# Patient Record
Sex: Male | Born: 1945 | Race: White | Hispanic: No | Marital: Married | Smoking: Former smoker
Health system: Southern US, Community
[De-identification: ages and names within clinical notes are randomized; demographics above are authoritative.]

## PROBLEM LIST (undated history)

## (undated) DIAGNOSIS — K625 Hemorrhage of anus and rectum: Secondary | ICD-10-CM

## (undated) DIAGNOSIS — R112 Nausea with vomiting, unspecified: Secondary | ICD-10-CM

## (undated) DIAGNOSIS — M1990 Unspecified osteoarthritis, unspecified site: Secondary | ICD-10-CM

## (undated) DIAGNOSIS — Z87442 Personal history of urinary calculi: Secondary | ICD-10-CM

## (undated) DIAGNOSIS — K219 Gastro-esophageal reflux disease without esophagitis: Secondary | ICD-10-CM

## (undated) DIAGNOSIS — I82409 Acute embolism and thrombosis of unspecified deep veins of unspecified lower extremity: Secondary | ICD-10-CM

## (undated) DIAGNOSIS — C4491 Basal cell carcinoma of skin, unspecified: Secondary | ICD-10-CM

## (undated) DIAGNOSIS — E785 Hyperlipidemia, unspecified: Secondary | ICD-10-CM

## (undated) DIAGNOSIS — Z9889 Other specified postprocedural states: Secondary | ICD-10-CM

## (undated) DIAGNOSIS — I10 Essential (primary) hypertension: Secondary | ICD-10-CM

## (undated) DIAGNOSIS — C61 Malignant neoplasm of prostate: Secondary | ICD-10-CM

## (undated) DIAGNOSIS — M87059 Idiopathic aseptic necrosis of unspecified femur: Secondary | ICD-10-CM

## (undated) DIAGNOSIS — D649 Anemia, unspecified: Secondary | ICD-10-CM

## (undated) HISTORY — PX: HERNIA REPAIR: SHX51

## (undated) HISTORY — PX: TONSILLECTOMY: SUR1361

## (undated) HISTORY — PX: COLONOSCOPY: SHX174

## (undated) NOTE — *Deleted (*Deleted)
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 Clinical nursing staff has updated patient's PMH/PSHx, current medication list, and drug allergies/intolerances to ensure comprehensive history available to assist in medical decision making as it pertains to the aforementioned surgical procedure and anticipated anesthetic course.   Clinical Discussion:  [name] is a 2 y.o. male who is submitted for pre-surgical anesthesia review and clearance prior to him undergoing the above procedure. Patient has never been a smoker. Pertinent PMH includes: HTN, HLD, DVT (on warfarin), anemia, GERD, prostate cancer (s/p XRT in 2018), OA, AVN (right hip).  Patient scheduled to undergo total hip arthroplasty on 09/20/2020 for treatment of AVN of the RIGHT femur and primary osteoarthritis.  Patient presented to PAT clinic on 09/20/2020 for preadmission/anesthesia testing.  During the course of his testing, patient had a twelve-lead ECG performed that showed a nonspecific IVCD with associated anterolateral ST and T wave abnormalities.  ECG was compared to last tracing on file from 10/13/2012, and these changes are new.  Patient advising that he has no previous cardiac history, and therefore has never seen a cardiologist; pertinent medical history as above.  Given the acute changes on his ECG, patient referral sent to [facility] cardiology for further evaluation and preoperative cardiac clearance. Additionally, patient makes mention of the fact that he is undergoing IVC filter placement on 09/23/2020 with Dr. Festus Barren (vascular surgery).  Communicated new ECG findings with vascular surgeon who advised that despite ECG changes, patient would be able to proceed a planned IVC filter placement without significant risk of complications; see previous notes.  Communication also sent to orthopedic surgeon Ernest Pine, MD) to make him aware of findings on ECG and subsequent referral to cardiology.   Patient underwent IVC filter placement on 09/23/2020.  In review of the procedural notes, device was placed with no complications and patient was discharged home in stable condition. Mr. Kotarski was also seen and initial consult by cardiology Darrold Junker, MD) on 09/23/2020; notes reviewed.  Patient denied angina or anginal equivalent symptoms.  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  Patient chronically anticoagulated with warfarin, and again, now has an IVC filter in place.  Patient is active, however does not exercise regularly.  Patient denies cardiac history.  Hypertension adequately  controlled on diuretic therapy (triamterene-HCTZ).  Both HTN and HLD are controlled with lifestyle/nutritional modifications.  Copy of ECG from PAT showing a nonspecific IVCD with associated ST-T wave changes reviewed by cardiologist; agreed with PAT interpretation.  Given the seemingly acute changes noted on the tracing, patient was scheduled for myocardial perfusion imaging to further assess.  Patient will follow up with cardiology following the scan to review results and discuss any potential interventions that may be needed.  Cardiac clearance for planned orthopedic surgery is pending results of the cardiovascular testing at this time.  Myocardial perfusion imaging study  has been scheduled for 09/26/2020. Communication was sent to surgeon's  APP Katrinka Blazing, PA-C) to update on patient status/condition, as APP will be seeing patient and clinic for preoperative evaluation on 09/24/2020.   Patient is followed by cardiology (***, MD). He was last seen in the cardiology clinic on ***; notes reviewed.  This patient is on daily anticoagulation therapy. He has been instructed on recommendations for holding his warfardin for 5 days prior to his procedure. The patient has been instructed that his last dose of his anticoagulant will be on 09/24/2020.  He denies previous perioperative complications with anesthesia. He underwent a general anesthetic course here (ASA II) in 06/2019 with no documented complications.   Vitals with BMI 09/23/2020 09/23/2020 09/23/2020  Height - - -  Weight - - -  BMI - - -  Systolic 139 138 191  Diastolic 66 72 62  Pulse 61 61 61    Providers/Specialists:   NOTE: Primary physician provider listed below. Patient may have been seen by APP or partner within same practice.   PROVIDER ROLE LAST OV  Hooten, Illene Labrador, MD Orthopedic (Surgeon) ***  Lynnea Ferrier, MD Primary Care Provider 04/04/2020  Marcina Millard, MD Cardiology ***   Allergies:  Codeine and Sulfa antibiotics  Current Home Medications:   No current facility-administered medications for this encounter.   . Cyanocobalamin 2500 MCG SUBL  . Glucosamine-Chondroitin (OSTEO BI-FLEX REGULAR STRENGTH PO)  . tamsulosin (FLOMAX) 0.4 MG CAPS capsule  . triamterene-hydrochlorothiazide (MAXZIDE-25) 37.5-25 MG tablet  . warfarin (COUMADIN) 4 MG tablet  . Multiple Vitamin (MULTIVITAMIN PO)   History:   Past Medical History:  Diagnosis Date  . Arthritis   . AVN of femur (HCC)    RIGHT  . DVT (deep venous thrombosis) (HCC)   . GERD (gastroesophageal reflux disease)   . Hyperlipemia   . Hypertension   . 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 Prostate cancer (HCC) 2018   Treated with radiation  . Rectal bleeding    2013, 2020   Past Surgical  History:  Procedure Laterality Date  . COLONOSCOPY N/A 07/02/2019   Procedure: COLONOSCOPY;  Surgeon: Toney Reil, MD;  Location: [facility] ENDOSCOPY;  Service: Gastroenterology;  Laterality: N/A;  . ESOPHAGOGASTRODUODENOSCOPY N/A 07/02/2019   Procedure: ESOPHAGOGASTRODUODENOSCOPY (EGD);  Surgeon: Toney Reil, MD;  Location: [facility] ENDOSCOPY;  Service: Gastroenterology;  Laterality: N/A;  . FOOT FRACTURE SURGERY Right 2012   Heel fracture repair/ has metal, fell off ladder  . HERNIA REPAIR  5/52005  . IVC FILTER INSERTION N/A 09/23/2020   Procedure: IVC FILTER INSERTION;  Surgeon: Annice Needy, MD;  Location: [facility] INVASIVE CV LAB;  Service: Cardiovascular;  Laterality: N/A;   Family History  Problem Relation Age of Onset  . Heart attack Father    Social History   Tobacco Use  . Smoking status: Never Smoker  . Smokeless tobacco: Never Used  Vaping Use  . Vaping Use: Never used  Substance Use Topics  . Alcohol use: Not Currently  . Drug use: Not Currently    Pertinent Clinical Results:  LABS: {CHL AN LABS REVIEWED:112001::"Labs reviewed: Acceptable for surgery."}  No visits with results within 3 Day(s) from this visit.  Latest known visit with results is:  Hospital Outpatient Visit on 09/20/2020  Component Date Value Ref Range Status  . CRP 09/20/2020 1.0* <1.0 mg/dL Final   Performed at [facility], [address] [place], [place] [address]  . Sed Rate 09/20/2020 5  0 - 20 mm/hr Final   Performed at [facility], [address] [place] [place], [place] [address]  . MRSA, PCR 09/20/2020 NEGATIVE  NEGATIVE Final  . Staphylococcus aureus 09/20/2020 NEGATIVE  NEGATIVE Final   Comment: (NOTE) The Xpert SA Assay (FDA approved for NASAL specimens in patients 91 years of age and older), is one component of a comprehensive surveillance program. It is not intended to diagnose infection nor to guide or monitor treatment. Performed at [facility], [address], [place], [place] [address]   . aPTT 09/20/2020 46* 24 - 36 seconds Final   Comment:        IF BASELINE aPTT IS ELEVATED, SUGGEST PATIENT RISK ASSESSMENT BE USED TO DETERMINE APPROPRIATE ANTICOAGULANT THERAPY. Performed at [facility], [address], [place], [place] [address]   . WBC 09/20/2020 5.0  4.0 - 10.5 K/uL Final  . RBC 09/20/2020 4.62  4.22 - 5.81 MIL/uL Final  . Hemoglobin 09/20/2020 14.2  13.0 - 17.0 g/dL Final  . HCT 24/40/1027 42.1  39 - 52 % Final  . MCV 09/20/2020 91.1  80.0 - 100.0 fL Final  . MCH 09/20/2020 30.7  26.0 - 34.0 pg Final  . MCHC 09/20/2020 33.7  30.0 - 36.0 g/dL Final  . RDW 25/36/6440 14.0  11.5 - 15.5 % Final  . Platelets 09/20/2020 228  150 - 400 K/uL Final  . nRBC 09/20/2020 0.0  0.0 - 0.2 % Final   Performed at [facility], [address] [place], [place] [address]  . Sodium 09/20/2020 140  135 - 145 mmol/L Final  . Potassium 09/20/2020 3.7  3.5 - 5.1 mmol/L Final  . Chloride 09/20/2020 106  98 - 111 mmol/L Final  . CO2 09/20/2020 27  22 - 32 mmol/L Final  . Glucose, Bld 09/20/2020 100* 70 - 99 mg/dL Final   Glucose reference range applies only to samples taken after fasting for at least 8 hours.  . BUN 09/20/2020 17  8 - 23 mg/dL Final  . Creatinine, Ser 09/20/2020 1.04  0.61 - 1.24 mg/dL Final  . Calcium 59/56/3875 9.2  8.9 - 10.3 mg/dL Final  . Total Protein 09/20/2020 6.9  6.5 - 8.1 g/dL Final  . Albumin 64/33/2951 4.2  3.5 - 5.0 g/dL Final  . AST 88/41/6606 19  15 - 41 U/L Final  . ALT 09/20/2020 15  0 - 44 U/L Final  . Alkaline Phosphatase 09/20/2020 58  38 - 126 U/L Final  . Total Bilirubin 09/20/2020 0.8  0.3 - 1.2 mg/dL Final  . GFR, Estimated 09/20/2020 >60  >60 mL/min Final   Comment: (NOTE) Calculated using the CKD-EPI Creatinine Equation (2021)   . Anion gap 09/20/2020 7  5 - 15 Final   Performed at [facility], [address] [place] [place], [place] [address]  . Prothrombin Time  09/20/2020 27.2* 11.4 - 15.2 seconds Final  . INR 09/20/2020 2.6* 0.8 - 1.2 Final   Comment: (NOTE) INR goal varies based on device and disease states. Performed at [facility], [address], [place], [place] [address]   . ABO/RH(D) 09/20/2020 O POS   Final  . Antibody Screen 09/20/2020 NEG   Final  . Sample Expiration 09/20/2020 10/04/2020,2359   Final  . Extend sample reason 09/20/2020    Final                   Value:NO TRANSFUSIONS OR PREGNANCY IN THE PAST 3 MONTHS Performed  at [facility], [address] [place], [place] [address]   . Color, Urine 09/20/2020 YELLOW* YELLOW Final  . APPearance 09/20/2020 CLEAR* CLEAR Final  . Specific Gravity, Urine 09/20/2020 1.018  1.005 - 1.030 Final  . pH 09/20/2020 7.0  5.0 - 8.0 Final  . Glucose, UA 09/20/2020 NEGATIVE  NEGATIVE mg/dL Final  . Hgb urine dipstick 09/20/2020 NEGATIVE  NEGATIVE Final  . Bilirubin Urine 09/20/2020 NEGATIVE  NEGATIVE Final  . Ketones, ur 09/20/2020 NEGATIVE  NEGATIVE mg/dL Final  . Protein, ur 08/65/7846 NEGATIVE  NEGATIVE mg/dL Final  . Nitrite 96/29/5284 NEGATIVE  NEGATIVE Final  . Glori Luis 09/20/2020 NEGATIVE  NEGATIVE Final   Performed at [facility], [address] [place], [place] [address]  . Specimen Description 09/20/2020    Final                   Value:URINE, RANDOM Performed at [facility], [address] [place] [place], [place] [address]   . Special Requests 09/20/2020    Final                   Value:Normal Performed at [facility], [address] [place] [place], [place] [address]   . Culture 09/20/2020    Final                   Value:NO GROWTH Performed at [facility], [address], [place], [place] [address]   . Report Status 09/20/2020 09/22/2020 FINAL   Final    ECG: Date: 09/20/2020 Time ECG obtained: 1445 PM Rate: 65 bpm Rhythm: NSR; nonspecific IVCD Intervals: PR 176 ms. QRS 136 ms. QTc 443 ms. ST segment and T wave  changes: Inferolateral ST-T wave abnormalities Comparison: Acute changes noted from last tracing performed on 10/13/2012   IMAGING / PROCEDURES: ECHOCARDIOGRAM done on *** LEXISCAN done on *** CT *** done on ***  Impression and Plan:  Asani A Rogalski has been referred for pre-anesthesia review and clearance prior to him undergoing the planned anesthetic and procedural courses. Available labs, pertinent testing, and imaging results were personally reviewed by me. This patient has been appropriately cleared by ***.   Based on clinical review performed today (09/24/20), barring any significant acute changes in the patient's overall condition, it is anticipated that he will be able to proceed with the planned surgical intervention. Any acute changes in clinical condition may necessitate his procedure being postponed and/or cancelled. Pre-surgical instructions were reviewed with the patient during his PAT appointment and questions were fielded by PAT clinical staff.  Quentin Mulling, MSN, APRN, FNP-C, CEN [facility]  Peri-operative Services Nurse Practitioner Phone: [phone] 09/24/20 9:00 AM  NOTE: This note has been prepared using Dragon dictation software. Despite my best ability to proofread, there is always the potential that unintentional transcriptional errors may still occur from this process.

---
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
## 2003-11-17 DIAGNOSIS — I82409 Acute embolism and thrombosis of unspecified deep veins of unspecified lower extremity: Secondary | ICD-10-CM

## 2003-11-17 HISTORY — PX: HERNIA REPAIR: SHX51

## 2003-11-17 HISTORY — DX: Acute embolism and thrombosis of unspecified deep veins of unspecified lower extremity: I82.409

## 2004-06-04 ENCOUNTER — Other Ambulatory Visit: Payer: Self-pay

## 2005-05-29 ENCOUNTER — Inpatient Hospital Stay: Payer: Self-pay | Admitting: Internal Medicine

## 2005-06-22 ENCOUNTER — Ambulatory Visit: Payer: Self-pay | Admitting: Urology

## 2006-01-14 IMAGING — US US EXTREM LOW VENOUS*L*
1 series · 18 of 23 positions shown · non-contrast
Comparison: none

REASON FOR EXAM: LL extremity pain. History of DVT, possible recurrence
COMMENTS:

PROCEDURE:     US  - US DOPPLER LOW EXTR LEFT  - June 22, 2005  [DATE]
RESULT:     There is noted flow throughout the deep venous system.  The
veins are compressible and respond to appropriate augmentation.  No clot is
identified.

[Series 1: us extrem low venous*left* · 18 of 23 slices shown]
[im 1/23]
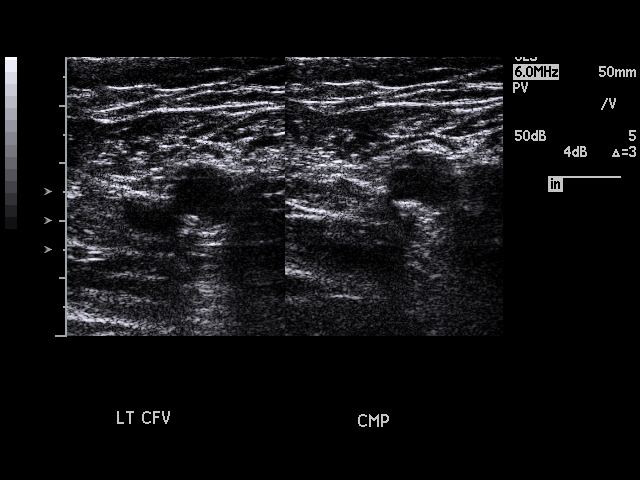
[im 2/23]
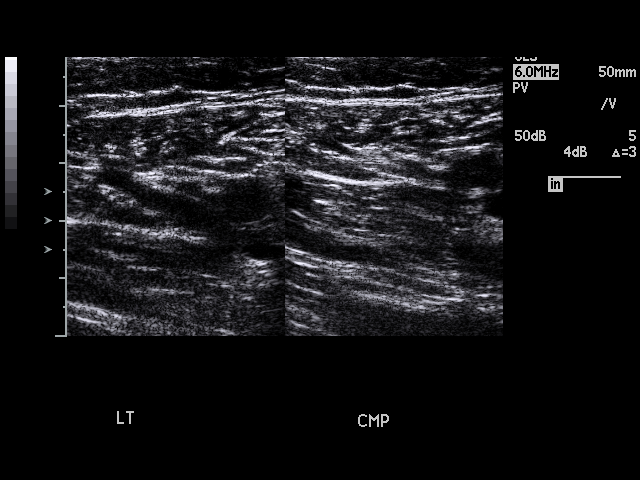
[im 4/23]
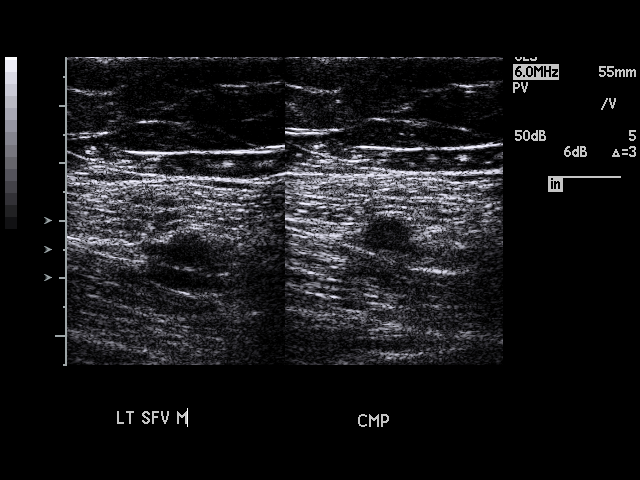
[im 5/23]
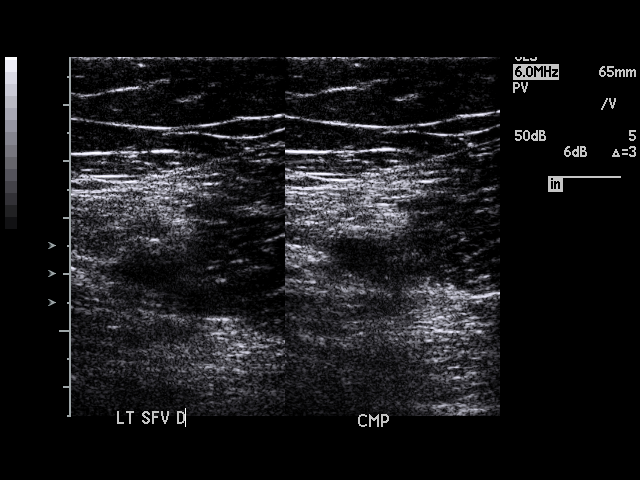
[im 6/23]
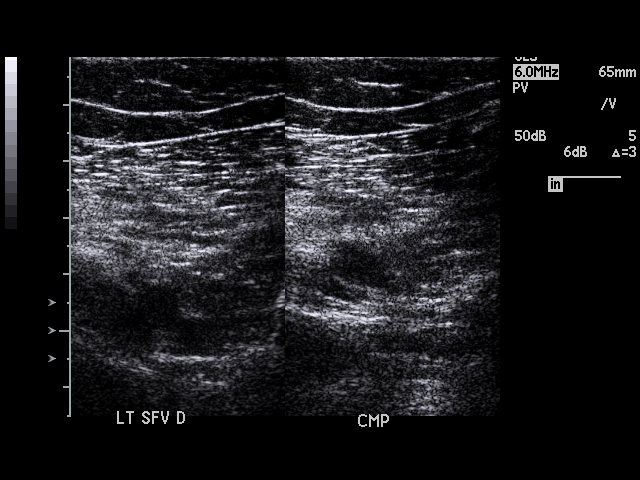
[im 8/23]
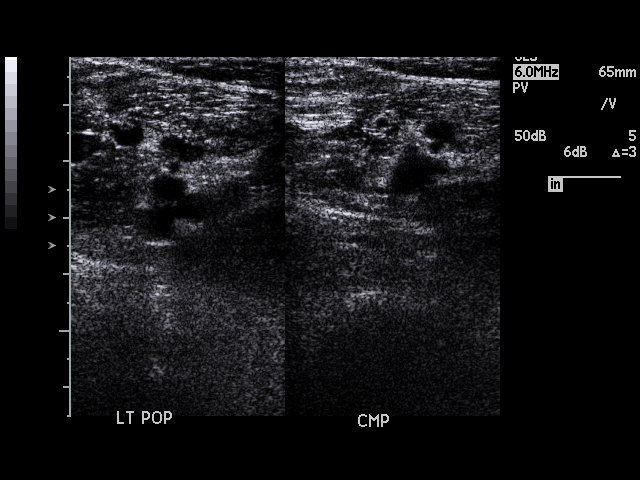
[im 9/23]
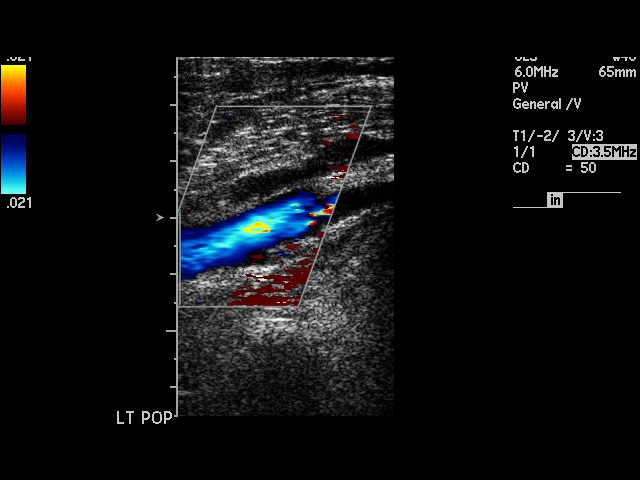
[im 10/23]
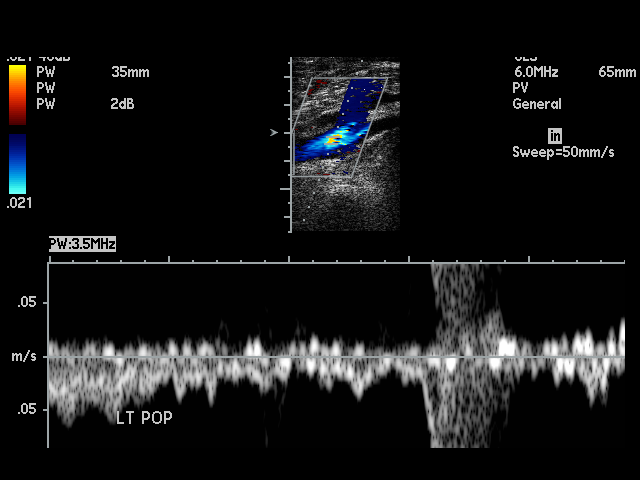
[im 11/23]
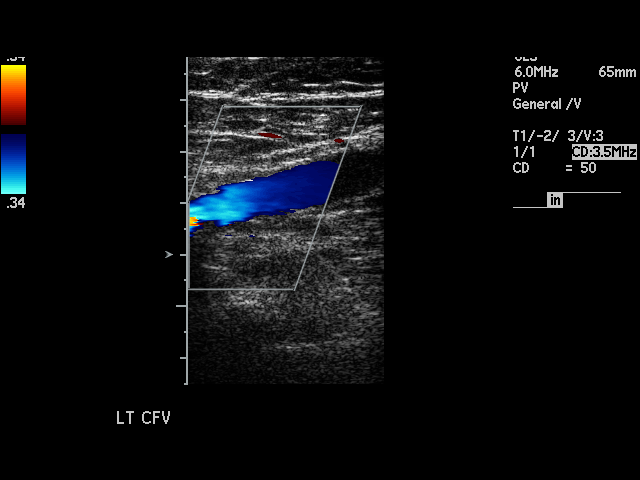
[im 13/23]
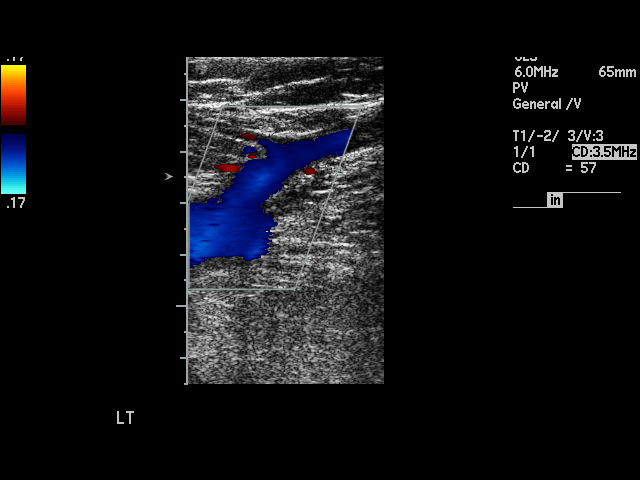
[im 14/23]
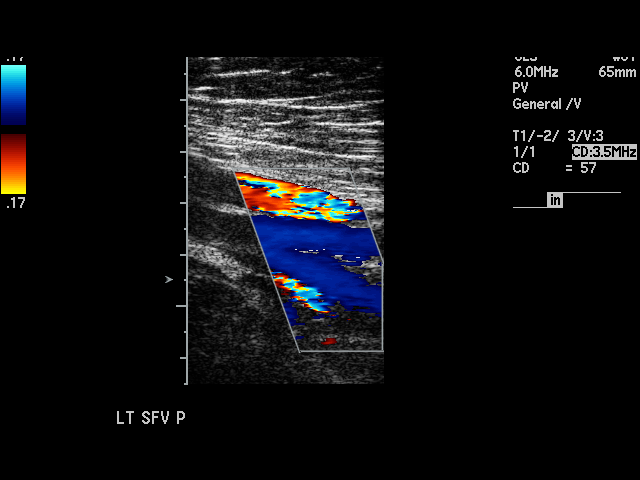
[im 15/23]
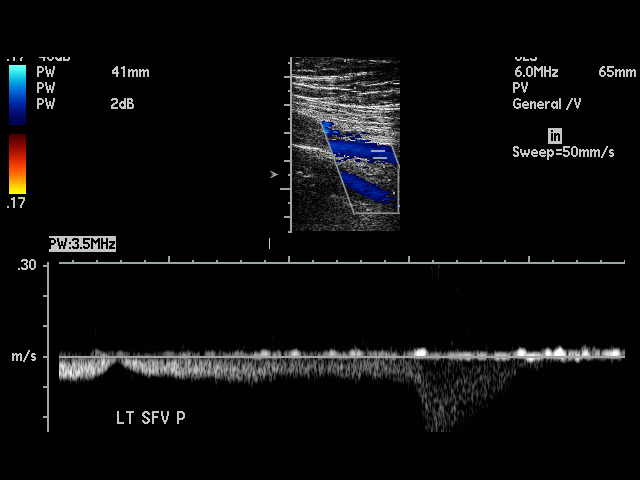
[im 16/23]
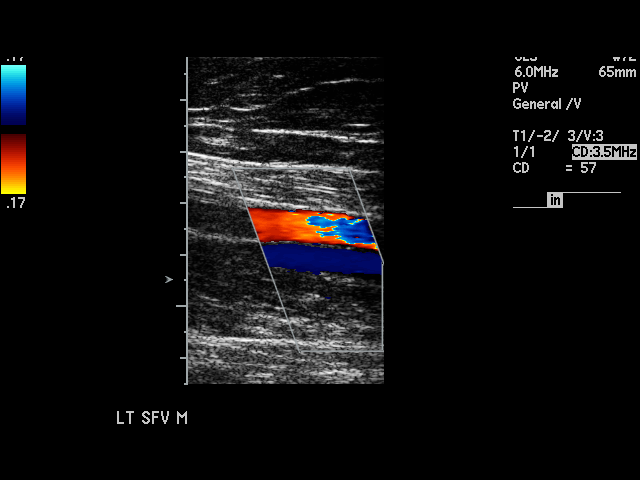
[im 18/23]
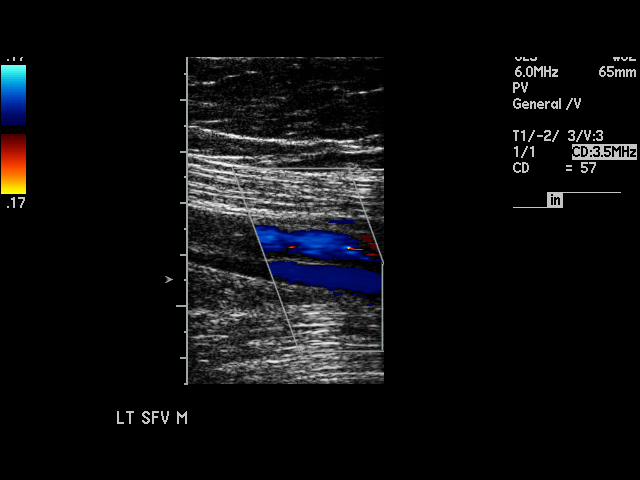
[im 19/23]
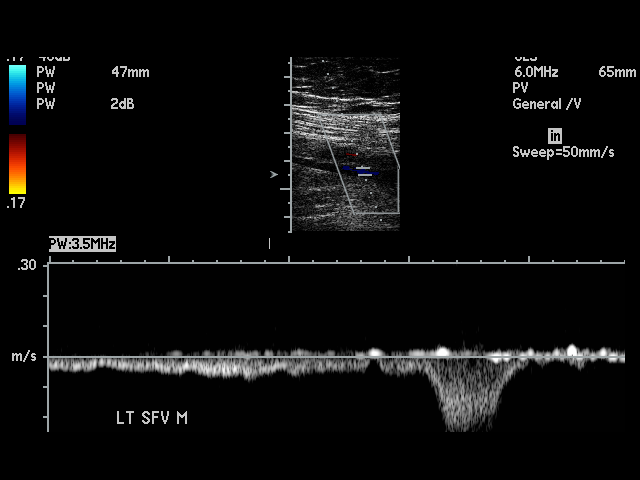
[im 20/23]
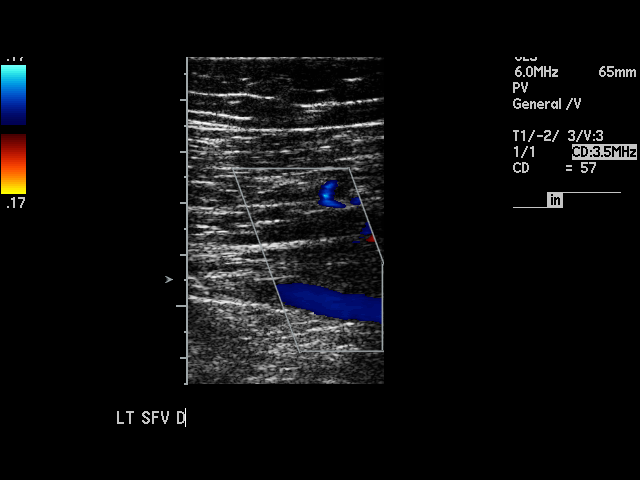
[im 22/23]
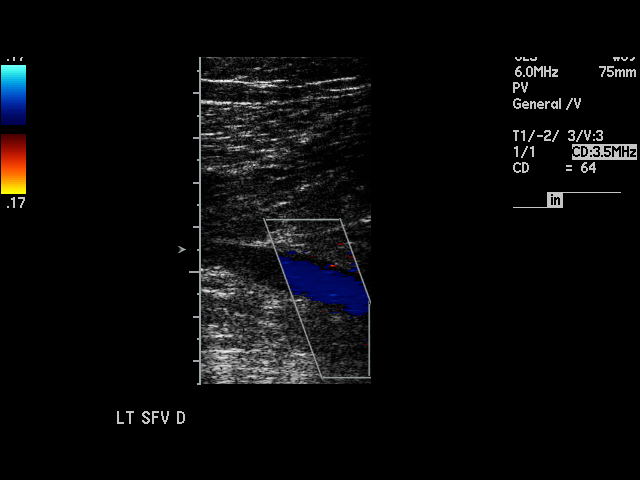
[im 23/23]
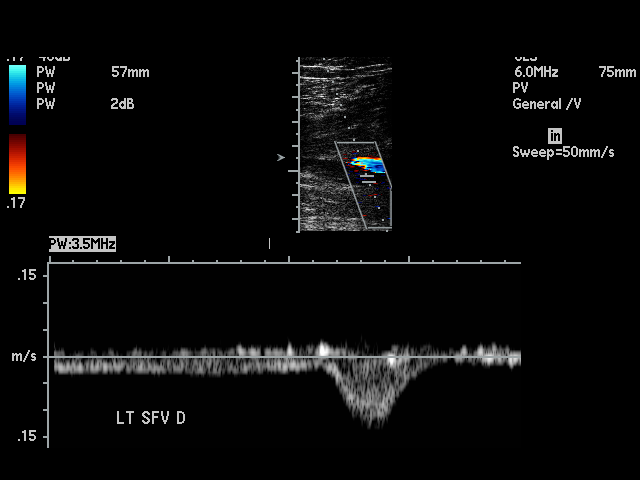

[18 of 23 positions shown; findings below may reference images not displayed]

IMPRESSION: No evidence of deep venous thrombosis noted in the LEFT lower extremity.

## 2008-05-07 ENCOUNTER — Ambulatory Visit: Payer: Self-pay | Admitting: Physician Assistant

## 2008-09-12 ENCOUNTER — Ambulatory Visit: Payer: Self-pay

## 2008-11-05 ENCOUNTER — Ambulatory Visit: Payer: Self-pay | Admitting: Family Medicine

## 2008-11-07 ENCOUNTER — Ambulatory Visit: Payer: Self-pay | Admitting: Family Medicine

## 2009-01-24 ENCOUNTER — Ambulatory Visit: Payer: Self-pay | Admitting: Internal Medicine

## 2010-06-11 ENCOUNTER — Ambulatory Visit: Payer: Self-pay | Admitting: Gastroenterology

## 2010-11-14 ENCOUNTER — Emergency Department: Payer: Self-pay | Admitting: Emergency Medicine

## 2010-11-16 HISTORY — PX: FOOT FRACTURE SURGERY: SHX645

## 2010-11-28 ENCOUNTER — Ambulatory Visit: Payer: Self-pay | Admitting: Podiatry

## 2011-11-17 HISTORY — PX: ESOPHAGOGASTRODUODENOSCOPY: SHX1529

## 2012-10-13 ENCOUNTER — Inpatient Hospital Stay: Payer: Self-pay

## 2012-10-13 LAB — COMPREHENSIVE METABOLIC PANEL
BUN: 28 mg/dL — ABNORMAL HIGH (ref 7–18)
Calcium, Total: 8.2 mg/dL — ABNORMAL LOW (ref 8.5–10.1)
Chloride: 110 mmol/L — ABNORMAL HIGH (ref 98–107)
Co2: 26 mmol/L (ref 21–32)
EGFR (African American): >60
Glucose: 102 mg/dL — ABNORMAL HIGH (ref 65–99)
Osmolality: 291 (ref 275–301)
SGOT(AST): 20 U/L (ref 15–37)
SGPT (ALT): 19 U/L (ref 12–78)
Sodium: 143 mmol/L (ref 136–145)
Total Protein: 6 g/dL — ABNORMAL LOW (ref 6.4–8.2)

## 2012-10-13 LAB — CBC
HCT: 35.1 % — ABNORMAL LOW (ref 40.0–52.0)
HGB: 11.5 g/dL — ABNORMAL LOW (ref 13.0–18.0)
MCH: 29.4 pg (ref 26.0–34.0)
MCV: 90 fL (ref 80–100)
RBC: 3.91 10*6/uL — ABNORMAL LOW (ref 4.40–5.90)
WBC: 7.1 10*3/uL (ref 3.8–10.6)

## 2012-10-13 LAB — URINALYSIS, COMPLETE
Bilirubin,UR: NEGATIVE
Ketone: NEGATIVE
Leukocyte Esterase: NEGATIVE
Nitrite: NEGATIVE
Ph: 6 (ref 4.5–8.0)
RBC,UR: 1 /HPF (ref 0–5)

## 2012-10-13 LAB — HEMOGLOBIN
HGB: 10.1 g/dL — ABNORMAL LOW (ref 13.0–18.0)
HGB: 10.1 g/dL — ABNORMAL LOW (ref 13.0–18.0)

## 2012-10-13 LAB — PROTIME-INR
INR: 2.1
Prothrombin Time: 24 secs — ABNORMAL HIGH (ref 11.5–14.7)

## 2012-10-13 LAB — APTT: Activated PTT: 41.6 secs — ABNORMAL HIGH (ref 23.6–35.9)

## 2012-10-14 LAB — CBC WITH DIFFERENTIAL/PLATELET
Basophil %: 1.1 %
Eosinophil #: 0.1 10*3/uL (ref 0.0–0.7)
Eosinophil %: 1.1 %
HGB: 9.6 g/dL — ABNORMAL LOW (ref 13.0–18.0)
Lymphocyte #: 2.7 10*3/uL (ref 1.0–3.6)
MCH: 31.4 pg (ref 26.0–34.0)
MCHC: 35.2 g/dL (ref 32.0–36.0)
MCV: 89 fL (ref 80–100)
Monocyte #: 0.5 x10 3/mm (ref 0.2–1.0)
Neutrophil #: 3.6 10*3/uL (ref 1.4–6.5)
Neutrophil %: 51.6 %
Platelet: 221 10*3/uL (ref 150–440)
RBC: 3.04 10*6/uL — ABNORMAL LOW (ref 4.40–5.90)
WBC: 6.9 10*3/uL (ref 3.8–10.6)

## 2012-10-14 LAB — BASIC METABOLIC PANEL
Anion Gap: 5 — ABNORMAL LOW (ref 7–16)
Calcium, Total: 8 mg/dL — ABNORMAL LOW (ref 8.5–10.1)
Creatinine: 0.91 mg/dL (ref 0.60–1.30)
EGFR (African American): >60
EGFR (Non-African Amer.): >60
Glucose: 93 mg/dL (ref 65–99)
Osmolality: 286 (ref 275–301)
Sodium: 143 mmol/L (ref 136–145)

## 2012-10-14 LAB — PROTIME-INR: INR: 1.2

## 2012-10-14 LAB — HEMOGLOBIN: HGB: 9.9 g/dL — ABNORMAL LOW (ref 13.0–18.0)

## 2012-10-15 LAB — CBC WITH DIFFERENTIAL/PLATELET
Basophil #: 0.1 10*3/uL (ref 0.0–0.1)
Basophil %: 1.3 %
Eosinophil %: 1.5 %
HCT: 27.3 % — ABNORMAL LOW (ref 40.0–52.0)
HGB: 9.1 g/dL — ABNORMAL LOW (ref 13.0–18.0)
Lymphocyte #: 3 10*3/uL (ref 1.0–3.6)
MCH: 29.8 pg (ref 26.0–34.0)
MCHC: 33.4 g/dL (ref 32.0–36.0)
MCV: 89 fL (ref 80–100)
Monocyte #: 0.5 x10 3/mm (ref 0.2–1.0)
Monocyte %: 6.6 %
Neutrophil #: 3.3 10*3/uL (ref 1.4–6.5)
Platelet: 224 10*3/uL (ref 150–440)
RBC: 3.06 10*6/uL — ABNORMAL LOW (ref 4.40–5.90)

## 2012-10-15 LAB — BASIC METABOLIC PANEL
Anion Gap: 7 (ref 7–16)
BUN: 13 mg/dL (ref 7–18)
Calcium, Total: 8.5 mg/dL (ref 8.5–10.1)
Chloride: 112 mmol/L — ABNORMAL HIGH (ref 98–107)
Creatinine: 0.97 mg/dL (ref 0.60–1.30)
EGFR (Non-African Amer.): >60
Osmolality: 287 (ref 275–301)
Potassium: 4.3 mmol/L (ref 3.5–5.1)
Sodium: 144 mmol/L (ref 136–145)

## 2012-10-28 ENCOUNTER — Ambulatory Visit: Payer: Self-pay | Admitting: Unknown Physician Specialty

## 2014-03-30 DIAGNOSIS — I80209 Phlebitis and thrombophlebitis of unspecified deep vessels of unspecified lower extremity: Secondary | ICD-10-CM | POA: Insufficient documentation

## 2015-03-05 NOTE — Discharge Summary (Signed)
PATIENT NAME:  [name] [name] MR#:  [id number] DATE OF BIRTH:  1946-10-05  DATE OF ADMISSION:  10/13/2012 DATE OF DISCHARGE:  10/15/2012   PRIMARY CARE PHYSICIAN: Ramonita Lab, MD   CONSULTING PHYSICIAN: Dr. Dionne Milo in GI   DISCHARGE DIAGNOSES:  1. Lower GI bleed, likely diverticular.  2. 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  3. Hypertension.  4. Benign prostatic hypertrophy.   HISTORY OF PRESENT ILLNESS: Please see admission history and physical from 10/13/2012. Basically he developed lower GI bleed with bright red blood per rectum. It was relatively painless and followed an episode of constipation. He was admitted. His INR was up at 2.1 due to the Coumadin he takes chronically. This was reversed with Vitamin K and he was admitted for further evaluation.  HOSPITAL COURSE BY ISSUES:  1. Lower GI bleed. He has a history of diverticulosis on prior colonoscopy in 2011. He was seen by Dr. Dionne Milo who recommended a bleeding scan. Bleeding scan was done on November 29th but it was negative as the bleeding had ceased. He has had no further bleeding since admission and his hemoglobin has stabilized. He is tolerating a full liquid diet at this point and is requesting discharge home.  2. History of DVT. His last DVT was five years ago. He has been on chronic anticoagulation since then. We discussed the risks and benefits and we will hold his Coumadin at this point given the risk of the GI bleed versus the risk of recurrent DVT. His discharge INR was 1.2. He will be mobilized in order to prevent recurrent DVTs. He knows to return if he has recurrent leg pain or swelling or if he has any further bright red blood per rectum.   PROCEDURES: Only procedure done was the bleeding scan which was negative for active bleeding.   LABS: Discharge white count 7.0, hemoglobin 9.1 which is down from 11.5, platelets 224. Discharge INR 1.2.   DISCHARGE INSTRUCTIONS: The patient is to call the hospital  on call line or go to the ED if he has recurrent bleeding, abdominal pain, dizziness, blood clot, swelling in the legs, chest pain, or other concerning symptoms.   DISCHARGE FOLLOW-UP: The patient will follow-up with Dr. Caryl Comes in 1 to 2 weeks to have his hemoglobin checked as well as to have discussion about whether to restart the Coumadin.   DISCHARGE MEDICATIONS: The patient will resume his outpatient medications including:   1. Flomax 0.4 mg once a day.  2. Avodart 0.5 mg once a day.  3. Imipramine 25 mg once a day.  4. Multivitamin once a day.  5. Osteo Bi-Flex 2 times a day.  6. He was also instructed to get over-the-counter iron tablets and start those. He is to make sure his stool stay soft and can use a stool softener as needed.   DO NOT TAKE: He will stop the warfarin until follow-up.   DISCHARGE DIET: Regular.   DISCHARGE ACTIVITY: As tolerated.   DISCHARGE TIME: This discharge took 35 minutes.   ____________________________ Cheral Marker. Ola Spurr, MD dpf:drc D: 10/15/2012 11:14:32 ET T: 10/15/2012 11:32:59 ET JOB#: [id number]  cc: Cheral Marker. Ola Spurr, MD, <Dictator> Tama High III, MD [facility] MD ELECTRONICALLY SIGNED 10/18/2012 21:44

## 2015-03-05 NOTE — H&P (Signed)
PATIENT NAME:  [name] [name], [name] [name] MR#:  [id number] DATE OF BIRTH:  Mar 18, 1946  DATE OF ADMISSION:  10/13/2012  REFERRING PHYSICIAN: Pollie Friar, MD   PRIMARY CARE PHYSICIAN: Ramonita Lab, MD    CHIEF COMPLAINT: Lower GI bleed.   HISTORY OF PRESENT ILLNESS: The patient is a pleasant 69 year old Caucasian male with history of BPH, history of DVT x2. Initial DVT was three days postop after a hernia surgery around 2005. The second DVT was in the same the lower extremity around 2007. Since then he has been on chronic Coumadin therapy. The patient presented with acute lower GI bleed starting about 5 a.m. this morning. The patient has some chronic constipation. Of note, the patient has had a colonoscopy per him in 2011. There is a history of diverticulosis. The patient woke up early this morning, had to go use the bathroom and was straining. He had two bowel movements but there was no light and he did not see any bleed. Afterwards the patient had three additional bowel movements all bloody. It is unclear if he had bloody bowel movement with the first two episodes. He presented to the hospital where he had another episode of bloody bowel movement here. Currently he is not tachycardic. There is no acute bleed now. His INR is therapeutic at 2.1. PT and PTT is elevated as well. He has no abdominal pain. No fevers. He has no upper GI bleed. Hospitalist services were contacted for further evaluation and management. Dr. Dionne Milo from GI has also been consulted and he is aware of the patient. A Nuclear Medicine study has been ordered but it is questionable if we have adequate contrast here in-house today.   PAST MEDICAL HISTORY:  1. Gastroesophageal reflux disease.   2. Degenerative disk disease. 3. Skin cancer.  4. History of DVT as above.  5. BPH. 6. Right heel surgery.   OUTPATIENT MEDICATIONS:  1. Avodart 0.5 mg daily.  2. Flomax 0.4 mg daily.   3. Imipramine 25 mg daily.  4. Multivitamin 1 tab daily.  5. Osteo  Bi-Flex 2 times a day.  6. Warfarin 6 mg once a day.   FAMILY HISTORY: Mom died of old age, had some abdominal surgeries, died at the age of 16. Dad with heart attack.   ALLERGIES: Codeine causes nausea.   SOCIAL HISTORY: No tobacco, alcohol, or drug use.  REVIEW OF SYSTEMS: CONSTITUTIONAL: No fever. Positive for fatigue while he was having a bowel movement, resolved now. No weight changes. EYES: No blurry vision or double vision. ENT: No tinnitus or hearing loss. Had some sinus drainage recently. RESPIRATORY: No cough, wheezing, shortness of breath, or painful respiration. CARDIOVASCULAR: No chest pain, orthopnea, arrhythmia, palpitations, or syncope. GI: No nausea, vomiting, or diarrhea. Positive for abdominal pain earlier in the morning on the left side, currently resolved. Positive for GI bleed as above. No history of prior GI bleeds. GU: No dysuria or hematuria. Positive for BPH. ENDOCRINE: No polyuria, nocturia, or thyroid problems. HEME/LYMPH: No anemia history or easy bruising. SKIN: No new rashes. Chronic venous changes and darkened skin on the left leg. MUSCULOSKELETAL: Denies arthritis currently. Has history of degenerative disk disease. NEUROLOGIC: No numbness or weakness focally. PSYCHIATRIC: No anxiety or insomnia.   PHYSICAL EXAMINATION:   VITAL SIGNS: Temperature on arrival 97.8, pulse rate 81, respiratory rate 20, blood pressure on arrival 132/67, last one 141/75, oxygen sat 99% on room air.   GENERAL: The patient is a well developed Caucasian male laying in bed in  no obvious distress talking in full sentences.   HEENT: Normocephalic, atraumatic. Pupils are equal and reactive. Anicteric sclerae. Moist mucous membranes.   NECK: Supple. No thyroid tenderness. No cervical lymphadenopathy.   CARDIOVASCULAR: S1, S2 regular rate and rhythm. No murmurs, rubs, or gallops.   LUNGS: Clear to auscultation. No wheezing or rhonchi. Good effort.   ABDOMEN: Soft, nontender. No organomegaly  appreciated. Positive bowel sounds in all quadrants. No significant tenderness on palpation.   EXTREMITIES: No significant lower extremity edema.   SKIN: Some chronic venostasis changes on the left lower extremity.   NEUROLOGICAL: Cranial nerves II through XII grossly intact. Strength 5/5 in all extremities. Sensation intact to light touch.   PSYCH: Awake, alert, oriented x3. Pleasant, cooperative, conversant.   LABORATORY, DIAGNOSTIC, AND RADIOLOGICAL DATA: BUN 28, creatinine 1.08, sodium 143, potassium 4.3, chloride 110, glucose 102. LFTs total protein is 6, otherwise within normal limits. WBC 7, hemoglobin 11.5, hematocrit 35.1, platelets 251. INR 2.1. PT 24. APTT 41.6. Urinalysis not suggestive of infection.   EKG normal sinus rhythm, rate is 80, nonspecific T wave changes.   ASSESSMENT AND PLAN: We have a pleasant 69 year old male with history of DVT x2, the first provoked in the setting of surgery, second one unprovoked, last DVT was in 2007, presents with acute lower GI bleed and short episode of abdominal pain, now resolved. The patient has had multiple episodes of bleeding, the last one of which happened in the ER and was documented. The patient also has pictures of his bloody bowel movement. At this point he is hemodynamically stable, not tachycardic. He is not actively bleeding. Would admit the patient to the hospital. We would hold the Coumadin and give a dose of Vitamin K 2.5 mg p.o. as last DVT was over five years ago. Would also consider FFP. The cause of this likely is diverticular bleed. He has had a colonoscopy in 2011 which showed diverticulosis in the sigmoid colon. GI is on board. Would see if the Nuclear Medicine bleeding scan could be done. Would consult Vascular Surgery if the bleeding is recurrent as well. Would start patient on a clear diet and check hemoglobin every six hours at this point. I have already obtained consent and explained the risks and benefits of blood  transfusion and he is aware and he has signed the consent form. Would continue his BPH medications. Start him on SCDs and TEDs for DVT prophylaxis.     CODE STATUS: The patient is FULL CODE.   ____________________________ Vivien Presto, MD sa:drc D: 10/13/2012 11:03:35 ET T: 10/13/2012 11:29:23 ET JOB#: [id number]  cc: Vivien Presto, MD, <Dictator> Adin Hector, MD Vivien Presto MD ELECTRONICALLY SIGNED 10/27/2012 11:12

## 2015-03-05 NOTE — Consult Note (Signed)
Chief Complaint:   Subjective/Chief Complaint No bleeding or BM today. H and H stable after initial drop. Bleeding scan is negative.   VITAL SIGNS/ANCILLARY NOTES: **Vital Signs.:   29-Nov-13 13:49   Vital Signs Type Routine   Temperature Temperature (F) 97.8   Celsius 36.5   Temperature Source Oral   Pulse Pulse 85   Respirations Respirations 18   Systolic BP Systolic BP 314   Diastolic BP (mmHg) Diastolic BP (mmHg) 75   Mean BP 93   Pulse Ox % Pulse Ox % 99   Pulse Ox Activity Level  At rest   Oxygen Delivery Room Air/ 21 %   Lab Results: Routine Chem:  29-Nov-13 04:07    Glucose, Serum 93   BUN 16   Creatinine (comp) 0.91   Sodium, Serum 143   Potassium, Serum 4.1   Chloride, Serum  112   CO2, Serum 26   Calcium (Total), Serum  8.0   Anion Gap  5   Osmolality (calc) 286   eGFR (African American) >60   eGFR (Non-African American) >60 (eGFR values <44m/min/1.73 m2 may be an indication of chronic kidney disease (CKD). Calculated eGFR is useful in patients with stable renal function. The eGFR calculation will not be reliable in acutely ill patients when serum creatinine is changing rapidly. It is not useful in  patients on dialysis. The eGFR calculation may not be applicable to patients at the low and high extremes of body sizes, pregnant women, and vegetarians.)  Routine Coag:  29-Nov-13 04:07    Prothrombin  15.9   INR 1.2 (INR reference interval applies to patients on anticoagulant therapy. A single INR therapeutic range for coumarins is not optimal for all indications; however, the suggested range for most indications is 2.0 - 3.0. Exceptions to the INR Reference Range may include: Prosthetic heart valves, acute myocardial infarction, prevention of myocardial infarction, and combinations of aspirin and anticoagulant. The need for a higher or lower target INR must be assessed individually. Reference: The Pharmacology and Management of the Vitamin K   antagonists: the seventh ACCP Conference on Antithrombotic and Thrombolytic Therapy. CHFWYO.3785Sept:126 (3suppl): [id number] A HCT value >55% may artifactually increase the PT.  In one study,  the increase was an average of 25%. Reference:  "Effect on Routine and Special Coagulation Testing Values of Citrate Anticoagulant Adjustment in Patients with High HCT Values." American Journal of Clinical Pathology 2006;126:400-405.)  Routine Hem:  29-Nov-13 04:07    Hemoglobin (CBC)  9.6   WBC (CBC) 6.9   RBC (CBC)  3.04   Hematocrit (CBC)  27.2   Platelet Count (CBC) 221   MCV 89   MCH 31.4   MCHC 35.2   RDW  14.8   Neutrophil % 51.6   Lymphocyte % 39.6   Monocyte % 6.6   Eosinophil % 1.1   Basophil % 1.1   Neutrophil # 3.6   Lymphocyte # 2.7   Monocyte # 0.5   Eosinophil # 0.1   Basophil # 0.1 (Result(s) reported on 14 Oct 2012 at 06:04AM.)    15:42    Hemoglobin (CBC)  9.9 (Result(s) reported on 14 Oct 2012 at 04:19PM.)   Assessment/Plan:  Assessment/Plan:   Assessment Diverticular bleed, resolved. Mild blood loss anemia. High INR is now normal.    Plan Continue to hold Coumadin for 10-14 days, longer if clinically acceptable. Full liquid diet.  If no further bleeding may advance to soft diet and DC home probably tomorrow. If remains  in the hospital, Dr. Vira Agar will follow over the weekend.   Electronic Signatures: Jill Side (MD)  (Signed [phone] 17:20)  Authored: Chief Complaint, VITAL SIGNS/ANCILLARY NOTES, Lab Results, Assessment/Plan   Last Updated: 29-Nov-13 17:20 by Jill Side (MD)

## 2015-03-05 NOTE — Consult Note (Signed)
CC: LGI bleeding.  No further bleeding or bowel movement.  I advised pt to stay on special diet of "toothless diet" to lessen chance of stool knocking off clot from whatever diverticulum that bled.  Explained to him he may see dark stools for a few days even with a little deep marroon colon.  Avoid all NSAID. Follow up as needed, had colonoscopy 2 years ago.  Electronic Signatures: Manya Silvas (MD)  (Signed on 30-Nov-13 11:41)  Authored  Last Updated: 09-KHV-74 11:41 by Manya Silvas (MD)

## 2015-03-05 NOTE — Consult Note (Signed)
Brief Consult Note: Diagnosis: Lower GI bleed most likely diverticular.   Patient was seen by consultant.   Comments: Probably diverticular bleed. Clinically stable and no signs of active bleeding at this time. Mild coagulopathy.  Recommendations: Clear liquid diet. Follow H and H If signs of significant active bleeding consider FFP. Will follow.  Electronic Signatures: Jill Side (MD)  (Signed [phone] 12:49)  Authored: Brief Consult Note   Last Updated: 28-Nov-13 12:49 by Jill Side (MD)

## 2015-03-05 NOTE — Consult Note (Signed)
PATIENT NAME:  [name] [name], [name] [name] MR#:  [id number] DATE OF BIRTH:  May 24, 1946  DATE OF CONSULTATION:  10/14/2012  CONSULTING PHYSICIAN:  Jill Side, MD PRIMARY CARE PHYSICIAN: Ramonita Lab, III, MD  REASON FOR CONSULTATION: Lower gastrointestinal bleed.   HISTORY OF PRESENT ILLNESS: A 69 year old male with history of diverticulosis diagnosed on a colonoscopy in 2011. The patient was apparently diagnosed with deep vein thrombosis in 2005 and was treated with Coumadin. He had a relapse of deep vein thrombosis in 2007, and at that time he was started on Coumadin again and was told to take the Coumadin for the rest of his life. The patient was admitted yesterday after he went to the bathroom early morning and had 4 or 5 loose bowel movements which were bloody. Came to the emergency room. He was not tachycardic or hypertensive. INR was 2.1, and hemoglobin was somewhat low at 11.5. The patient was evaluated yesterday as well as today. He has had no further bleeding since admission. His hemoglobin dropped down from 11.5 to 9.6 and is now 9.9. Denies abdominal pain, nausea, vomiting, hematemesis, or any other significant symptoms.   PAST MEDICAL HISTORY:  1. GERD. 2. Degenerative disk disease. 3. Skin cancer. 4. History of deep vein thrombosis. 5. Benign prostatic hypertrophy. 6. Right heel surgery.  OUTPATIENT MEDICATIONS:   1. Avodart. 2. Flomax. 3. Imipramine.  4. Multivitamin.  5. Coumadin 6 mg a day.   FAMILY HISTORY: Unremarkable.   ALLERGIES: Codeine.   SOCIAL HISTORY: Does not smoke or drink.   REVIEW OF SYSTEMS: Unremarkable except for what is mentioned in the History of Present Illness.   PHYSICAL EXAMINATION:  GENERAL: Well built male, does not appear to be in any acute distress. He has been hemodynamically stable since admission without tachycardia or hypertension.   VITAL SIGNS: Temperature 97.8.   NECK: Neck veins flat.   LUNGS: Clear to auscultation bilaterally with fair  air entry.   CARDIOVASCULAR: Regular rate and rhythm.   ABDOMEN: Quite soft and benign, nontender, nondistended. No rebound or guarding was noted.   NEUROLOGIC: Examination appears to be unremarkable.   LABORATORIES: INR was 2.1 on admission, is 1.2 today. Hemoglobin was 11.5 on admission, dropped down to 9.6 and is 9.9 today. The rest of the CBC is unremarkable. Electrolytes are fine. BUN is slightly elevated at 28, creatinine is normal. Nuclear medicine bleeding scan was done and was unremarkable as well.   ASSESSMENT AND PLAN: Patient with probable diverticular bleed on Coumadin. He has no further signs of gastrointestinal bleed since admission and his hemoglobin and hematocrit remain stable. His INR is now down to 1.2 after withholding his Coumadin. I would recommend continuing to hold the Coumadin for at least 10 to 14 days, longer if clinically acceptable. We will advance from clear liquid to full liquid diet. If no signs of bleeding in the next 20 to 24 hours and hemoglobin and hematocrit remain stable, then patient can probably be discharged home. If the patient remains in the hospital, he will be seen by Dr. Vira Agar over the weekend.  ____________________________ Jill Side, MD si:vtd D: 10/14/2012 17:26:06 ET T: 10/15/2012 09:09:53 ET  JOB#: [id number] cc: Jill Side, MD, <Dictator> Jill Side MD ELECTRONICALLY SIGNED 10/19/2012 11:25

## 2016-01-08 ENCOUNTER — Other Ambulatory Visit: Payer: Self-pay | Admitting: Internal Medicine

## 2016-01-08 DIAGNOSIS — I824Y2 Acute embolism and thrombosis of unspecified deep veins of left proximal lower extremity: Secondary | ICD-10-CM

## 2016-01-09 ENCOUNTER — Ambulatory Visit
Admission: RE | Admit: 2016-01-09 | Discharge: 2016-01-09 | Disposition: A | Discharge status: Home / Self Care | Payer: PPO | Source: Ambulatory Visit | Attending: Internal Medicine | Admitting: Internal Medicine

## 2016-01-09 DIAGNOSIS — M7989 Other specified soft tissue disorders: Secondary | ICD-10-CM | POA: Diagnosis present

## 2016-01-09 DIAGNOSIS — I824Y2 Acute embolism and thrombosis of unspecified deep veins of left proximal lower extremity: Secondary | ICD-10-CM

## 2016-11-16 DIAGNOSIS — C61 Malignant neoplasm of prostate: Secondary | ICD-10-CM

## 2016-11-16 HISTORY — DX: Malignant neoplasm of prostate: C61

## 2016-11-24 DIAGNOSIS — C61 Malignant neoplasm of prostate: Secondary | ICD-10-CM | POA: Insufficient documentation

## 2016-11-26 DIAGNOSIS — Z8546 Personal history of malignant neoplasm of prostate: Secondary | ICD-10-CM | POA: Insufficient documentation

## 2017-09-30 DIAGNOSIS — M87051 Idiopathic aseptic necrosis of right femur: Secondary | ICD-10-CM | POA: Insufficient documentation

## 2019-06-30 ENCOUNTER — Observation Stay: Payer: Medicare HMO

## 2019-06-30 ENCOUNTER — Encounter: Payer: Self-pay | Admitting: Emergency Medicine

## 2019-06-30 ENCOUNTER — Other Ambulatory Visit: Payer: Self-pay

## 2019-06-30 ENCOUNTER — Observation Stay
Admission: EM | Admit: 2019-06-30 | Discharge: 2019-07-02 | Disposition: A | Discharge status: Home / Self Care | Payer: Medicare HMO | Attending: Internal Medicine | Admitting: Internal Medicine

## 2019-06-30 DIAGNOSIS — D120 Benign neoplasm of cecum: Secondary | ICD-10-CM | POA: Insufficient documentation

## 2019-06-30 DIAGNOSIS — I10 Essential (primary) hypertension: Secondary | ICD-10-CM | POA: Diagnosis not present

## 2019-06-30 DIAGNOSIS — K5730 Diverticulosis of large intestine without perforation or abscess without bleeding: Secondary | ICD-10-CM | POA: Insufficient documentation

## 2019-06-30 DIAGNOSIS — K627 Radiation proctitis: Secondary | ICD-10-CM | POA: Insufficient documentation

## 2019-06-30 DIAGNOSIS — Z20828 Contact with and (suspected) exposure to other viral communicable diseases: Secondary | ICD-10-CM | POA: Insufficient documentation

## 2019-06-30 DIAGNOSIS — K2950 Unspecified chronic gastritis without bleeding: Secondary | ICD-10-CM | POA: Insufficient documentation

## 2019-06-30 DIAGNOSIS — R140 Abdominal distension (gaseous): Secondary | ICD-10-CM | POA: Insufficient documentation

## 2019-06-30 DIAGNOSIS — R1013 Epigastric pain: Secondary | ICD-10-CM | POA: Diagnosis not present

## 2019-06-30 DIAGNOSIS — Z8546 Personal history of malignant neoplasm of prostate: Secondary | ICD-10-CM | POA: Insufficient documentation

## 2019-06-30 DIAGNOSIS — Z86718 Personal history of other venous thrombosis and embolism: Secondary | ICD-10-CM | POA: Insufficient documentation

## 2019-06-30 DIAGNOSIS — Z923 Personal history of irradiation: Secondary | ICD-10-CM | POA: Insufficient documentation

## 2019-06-30 DIAGNOSIS — K625 Hemorrhage of anus and rectum: Secondary | ICD-10-CM | POA: Diagnosis present

## 2019-06-30 DIAGNOSIS — Z7901 Long term (current) use of anticoagulants: Secondary | ICD-10-CM | POA: Diagnosis not present

## 2019-06-30 HISTORY — DX: Essential (primary) hypertension: I10

## 2019-06-30 HISTORY — DX: Acute embolism and thrombosis of unspecified deep veins of unspecified lower extremity: I82.409

## 2019-06-30 LAB — SARS CORONAVIRUS 2 BY RT PCR (HOSPITAL ORDER, PERFORMED IN ~~LOC~~ HOSPITAL LAB): SARS Coronavirus 2: NEGATIVE

## 2019-06-30 LAB — COMPREHENSIVE METABOLIC PANEL
ALT: 14 U/L (ref 0–44)
AST: 20 U/L (ref 15–41)
Albumin: 4.6 g/dL (ref 3.5–5.0)
Alkaline Phosphatase: 53 U/L (ref 38–126)
Anion gap: 9 (ref 5–15)
BUN: 19 mg/dL (ref 8–23)
CO2: 27 mmol/L (ref 22–32)
Calcium: 9.2 mg/dL (ref 8.9–10.3)
Chloride: 102 mmol/L (ref 98–111)
Creatinine, Ser: 1.05 mg/dL (ref 0.61–1.24)
GFR calc Af Amer: >60 mL/min (ref >60)
GFR calc non Af Amer: >60 mL/min (ref >60)
Glucose, Bld: 103 mg/dL — ABNORMAL HIGH (ref 70–99)
Potassium: 4.1 mmol/L (ref 3.5–5.1)
Sodium: 138 mmol/L (ref 135–145)
Total Bilirubin: 1 mg/dL (ref 0.3–1.2)
Total Protein: 7 g/dL (ref 6.5–8.1)

## 2019-06-30 LAB — TYPE AND SCREEN
ABO/RH(D): O POS
Antibody Screen: NEGATIVE

## 2019-06-30 LAB — CBC
HCT: 37.8 % — ABNORMAL LOW (ref 39.0–52.0)
Hemoglobin: 12.2 g/dL — ABNORMAL LOW (ref 13.0–17.0)
MCH: 29.3 pg (ref 26.0–34.0)
MCHC: 32.3 g/dL (ref 30.0–36.0)
MCV: 90.6 fL (ref 80.0–100.0)
Platelets: 241 10*3/uL (ref 150–400)
RBC: 4.17 MIL/uL — ABNORMAL LOW (ref 4.22–5.81)
RDW: 13.9 % (ref 11.5–15.5)
WBC: 6.1 10*3/uL (ref 4.0–10.5)
nRBC: 0 % (ref 0.0–0.2)

## 2019-06-30 LAB — IRON AND TIBC
Iron: 81 ug/dL (ref 45–182)
Saturation Ratios: 23 % (ref 17.9–39.5)
TIBC: 356 ug/dL (ref 250–450)
UIBC: 275 ug/dL

## 2019-06-30 LAB — PROTIME-INR
INR: 1.8 — ABNORMAL HIGH (ref 0.8–1.2)
Prothrombin Time: 20.2 seconds — ABNORMAL HIGH (ref 11.4–15.2)

## 2019-06-30 LAB — FERRITIN: Ferritin: 30 ng/mL (ref 24–336)

## 2019-06-30 LAB — FOLATE: Folate: 15.5 ng/mL (ref >5.9)

## 2019-06-30 MED ORDER — PANTOPRAZOLE SODIUM 40 MG IV SOLR
40.0000 mg | Freq: Once | INTRAVENOUS | Status: AC
  Administered 2019-06-30: 40 mg via INTRAVENOUS
  Filled 2019-06-30: qty 40

## 2019-06-30 MED ORDER — TAMSULOSIN HCL 0.4 MG PO CAPS
0.4000 mg | ORAL_CAPSULE | Freq: Every day | ORAL | Status: DC
  Administered 2019-07-01: 0.4 mg via ORAL
  Filled 2019-06-30: qty 1

## 2019-06-30 MED ORDER — TRIAMTERENE-HCTZ 37.5-25 MG PO TABS
1.0000 | ORAL_TABLET | Freq: Every day | ORAL | Status: DC
  Administered 2019-07-01: 1 via ORAL
  Filled 2019-06-30 (×2): qty 1

## 2019-06-30 MED ORDER — ACETAMINOPHEN 650 MG RE SUPP: 650.0000 mg | Freq: Four times a day (QID) | RECTAL | Status: DC | PRN

## 2019-06-30 MED ORDER — POLYETHYLENE GLYCOL 3350 17 G PO PACK: 17.0000 g | PACK | Freq: Every day | ORAL | Status: DC | PRN

## 2019-06-30 MED ORDER — ONDANSETRON HCL 4 MG PO TABS: 4.0000 mg | ORAL_TABLET | Freq: Four times a day (QID) | ORAL | Status: DC | PRN

## 2019-06-30 MED ORDER — IOHEXOL 240 MG/ML SOLN
50.0000 mL | Freq: Once | INTRAMUSCULAR | Status: AC
  Administered 2019-06-30: 50 mL via ORAL

## 2019-06-30 MED ORDER — ONDANSETRON HCL 4 MG/2ML IJ SOLN: 4.0000 mg | Freq: Four times a day (QID) | INTRAMUSCULAR | Status: DC | PRN

## 2019-06-30 MED ORDER — ACETAMINOPHEN 325 MG PO TABS: 650.0000 mg | ORAL_TABLET | Freq: Four times a day (QID) | ORAL | Status: DC | PRN

## 2019-06-30 MED ORDER — IOHEXOL 300 MG/ML  SOLN
100.0000 mL | Freq: Once | INTRAMUSCULAR | Status: AC | PRN
  Administered 2019-06-30: 100 mL via INTRAVENOUS

## 2019-06-30 NOTE — ED Triage Notes (Signed)
Pt to ED via POV c/o rectal bleeding x 2 days. Pt states that he does take Coumadin but stopped it yesterday when the bleeding started per his MD. Pt states that he has been on a liquid diet since yesterday. Pt states that he feels bloated and like he has a stomach ache. Pt states that at times he gets the urge to have a BM but is unable to. Pt is passing dark red blood. Pt denies N/V. Pt is in NAD.

## 2019-06-30 NOTE — ED Provider Notes (Signed)
[facility] Emergency Department Provider Note  ____________________________________________   First MD Initiated Contact with Patient 06/30/19 1952     (approximate)  I have reviewed the triage vital signs and the nursing notes.  History  Chief Complaint Rectal Bleeding    HPI [name] [name] is a 73 y.o. male with a history of DVT on warfarin, who presents the emergency department for GI bleeding.  Symptoms started yesterday, since then he has had at least 5 episodes of large-volume bloody stools, including clots.  He reports some associated fatigue.  No lightheadedness or syncope.  He did see his primary care doctor yesterday morning, and has been holding his warfarin, last dose was Wednesday night.  Hemoglobin on 8/13 was 13, repeat this morning was 11.9.  Patient does note he had a colonoscopy approximately 10 years ago, does not think it was significantly abnormal.  He denies any heavy NSAID use, alcohol use.         Past Medical Hx Past Medical History:  Diagnosis Date  . DVT (deep venous thrombosis) ([place])   . Hypertension     Problem List There are no active problems to display for this patient.   Past Surgical Hx Past Surgical History:  Procedure Laterality Date  . HERNIA REPAIR      Medications Prior to Admission medications   Not on File    Allergies Codeine and Sulfa antibiotics  Family Hx No family history on file.  Social Hx Social History   Tobacco Use  . Smoking status: Never Smoker  . Smokeless tobacco: Never Used  Substance Use Topics  . Alcohol use: Not Currently  . Drug use: Not Currently     Review of Systems  Constitutional: Negative for fever. Negative for chills. Eyes: Negative for visual changes. ENT: Negative for sore throat. Cardiovascular: Negative for chest pain. Respiratory: Negative for shortness of breath. Gastrointestinal: + Bloody stools Genitourinary: Negative for dysuria.  Musculoskeletal: Negative for leg swelling. Skin: Negative for rash. Neurological: Negative for for headaches.   Physical Exam  Vital Signs: ED Triage Vitals  Enc Vitals Group     BP 06/30/19 1654 (!) 158/83     Pulse Rate 06/30/19 1654 90     Resp 06/30/19 1654 16     Temp 06/30/19 1654 98.8 F (37.1 C)     Temp Source 06/30/19 1654 Oral     SpO2 06/30/19 1654 100 %     Weight 06/30/19 1700 204 lb (92.5 kg)     Height 06/30/19 1700 5\' 10"  (1.778 m)     Head Circumference --      Peak Flow --      Pain Score 06/30/19 1655 2     Pain Loc --      Pain Edu? --      Excl. in [place]? --     Constitutional: Alert and oriented.  Eyes: Conjunctivae clear. Sclera anicteric. Head: Normocephalic. Atraumatic. Nose: No congestion. No rhinorrhea. Mouth/Throat: Mucous membranes are moist.  Neck: No stridor.   Cardiovascular: Normal rate, regular rhythm. No murmurs. Extremities well perfused. Respiratory: Normal respiratory effort.  Lungs CTAB. Gastrointestinal: Soft and non-tender. No distention.  Musculoskeletal: No lower extremity edema. Neurologic:  Normal speech and language. No gross focal neurologic deficits are appreciated.  Skin: Skin is warm, [place] and intact. No rash noted. Psychiatric: Mood and affect are appropriate for situation.  EKG  N/A   Radiology  N/A   Procedures  Procedure(s) performed (including critical  care):  Procedures   Initial Impression / Assessment and Plan / ED Course  73 y.o. male with history of DVT, on warfarin who presents to the ED for bloody stools x2 days.  On exam, he is hemodynamically stable.  Abdomen soft and nontender.  Labs reveal hemoglobin 12.2, INR 1.8.   Given he is continuing to have moderate volume, bloody stools, will admit for observation admission, hemoglobin trending.  Will give dose of PPI here. He has been holding his warfarin x 2 doses. As he is hemodynamically stable, not requiring transfusion, will hold on  reversal at this time.   Final Clinical Impression(s) / ED Diagnosis  Final diagnoses:  Rectal bleeding     Note:  This document was prepared using Dragon voice recognition software and may include unintentional dictation errors.   Lilia Pro., MD 06/30/19 2213

## 2019-06-30 NOTE — ED Notes (Signed)
ED TO INPATIENT HANDOFF REPORT  ED Nurse Name and Phone #: Annie Main [phone]  S Name/Age/Gender [name] [name] 73 y.o. male Room/Bed: ED10A/ED10A  Code Status   Code Status: Full Code  Home/SNF/Other Home Patient oriented to: self, place, time and situation Is this baseline? Yes   Triage Complete: Triage complete  Chief Complaint bloody stool  Triage Note Pt to ED via POV c/o rectal bleeding x 2 days. Pt states that he does take Coumadin but stopped it yesterday when the bleeding started per his MD. Pt states that he has been on a liquid diet since yesterday. Pt states that he feels bloated and like he has a stomach ache. Pt states that at times he gets the urge to have a BM but is unable to. Pt is passing dark red blood. Pt denies N/V. Pt is in NAD.    Allergies Allergies  Allergen Reactions  . Codeine Nausea Only  . Sulfa Antibiotics Nausea And Vomiting    Level of Care/Admitting Diagnosis ED Disposition    ED Disposition Condition [facility] Area: [place] [[id number]]  Level of Care: Med-Surg [16]  Covid Evaluation: Asymptomatic Screening Protocol (No Symptoms)  Diagnosis: Rectal bleeding [809983]  Admitting Physician: Hillary Bow [[id number]]  Attending Physician: Hillary Bow [[id number]]  PT Class (Do Not Modify): Observation [104]  PT Acc Code (Do Not Modify): Observation [10022]       B Medical/Surgery History Past Medical History:  Diagnosis Date  . DVT (deep venous thrombosis) ([place])   . Hypertension    Past Surgical History:  Procedure Laterality Date  . HERNIA REPAIR       A IV Location/Drains/Wounds Patient Lines/Drains/Airways Status   Active Line/Drains/Airways    Name:   Placement date:   Placement time:   Site:   Days:   Peripheral IV 06/30/19 Left Antecubital   06/30/19    2018    Antecubital   less than 1          Intake/Output Last 24 hours No intake or output data in the 24 hours ending 06/30/19  2149  Labs/Imaging Results for orders placed or performed during the hospital encounter of 06/30/19 (from the past 48 hour(s))  Comprehensive metabolic panel     Status: Abnormal   Collection Time: 06/30/19  5:20 PM  Result Value Ref Range   Sodium 138 135 - 145 mmol/L   Potassium 4.1 3.5 - 5.1 mmol/L   Chloride 102 98 - 111 mmol/L   CO2 27 22 - 32 mmol/L   Glucose, Bld 103 (H) 70 - 99 mg/dL   BUN 19 8 - 23 mg/dL   Creatinine, Ser 1.05 0.61 - 1.24 mg/dL   Calcium 9.2 8.9 - 10.3 mg/dL   Total Protein 7.0 6.5 - 8.1 g/dL   Albumin 4.6 3.5 - 5.0 g/dL   AST 20 15 - 41 U/L   ALT 14 0 - 44 U/L   Alkaline Phosphatase 53 38 - 126 U/L   Total Bilirubin 1.0 0.3 - 1.2 mg/dL   GFR calc non Af Amer >60 >60 mL/min   GFR calc Af Amer >60 >60 mL/min   Anion gap 9 5 - 15    Comment: Performed at [facility], [address] [place], [place] [address]  CBC     Status: Abnormal   Collection Time: 06/30/19  5:20 PM  Result Value Ref Range   WBC 6.1 4.0 - 10.5 K/uL   RBC  4.17 (L) 4.22 - 5.81 MIL/uL   Hemoglobin 12.2 (L) 13.0 - 17.0 g/dL   HCT 37.8 (L) 39.0 - 52.0 %   MCV 90.6 80.0 - 100.0 fL   MCH 29.3 26.0 - 34.0 pg   MCHC 32.3 30.0 - 36.0 g/dL   RDW 13.9 11.5 - 15.5 %   Platelets 241 150 - 400 K/uL   nRBC 0.0 0.0 - 0.2 %    Comment: Performed at [facility], [address], [place], [place] [address]  [place] - (order if Patient is taking Coumadin / Warfarin)     Status: Abnormal   Collection Time: 06/30/19  5:20 PM  Result Value Ref Range   Prothrombin Time 20.2 (H) 11.4 - 15.2 seconds   INR 1.8 (H) 0.8 - 1.2    Comment: (NOTE) INR goal varies based on device and disease states. Performed at [facility], [address] [place], [place] [address]   Type and screen [place]     Status: None   Collection Time: 06/30/19  5:21 PM  Result Value Ref Range   ABO/RH(D) O POS    Antibody Screen NEG    Sample Expiration       07/03/2019,2359 Performed at [facility], [address] [place], [place] [address]   SARS Coronavirus 2 [facility] order, Performed in [facility] hospital lab) Nasopharyngeal Nasopharyngeal Swab     Status: None   Collection Time: 06/30/19  8:47 PM   Specimen: Nasopharyngeal Swab  Result Value Ref Range   SARS Coronavirus 2 NEGATIVE NEGATIVE    Comment: (NOTE) If result is NEGATIVE SARS-CoV-2 target nucleic acids are NOT DETECTED. The SARS-CoV-2 RNA is generally detectable in upper and lower  respiratory specimens during the acute phase of infection. The lowest  concentration of SARS-CoV-2 viral copies this assay can detect is 250  copies / mL. A negative result does not preclude SARS-CoV-2 infection  and should not be used as the sole basis for treatment or other  patient management decisions.  A negative result may occur with  improper specimen collection / handling, submission of specimen other  than nasopharyngeal swab, presence of viral mutation(s) within the  areas targeted by this assay, and inadequate number of viral copies  (<250 copies / mL). A negative result must be combined with clinical  observations, patient history, and epidemiological information. If result is POSITIVE SARS-CoV-2 target nucleic acids are DETECTED. The SARS-CoV-2 RNA is generally detectable in upper and lower  respiratory specimens dur ing the acute phase of infection.  Positive  results are indicative of active infection with SARS-CoV-2.  Clinical  correlation with patient history and other diagnostic information is  necessary to determine patient infection status.  Positive results do  not rule out bacterial infection or co-infection with other viruses. If result is PRESUMPTIVE POSTIVE SARS-CoV-2 nucleic acids MAY BE PRESENT.   A presumptive positive result was obtained on the submitted specimen  and confirmed on repeat testing.  While 2019 novel coronavirus  (SARS-CoV-2) nucleic  acids may be present in the submitted sample  additional confirmatory testing may be necessary for epidemiological  and / or clinical management purposes  to differentiate between  SARS-CoV-2 and other Sarbecovirus currently known to infect humans.  If clinically indicated additional testing with an alternate test  methodology [phone]) is advised. The SARS-CoV-2 RNA is generally  detectable in upper and lower respiratory sp ecimens during the acute  phase of infection. The expected result  is Negative. Fact Sheet for Patients:  StrictlyIdeas.no Fact Sheet for Healthcare Providers: BankingDealers.co.za This test is not yet approved or cleared by the [place] FDA and has been authorized for detection and/or diagnosis of SARS-CoV-2 by FDA under an Emergency Use Authorization (EUA).  This EUA will remain in effect (meaning this test can be used) for the duration of the COVID-19 declaration under Section 564(b)(1) of the Act, 21 U.S.C. section 360bbb-3(b)(1), unless the authorization is terminated or revoked sooner. Performed at [facility], [address] [place], [place] [address]    No results found.  Pending Labs Unresulted Labs (From admission, onward)    Start     Ordered   07/01/19 8127  Basic metabolic panel  Tomorrow morning,   STAT     06/30/19 2058   07/01/19 0500  CBC  Tomorrow morning,   STAT     06/30/19 2058          Vitals/Pain Today's Vitals   06/30/19 2000 06/30/19 2030 06/30/19 2115 06/30/19 2130  BP: (!) 158/82 (!) 147/65  140/76  Pulse: 88 82 84 79  Resp: 14 14 16 16   Temp:      TempSrc:      SpO2: 100% 98% 98% 99%  Weight:      Height:      PainSc:        Isolation Precautions No active isolations  Medications Medications  acetaminophen (TYLENOL) tablet 650 mg (has no administration in time range)    Or  acetaminophen (TYLENOL) suppository 650 mg (has no administration in time  range)  polyethylene glycol (MIRALAX / GLYCOLAX) packet 17 g (has no administration in time range)  ondansetron (ZOFRAN) tablet 4 mg (has no administration in time range)    Or  ondansetron (ZOFRAN) injection 4 mg (has no administration in time range)  pantoprazole (PROTONIX) injection 40 mg (40 mg Intravenous Given 06/30/19 2024)  iohexol (OMNIPAQUE) 240 MG/ML injection 50 mL (50 mLs Oral Contrast Given 06/30/19 2118)    Mobility walks Low fall risk     R Recommendations: See Admitting Provider Note  Report given to:   Additional Notes:

## 2019-06-30 NOTE — ED Notes (Signed)
Admitting MD at bedside.

## 2019-06-30 NOTE — ED Notes (Signed)
Patient transported to CT 

## 2019-06-30 NOTE — H&P (Signed)
[place] [facility] [place] NAME: [name] [name]    MR#:  [id number]  DATE OF BIRTH:  11/29/1945  DATE OF ADMISSION:  06/30/2019  PRIMARY CARE PHYSICIAN: Adin Hector, MD   REQUESTING/REFERRING PHYSICIAN: Dr. Royden Purl  CHIEF COMPLAINT:   Chief Complaint  Patient presents with  . Rectal Bleeding    HISTORY OF PRESENT ILLNESS:  [name] [name]  is a 73 y.o. male with a known history of remote DVT on Coumadin, hypertension, diverticular bleed in 2013 that resolved on its own presents to the emergency room complaining of 3 days of bright red blood per rectum.  Patient skipped his Coumadin dose yesterday and INR is 1.8.  Hemoglobin is close to normal at 12.2.  Baseline seems to be 12-13. Patient complains of some vague abdominal pain.  No fever.  No recent antibiotic use. Had similar bleeding in 2013 when he was admitted to the hospital and was given vitamin K.  Bleeding scan was done and negative.  Patient's bleeding resolved spontaneously and was discharged home.  Did not have any problem since then. Does not take any NSAIDs.  PAST MEDICAL HISTORY:   Past Medical History:  Diagnosis Date  . DVT (deep venous thrombosis) ([place])   . Hypertension     PAST SURGICAL HISTORY:   Past Surgical History:  Procedure Laterality Date  . HERNIA REPAIR      SOCIAL HISTORY:   Social History   Tobacco Use  . Smoking status: Never Smoker  . Smokeless tobacco: Never Used  Substance Use Topics  . Alcohol use: Not Currently    FAMILY HISTORY:  No history of colon cancer DRUG ALLERGIES:   Allergies  Allergen Reactions  . Codeine Nausea Only  . Sulfa Antibiotics Nausea And Vomiting    REVIEW OF SYSTEMS:   Review of Systems  Constitutional: Positive for malaise/fatigue. Negative for chills and fever.  HENT: Negative for sore throat.   Eyes: Negative for blurred vision, double vision and pain.  Respiratory: Negative for cough, hemoptysis, shortness of breath  and wheezing.   Cardiovascular: Negative for chest pain, palpitations, orthopnea and leg swelling.  Gastrointestinal: Positive for blood in stool and nausea. Negative for abdominal pain, constipation, diarrhea, heartburn and vomiting.  Genitourinary: Negative for dysuria and hematuria.  Musculoskeletal: Negative for back pain and joint pain.  Skin: Negative for rash.  Neurological: Negative for sensory change, speech change, focal weakness and headaches.  Endo/Heme/Allergies: Does not bruise/bleed easily.  Psychiatric/Behavioral: Negative for depression. The patient is not nervous/anxious.     MEDICATIONS AT HOME:   Prior to Admission medications   Medication Sig Start Date End Date Taking? Authorizing Provider  Cyanocobalamin 2500 MCG SUBL Take 2,500 mcg by mouth daily.   Yes [provider]  tamsulosin (FLOMAX) 0.4 MG CAPS capsule Take 0.4 mg by mouth daily. 06/18/19  Yes [provider]  triamterene-hydrochlorothiazide (MAXZIDE-25) 37.5-25 MG tablet Take 1 tablet by mouth daily. 06/02/17  Yes [provider]  warfarin (COUMADIN) 4 MG tablet Take 4 mg by mouth daily. 06/19/19 06/18/20 Yes [provider]     VITAL SIGNS:  Blood pressure (!) 158/82, pulse 88, temperature 98.8 F (37.1 C), temperature source Oral, resp. rate 14, height 5\' 10"  (1.778 m), weight 92.5 kg, SpO2 100 %.  PHYSICAL EXAMINATION:  Physical Exam  GENERAL:  73 y.o.-year-old patient lying in the bed with no acute distress.  EYES: Pupils equal, round, reactive to light and accommodation. No  scleral icterus. Extraocular muscles intact.  HEENT: Head atraumatic, normocephalic. Oropharynx and nasopharynx clear. No oropharyngeal erythema, moist oral mucosa  NECK:  Supple, no jugular venous distention. No thyroid enlargement, no tenderness.  LUNGS: Normal breath sounds bilaterally, no wheezing, rales, rhonchi. No use of accessory muscles of respiration.  CARDIOVASCULAR: S1, S2 normal. No  murmurs, rubs, or gallops.  ABDOMEN: Soft, nontender, nondistended. Bowel sounds present. No organomegaly or mass.  EXTREMITIES: No pedal edema, cyanosis, or clubbing. + 2 pedal & radial pulses b/l.   NEUROLOGIC: Cranial nerves II through XII are intact. No focal Motor or sensory deficits appreciated b/l PSYCHIATRIC: The patient is alert and oriented x 3. Good affect.  SKIN: No obvious rash, lesion, or ulcer.   LABORATORY PANEL:   CBC Recent Labs  Lab 06/30/19 1720  WBC 6.1  HGB 12.2*  HCT 37.8*  PLT 241   ------------------------------------------------------------------------------------------------------------------  Chemistries  Recent Labs  Lab 06/30/19 1720  NA 138  K 4.1  CL 102  CO2 27  GLUCOSE 103*  BUN 19  CREATININE 1.05  CALCIUM 9.2  AST 20  ALT 14  ALKPHOS 53  BILITOT 1.0   ------------------------------------------------------------------------------------------------------------------  Cardiac Enzymes No results for input(s): TROPONINI in the last 168 hours. ------------------------------------------------------------------------------------------------------------------  RADIOLOGY:  No results found.   IMPRESSION AND PLAN:   *Bright red blood per rectum.  Likely diverticular bleed.  Has been going on for 3 days now and hemoglobin is close to normal.  I do not think patient needs a bleeding scan.  But will order if there is ongoing significant bleeding.  I have seen pictures on his phone of his toilet bowl and had dark blood.  Hold Coumadin.  Recheck INR in the morning. Serial hemoglobin check Due to his abdominal pain will get a CT scan of the abdomen and pelvis.  *DVT on Coumadin.  Coumadin held due to GI bleed.  Monitor.  *Hypertension.  Continue home medications   All the records are reviewed and case discussed with ED provider. Management plans discussed with the patient, family and they are in agreement.  CODE STATUS: FULL  CODE  TOTAL TIME TAKING CARE OF THIS PATIENT: 40 minutes.   Neita Carp M.D on 06/30/2019 at 9:03 PM  Between 7am to 6pm - Pager - [phone]  After 6pm go to [email or web address] - password [email or web address] [facility]  [facility]  Office  [phone]  CC: Primary care physician; Adin Hector, MD  Note: This dictation was prepared with Dragon dictation along with smaller phrase technology. Any transcriptional errors that result from this process are unintentional.

## 2019-06-30 NOTE — ED Notes (Signed)
ED Provider at bedside. 

## 2019-06-30 NOTE — ED Notes (Signed)
CT at bedside 

## 2019-07-01 DIAGNOSIS — K921 Melena: Secondary | ICD-10-CM | POA: Diagnosis not present

## 2019-07-01 LAB — CBC
HCT: 31.6 % — ABNORMAL LOW (ref 39.0–52.0)
Hemoglobin: 10.5 g/dL — ABNORMAL LOW (ref 13.0–17.0)
MCH: 29.8 pg (ref 26.0–34.0)
MCHC: 33.2 g/dL (ref 30.0–36.0)
MCV: 89.8 fL (ref 80.0–100.0)
Platelets: 200 10*3/uL (ref 150–400)
RBC: 3.52 MIL/uL — ABNORMAL LOW (ref 4.22–5.81)
RDW: 13.8 % (ref 11.5–15.5)
WBC: 4.9 10*3/uL (ref 4.0–10.5)
nRBC: 0 % (ref 0.0–0.2)

## 2019-07-01 LAB — BASIC METABOLIC PANEL
Anion gap: 7 (ref 5–15)
BUN: 17 mg/dL (ref 8–23)
CO2: 26 mmol/L (ref 22–32)
Calcium: 8.5 mg/dL — ABNORMAL LOW (ref 8.9–10.3)
Chloride: 106 mmol/L (ref 98–111)
Creatinine, Ser: 0.93 mg/dL (ref 0.61–1.24)
GFR calc Af Amer: >60 mL/min (ref >60)
GFR calc non Af Amer: >60 mL/min (ref >60)
Glucose, Bld: 95 mg/dL (ref 70–99)
Potassium: 3.4 mmol/L — ABNORMAL LOW (ref 3.5–5.1)
Sodium: 139 mmol/L (ref 135–145)

## 2019-07-01 LAB — HEMOGLOBIN AND HEMATOCRIT, BLOOD
HCT: 34.9 % — ABNORMAL LOW (ref 39.0–52.0)
Hemoglobin: 11.4 g/dL — ABNORMAL LOW (ref 13.0–17.0)

## 2019-07-01 LAB — PROTIME-INR
INR: 1.8 — ABNORMAL HIGH (ref 0.8–1.2)
Prothrombin Time: 20.5 seconds — ABNORMAL HIGH (ref 11.4–15.2)

## 2019-07-01 LAB — VITAMIN B12: Vitamin B-12: 1499 pg/mL — ABNORMAL HIGH (ref 180–914)

## 2019-07-01 MED ORDER — SODIUM CHLORIDE 0.9 % IV SOLN
510.0000 mg | Freq: Once | INTRAVENOUS | Status: AC
  Administered 2019-07-01: 510 mg via INTRAVENOUS
  Filled 2019-07-01: qty 17

## 2019-07-01 MED ORDER — PANTOPRAZOLE SODIUM 40 MG IV SOLR
40.0000 mg | Freq: Two times a day (BID) | INTRAVENOUS | Status: DC
  Administered 2019-07-01 (×2): 40 mg via INTRAVENOUS
  Filled 2019-07-01 (×2): qty 40

## 2019-07-01 MED ORDER — POTASSIUM CHLORIDE CRYS ER 20 MEQ PO TBCR
40.0000 meq | EXTENDED_RELEASE_TABLET | Freq: Once | ORAL | Status: AC
  Administered 2019-07-01: 40 meq via ORAL
  Filled 2019-07-01: qty 2

## 2019-07-01 MED ORDER — MAGNESIUM CITRATE PO SOLN
1.0000 | Freq: Once | ORAL | Status: AC
  Administered 2019-07-01: 1 via ORAL
  Filled 2019-07-01: qty 296

## 2019-07-01 MED ORDER — PEG 3350-KCL-NA BICARB-NACL 420 G PO SOLR
4000.0000 mL | Freq: Once | ORAL | Status: AC
  Administered 2019-07-01: 4000 mL via ORAL
  Filled 2019-07-01: qty 4000

## 2019-07-01 NOTE — Care Management Obs Status (Signed)
MEDICARE OBSERVATION STATUS NOTIFICATION   Patient Details  Name: JARIUS DIEUDONNE MRN: [id number] Date of Birth: 1946/09/07   Medicare Observation Status Notification Given:  Yes    Aleister Lady A Gad Aymond, RN 07/01/2019, 1:18 PM

## 2019-07-01 NOTE — Progress Notes (Signed)
[place] at [place] NAME: [name] [name]    MR#:  [id number]  DATE OF BIRTH:  [birth year]  SUBJECTIVE:  CHIEF COMPLAINT:   Chief Complaint  Patient presents with  . Rectal Bleeding   No new complaints.  No further bleeding.  Coumadin currently on hold.  No fevers. REVIEW OF SYSTEMS:  Review of Systems  Constitutional: Negative for chills and fever.  HENT: Negative for hearing loss and tinnitus.   Eyes: Negative for blurred vision.  Respiratory: Negative for cough and shortness of breath.   Cardiovascular: Negative for chest pain and palpitations.  Gastrointestinal: Negative for abdominal pain, heartburn, nausea and vomiting.  Genitourinary: Negative for dysuria and urgency.  Musculoskeletal: Negative for back pain and myalgias.  Skin: Negative for itching and rash.  Neurological: Negative for dizziness and headaches.  Psychiatric/Behavioral: Negative for depression and hallucinations.    DRUG ALLERGIES:   Allergies  Allergen Reactions  . Codeine Nausea Only  . Sulfa Antibiotics Nausea And Vomiting   VITALS:  Blood pressure 132/61, pulse 71, temperature 98.4 F (36.9 C), temperature source Oral, resp. rate 16, height 5\' 10"  (1.778 m), weight 91.5 kg, SpO2 99 %. PHYSICAL EXAMINATION:  Physical Exam   GENERAL:  73 y.o.-year-old patient lying in the bed with no acute distress.  EYES: Pupils equal, round, reactive to light and accommodation. No scleral icterus. Extraocular muscles intact.  HEENT: Head atraumatic, normocephalic. Oropharynx and nasopharynx clear. No oropharyngeal erythema, moist oral mucosa  NECK:  Supple, no jugular venous distention. No thyroid enlargement, no tenderness.  LUNGS: Normal breath sounds bilaterally, no wheezing, rales, rhonchi. No use of accessory muscles of respiration.  CARDIOVASCULAR: S1, S2 normal. No murmurs, rubs, or gallops.  ABDOMEN: Soft, nontender, nondistended. Bowel sounds present. No  organomegaly or mass.  EXTREMITIES: No pedal edema, cyanosis, or clubbing. + 2 pedal & radial pulses b/l.   NEUROLOGIC: Cranial nerves II through XII are intact. No focal Motor or sensory deficits appreciated b/l PSYCHIATRIC: The patient is alert and oriented x 3. Good affect.  SKIN: No obvious rash, lesion, or ulcer.   LABORATORY PANEL:  Male CBC Recent Labs  Lab 07/01/19 0549  WBC 4.9  HGB 10.5*  HCT 31.6*  PLT 200   ------------------------------------------------------------------------------------------------------------------ Chemistries  Recent Labs  Lab 06/30/19 1720 07/01/19 0549  NA 138 139  K 4.1 3.4*  CL 102 106  CO2 27 26  GLUCOSE 103* 95  BUN 19 17  CREATININE 1.05 0.93  CALCIUM 9.2 8.5*  AST 20  --   ALT 14  --   ALKPHOS 53  --   BILITOT 1.0  --    RADIOLOGY:  Ct Abdomen Pelvis W Contrast  Result Date: 06/30/2019 CLINICAL DATA:  Rectal bleeding for the past 2 days. Stopped Coumadin yesterday. EXAM: CT ABDOMEN AND PELVIS WITH CONTRAST TECHNIQUE: Multidetector CT imaging of the abdomen and pelvis was performed using the standard protocol following bolus administration of intravenous contrast. CONTRAST:  153mL OMNIPAQUE IOHEXOL 300 MG/ML  SOLN COMPARISON:  Report dated 01/24/2009. FINDINGS: Lower chest: Small amount of linear scarring at both lung bases. Tiny right lower lobe calcified granuloma. Hepatobiliary: Multiple small liver cysts. Possible small number of small gallbladder polyps or noncalcified stones. No gallbladder wall thickening or pericholecystic fluid. Pancreas: Unremarkable. No pancreatic ductal dilatation or surrounding inflammatory changes. Spleen: Normal in size without focal abnormality other than a small calcified granuloma inferiorly. Adrenals/Urinary Tract: Normal appearing adrenal glands. 5 mm lower pole  right renal calculus additional tiny lower pole right renal calculus tiny bilateral renal cysts. Unremarkable ureters and urinary bladder.  No hydronephrosis. Stomach/Bowel: Multiple colonic diverticula without evidence of diverticulitis. No evidence of appendicitis. Unremarkable stomach and small bowel. Vascular/Lymphatic: Atheromatous arterial calcifications without aneurysm. No enlarged lymph nodes. Reproductive: Mildly enlarged and mildly heterogeneous prostate gland. Other: Small to moderate-sized right inguinal hernia containing fat. 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 Mild right hip degenerative changes. Lower thoracic spine degenerative changes and mild lumbar spine degenerative changes. IMPRESSION: 1. No acute abnormality. 2. Colonic diverticulosis. 3. Small to moderate-sized right inguinal hernia containing fat. 4. 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 Electronically Signed   By: Claudie Revering M.D.   On: 06/30/2019 22:38   ASSESSMENT AND PLAN:   *Bright red blood per rectum.  Likely diverticular bleed. No further bleeding since admission.  Noted drop in hemoglobin to 10 from yesterday.  Follow-up on repeat hemoglobin later today. Coumadin currently on hold.  INR of 1.8. Patient had CT scan of the abdomen and pelvis done with findings consistent with colonic diverticulosis Patient seen by gastroenterologist.  Plans for EGD and colonoscopy in a.m.  N.p.o. after midnight.  *Hypertension.  Continue home medications   DVT prophylaxis; Coumadin placed on hold due to GI bleed. SCDs   All the records are reviewed and case discussed with Care Management/Social Worker. Management plans discussed with the patient, family and they are in agreement.  CODE STATUS: Full Code  TOTAL TIME TAKING CARE OF THIS PATIENT: 27 minutes.   More than 50% of the time was spent in counseling/coordination of care: YES  POSSIBLE D/C IN 2 DAYS, DEPENDING ON CLINICAL CONDITION.   Tanaysha Alkins M.D on 07/01/2019 at 12:25 PM  Between 7am to 6pm -  Pager - [phone]  After 6pm go to [email or web address] - password [email or web address] [facility]  [facility]  Office  [phone]  CC: Primary care physician; Adin Hector, MD  Note: This dictation was prepared with Dragon dictation along with smaller phrase technology. Any transcriptional errors that result from this process are unintentional.

## 2019-07-01 NOTE — Consult Note (Signed)
Cephas Darby, MD [address]  [place]  [place], [place] [address]  Main: [phone]  Fax: [phone] Pager: [phone]   Consultation  Referring Provider:     No ref. provider found Primary Care Physician:  Adin Hector, MD Primary Gastroenterologist:   None      Reason for Consultation:     Hematochezia  Date of Admission:  06/30/2019 Date of Consultation:  07/01/2019         HPI:   Anthonymichael Munday Montella is a 73 y.o. male with history of recurrent DVT, on long-term anticoagulation with Coumadin presented to ER yesterday with 3 days history of black and maroon bloody bowel movements mixed with stool.  He initially went to see his PCP on 06/29/2019 few weeks history of abdominal discomfort relieved with bowel movement and an episode of black stool and blood mixed with the stool on 06/29/2019.  He had 2 more episodes on the floor.  This morning, he had another bowel movement which was actually brown with scant amount of blood.  His hemoglobin on 8/13 was 11.8.  Baseline hemoglobin 13.2.  On arrival, he was hemodynamically stable, hemoglobin 12.2, platelets 200, BUN/creatinine normal, normal LFTs, INR 1.8.  He underwent CT abdomen and pelvis with IV contrast which revealed colonic diverticulosis, right inguinal hernia, right femoral head avascular necrosis Patient is admitted to the floor and GI is consulted for further evaluation  Patient reports having had a similar episode in 09/2012, underwent nuclear medicine bleeding scan which was negative Patient denies smoking or heavy alcohol use  NSAIDs: None  Antiplts/Anticoagulants/Anti thrombotics: Coumadin for DVT  GI Procedures: Colonoscopy 10 years ago which was reportedly normal  Past Medical History:  Diagnosis Date   DVT (deep venous thrombosis) ([place])    Hypertension     Past Surgical History:  Procedure Laterality Date   HERNIA REPAIR      Prior to Admission medications   Medication Sig Start Date End Date  Taking? Authorizing Provider  Cyanocobalamin 2500 MCG SUBL Take 2,500 mcg by mouth daily.   Yes [provider]  tamsulosin (FLOMAX) 0.4 MG CAPS capsule Take 0.4 mg by mouth daily. 06/18/19  Yes [provider]  triamterene-hydrochlorothiazide (MAXZIDE-25) 37.5-25 MG tablet Take 1 tablet by mouth daily. 06/02/17  Yes [provider]  warfarin (COUMADIN) 4 MG tablet Take 4 mg by mouth daily. 06/19/19 06/18/20 Yes [provider]   Current Facility-Administered Medications:    acetaminophen (TYLENOL) tablet 650 mg, 650 mg, Oral, Q6H PRN **OR** acetaminophen (TYLENOL) suppository 650 mg, 650 mg, Rectal, Q6H PRN, Sudini, Srikar, MD   ferumoxytol (FERAHEME) 510 mg in sodium chloride 0.9 % 100 mL IVPB, 510 mg, Intravenous, Once, Jacub Waiters, Tally Due, MD   magnesium citrate solution 1 Bottle, 1 Bottle, Oral, Once, Neziah Vogelgesang, Tally Due, MD   ondansetron (ZOFRAN) tablet 4 mg, 4 mg, Oral, Q6H PRN **OR** ondansetron (ZOFRAN) injection 4 mg, 4 mg, Intravenous, Q6H PRN, Sudini, Srikar, MD   pantoprazole (PROTONIX) injection 40 mg, 40 mg, Intravenous, BID, Arryanna Holquin, Tally Due, MD   polyethylene glycol (MIRALAX / GLYCOLAX) packet 17 g, 17 g, Oral, Daily PRN, Sudini, Srikar, MD   polyethylene glycol-electrolytes (NuLYTELY/GoLYTELY) solution 4,000 mL, 4,000 mL, Oral, Once, Makara Lanzo, Tally Due, MD   potassium chloride SA (K-DUR) CR tablet 40 mEq, 40 mEq, Oral, Once, Ojie, Jude, MD   tamsulosin (FLOMAX) capsule 0.4 mg, 0.4 mg, Oral, Daily, Sudini, Srikar, MD   triamterene-hydrochlorothiazide (MAXZIDE-25) 37.5-25 MG  per tablet 1 tablet, 1 tablet, Oral, Daily, Sudini, Srikar, MD   No family history on file.   Social History   Tobacco Use   Smoking status: Never Smoker   Smokeless tobacco: Never Used  Substance Use Topics   Alcohol use: Not Currently   Drug use: Not Currently    Allergies as of 06/30/2019 - Review Complete 06/30/2019  Allergen Reaction Noted    Codeine Nausea Only 06/28/2014   Sulfa antibiotics Nausea And Vomiting 04/16/2014    Review of Systems:    All systems reviewed and negative except where noted in HPI.   Physical Exam:  Vital signs in last 24 hours: Temp:  [97.5 F (36.4 C)-98.8 F (37.1 C)] 97.5 F (36.4 C) (08/15 0428) Pulse Rate:  [66-90] 66 (08/15 0428) Resp:  [14-20] 16 (08/15 0428) BP: (127-158)/(59-83) 127/59 (08/15 0428) SpO2:  [98 %-100 %] 99 % (08/15 0428) Weight:  [91.5 kg-92.5 kg] 91.5 kg (08/14 2253) Last BM Date: 07/01/19 General:   Pleasant, cooperative in NAD Head:  Normocephalic and atraumatic. Eyes:   No icterus.   Conjunctiva pink. PERRLA. Ears:  Normal auditory acuity. Neck:  Supple; no masses or thyroidomegaly Lungs: Respirations even and unlabored. Lungs clear to auscultation bilaterally.   No wheezes, crackles, or rhonchi.  Heart:  Regular rate and rhythm;  Without murmur, clicks, rubs or gallops Abdomen:  Soft, moderately distended, tympanic to percussion nontender. Normal bowel sounds. No appreciable masses or hepatomegaly.  No rebound or guarding.  Rectal:  Not performed. Msk:  Symmetrical without gross deformities.  Strength normal Extremities:  Without edema, cyanosis or clubbing. Neurologic:  Alert and oriented x3;  grossly normal neurologically. Skin:  Intact without significant lesions or rashes. Cervical Nodes:  No significant cervical adenopathy. Psych:  Alert and cooperative. Normal affect.  LAB RESULTS: CBC Latest Ref Rng & Units 07/01/2019 06/30/2019 10/15/2012  WBC 4.0 - 10.5 K/uL 4.9 6.1 7.0  Hemoglobin 13.0 - 17.0 g/dL 10.5(L) 12.2(L) 9.1(L)  Hematocrit 39.0 - 52.0 % 31.6(L) 37.8(L) 27.3(L)  Platelets 150 - 400 K/uL 200 241 224    BMET BMP Latest Ref Rng & Units 07/01/2019 06/30/2019 10/15/2012  Glucose 70 - 99 mg/dL 95 103(H) 91  BUN 8 - 23 mg/dL _0 Creatinine 0.61 - 1.24 mg/dL 0.93 1.05 0.97  Sodium 135 - 145 mmol/L 139 138 144  Potassium 3.5 - 5.1 mmol/L  3.4(L) 4.1 4.3  Chloride 98 - 111 mmol/L 106 102 112(H)  CO2 22 - 32 mmol/L _1 Calcium 8.9 - 10.3 mg/dL 8.5(L) 9.2 8.5    LFT Hepatic Function Latest Ref Rng & Units 06/30/2019 10/13/2012  Total Protein 6.5 - 8.1 g/dL 7.0 6.0(L)  Albumin 3.5 - 5.0 g/dL 4.6 3.5  AST 15 - 41 U/L 20 20  ALT 0 - 44 U/L 14 19  Alk Phosphatase 38 - 126 U/L 53 64  Total Bilirubin 0.3 - 1.2 mg/dL 1.0 0.2     STUDIES: Ct Abdomen Pelvis W Contrast  Result Date: 06/30/2019 CLINICAL DATA:  Rectal bleeding for the past 2 days. Stopped Coumadin yesterday. EXAM: CT ABDOMEN AND PELVIS WITH CONTRAST TECHNIQUE: Multidetector CT imaging of the abdomen and pelvis was performed using the standard protocol following bolus administration of intravenous contrast. CONTRAST:  132m OMNIPAQUE IOHEXOL 300 MG/ML  SOLN COMPARISON:  Report dated 01/24/2009. FINDINGS: Lower chest: Small amount of linear scarring at both lung bases. Tiny right lower lobe calcified granuloma. Hepatobiliary: Multiple small liver cysts. Possible  small number of small gallbladder polyps or noncalcified stones. No gallbladder wall thickening or pericholecystic fluid. Pancreas: Unremarkable. No pancreatic ductal dilatation or surrounding inflammatory changes. Spleen: Normal in size without focal abnormality other than a small calcified granuloma inferiorly. Adrenals/Urinary Tract: Normal appearing adrenal glands. 5 mm lower pole right renal calculus additional tiny lower pole right renal calculus tiny bilateral renal cysts. Unremarkable ureters and urinary bladder. No hydronephrosis. Stomach/Bowel: Multiple colonic diverticula without evidence of diverticulitis. No evidence of appendicitis. Unremarkable stomach and small bowel. Vascular/Lymphatic: Atheromatous arterial calcifications without aneurysm. No enlarged lymph nodes. Reproductive: Mildly enlarged and mildly heterogeneous prostate gland. Other: Small to moderate-sized right inguinal hernia containing  fat. 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 Mild right hip degenerative changes. Lower thoracic spine degenerative changes and mild lumbar spine degenerative changes. IMPRESSION: 1. No acute abnormality. 2. Colonic diverticulosis. 3. Small to moderate-sized right inguinal hernia containing fat. 4. 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 Electronically Signed   By: Claudie Revering M.D.   On: 06/30/2019 22:38      Impression / Plan:   Tivon Lemoine Hilger is a 73 y.o. 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 past midnight Recommend EGD and colonoscopy for further evaluation  I have discussed alternative options, risks & benefits,  which include, but are not limited to, bleeding, infection, perforation,respiratory complication & drug reaction.  The patient agrees with this plan & written consent will be obtained.     Thank you for involving me in the care of this patient.      LOS: 0 days   Sherri Sear, MD  07/01/2019, 9:50 AM   Note: This dictation was prepared with Dragon dictation along with smaller phrase technology. Any transcriptional errors that result from this process are unintentional.

## 2019-07-02 ENCOUNTER — Encounter: Payer: Self-pay | Admitting: Anesthesiology

## 2019-07-02 ENCOUNTER — Observation Stay: Payer: Medicare HMO | Admitting: Anesthesiology

## 2019-07-02 ENCOUNTER — Encounter
Admission: EM | Disposition: A | Discharge status: Home / Self Care | Payer: Self-pay | Source: Home / Self Care | Attending: Student

## 2019-07-02 DIAGNOSIS — R1013 Epigastric pain: Secondary | ICD-10-CM

## 2019-07-02 DIAGNOSIS — K627 Radiation proctitis: Secondary | ICD-10-CM

## 2019-07-02 DIAGNOSIS — K625 Hemorrhage of anus and rectum: Secondary | ICD-10-CM

## 2019-07-02 DIAGNOSIS — D120 Benign neoplasm of cecum: Secondary | ICD-10-CM | POA: Diagnosis not present

## 2019-07-02 HISTORY — PX: COLONOSCOPY: SHX5424

## 2019-07-02 HISTORY — PX: ESOPHAGOGASTRODUODENOSCOPY: SHX5428

## 2019-07-02 LAB — CBC
HCT: 34.3 % — ABNORMAL LOW (ref 39.0–52.0)
Hemoglobin: 11.2 g/dL — ABNORMAL LOW (ref 13.0–17.0)
MCH: 29.8 pg (ref 26.0–34.0)
MCHC: 32.7 g/dL (ref 30.0–36.0)
MCV: 91.2 fL (ref 80.0–100.0)
Platelets: 226 10*3/uL (ref 150–400)
RBC: 3.76 MIL/uL — ABNORMAL LOW (ref 4.22–5.81)
RDW: 13.9 % (ref 11.5–15.5)
WBC: 7 10*3/uL (ref 4.0–10.5)
nRBC: 0 % (ref 0.0–0.2)

## 2019-07-02 LAB — BASIC METABOLIC PANEL
Anion gap: 5 (ref 5–15)
BUN: 14 mg/dL (ref 8–23)
CO2: 26 mmol/L (ref 22–32)
Calcium: 9 mg/dL (ref 8.9–10.3)
Chloride: 109 mmol/L (ref 98–111)
Creatinine, Ser: 1.01 mg/dL (ref 0.61–1.24)
GFR calc Af Amer: >60 mL/min (ref >60)
GFR calc non Af Amer: >60 mL/min (ref >60)
Glucose, Bld: 97 mg/dL (ref 70–99)
Potassium: 3.5 mmol/L (ref 3.5–5.1)
Sodium: 140 mmol/L (ref 135–145)

## 2019-07-02 LAB — PROTIME-INR
INR: 1.5 — ABNORMAL HIGH (ref 0.8–1.2)
Prothrombin Time: 18.2 seconds — ABNORMAL HIGH (ref 11.4–15.2)

## 2019-07-02 LAB — MAGNESIUM: Magnesium: 2.2 mg/dL (ref 1.7–2.4)

## 2019-07-02 SURGERY — EGD (ESOPHAGOGASTRODUODENOSCOPY)
Anesthesia: General

## 2019-07-02 MED ORDER — LIDOCAINE HCL (CARDIAC) PF 100 MG/5ML IV SOSY
PREFILLED_SYRINGE | INTRAVENOUS | Status: DC | PRN
  Administered 2019-07-02: 50 mg via INTRAVENOUS

## 2019-07-02 MED ORDER — SODIUM CHLORIDE 0.9 % IV SOLN
INTRAVENOUS | Status: DC
  Administered 2019-07-02: 10:00:00 via INTRAVENOUS

## 2019-07-02 MED ORDER — PANTOPRAZOLE SODIUM 40 MG PO TBEC: 40.0000 mg | DELAYED_RELEASE_TABLET | Freq: Every day | ORAL | 0 refills | Status: DC

## 2019-07-02 MED ORDER — PROPOFOL 10 MG/ML IV BOLUS
INTRAVENOUS | Status: AC
  Filled 2019-07-02: qty 20

## 2019-07-02 MED ORDER — PROPOFOL 10 MG/ML IV BOLUS
INTRAVENOUS | Status: DC | PRN
  Administered 2019-07-02: 50 mg via INTRAVENOUS

## 2019-07-02 MED ORDER — PROPOFOL 500 MG/50ML IV EMUL
INTRAVENOUS | Status: AC
  Filled 2019-07-02: qty 50

## 2019-07-02 MED ORDER — LIDOCAINE HCL URETHRAL/MUCOSAL 2 % EX GEL
CUTANEOUS | Status: AC
  Filled 2019-07-02: qty 5

## 2019-07-02 MED ORDER — LIDOCAINE HCL URETHRAL/MUCOSAL 2 % EX GEL: 1.0000 "application " | Freq: Once | CUTANEOUS | Status: DC

## 2019-07-02 MED ORDER — PROPOFOL 500 MG/50ML IV EMUL
INTRAVENOUS | Status: DC | PRN
  Administered 2019-07-02: 175 ug/kg/min via INTRAVENOUS

## 2019-07-02 MED ORDER — PHENYLEPHRINE HCL (PRESSORS) 10 MG/ML IV SOLN
INTRAVENOUS | Status: DC | PRN
  Administered 2019-07-02: 100 ug via INTRAVENOUS

## 2019-07-02 NOTE — Anesthesia Post-op Follow-up Note (Signed)
Anesthesia QCDR form completed.        

## 2019-07-02 NOTE — Anesthesia Preprocedure Evaluation (Addendum)
Anesthesia Evaluation  Patient identified by MRN, date of birth, ID band Patient awake    Reviewed: Allergy & Precautions, H&P , NPO status , reviewed documented beta blocker date and time   Airway Mallampati: II  TM Distance: >3 FB Neck ROM: full    Dental  (+) Chipped, Teeth Intact   Pulmonary    Pulmonary exam normal        Cardiovascular hypertension, + DVT  Normal cardiovascular exam     Neuro/Psych    GI/Hepatic   Endo/Other    Renal/GU      Musculoskeletal   Abdominal   Peds  Hematology   Anesthesia Other Findings Past Medical History: No date: DVT (deep venous thrombosis) ([facility]) No date: Hypertension  Past Surgical History: No date: HERNIA REPAIR  BMI    Body Mass Index: 28.94 kg/m   INR today 1.5      Reproductive/Obstetrics                            Anesthesia Physical Anesthesia Plan  ASA: II and emergent  Anesthesia Plan: General   Post-op Pain Management:    Induction: Intravenous  PONV Risk Score and Plan: 1 and Treatment may vary due to age or medical condition and TIVA  Airway Management Planned: Nasal Cannula and Natural Airway  Additional Equipment:   Intra-op Plan:   Post-operative Plan:   Informed Consent: I have reviewed the patients History and Physical, chart, labs and discussed the procedure including the risks, benefits and alternatives for the proposed anesthesia with the patient or authorized representative who has indicated his/her understanding and acceptance.     Dental Advisory Given  Plan Discussed with: CRNA  Anesthesia Plan Comments:       Anesthesia Quick Evaluation

## 2019-07-02 NOTE — Progress Notes (Signed)
Discharge order received. Patient mental status is at baseline. Vital signs stable . No signs of acute distress. Discharge instructions given. Patient verbalized understanding. No other issues noted at this time.   

## 2019-07-02 NOTE — Progress Notes (Signed)
[place] [facility] [place] NAME: [name] [name]    MR#:  [id number]  DATE OF BIRTH:  02-Jun-1946  SUBJECTIVE:  CHIEF COMPLAINT:   Chief Complaint  Patient presents with  . Rectal Bleeding   No new complaints.  No further bleeding.  Coumadin currently on hold.  No fevers.  Scheduled for endoscopic evaluation today  REVIEW OF SYSTEMS:  Review of Systems  Constitutional: Negative for chills and fever.  HENT: Negative for hearing loss and tinnitus.   Eyes: Negative for blurred vision.  Respiratory: Negative for cough and shortness of breath.   Cardiovascular: Negative for chest pain and palpitations.  Gastrointestinal: Negative for abdominal pain, heartburn, nausea and vomiting.  Genitourinary: Negative for dysuria and urgency.  Musculoskeletal: Negative for back pain and myalgias.  Skin: Negative for itching and rash.  Neurological: Negative for dizziness and headaches.  Psychiatric/Behavioral: Negative for depression and hallucinations.    DRUG ALLERGIES:   Allergies  Allergen Reactions  . Codeine Nausea Only  . Sulfa Antibiotics Nausea And Vomiting   VITALS:  Blood pressure (!) 145/70, pulse 71, temperature 98.4 F (36.9 C), temperature source Tympanic, resp. rate 16, height 5\' 10"  (1.778 m), weight 91.5 kg, SpO2 100 %. PHYSICAL EXAMINATION:  Physical Exam   GENERAL:  73 y.o.-year-old patient lying in the bed with no acute distress.  EYES: Pupils equal, round, reactive to light and accommodation. No scleral icterus. Extraocular muscles intact.  HEENT: Head atraumatic, normocephalic. Oropharynx and nasopharynx clear. No oropharyngeal erythema, moist oral mucosa  NECK:  Supple, no jugular venous distention. No thyroid enlargement, no tenderness.  LUNGS: Normal breath sounds bilaterally, no wheezing, rales, rhonchi. No use of accessory muscles of respiration.  CARDIOVASCULAR: S1, S2 normal. No murmurs, rubs, or gallops.  ABDOMEN: Soft,  nontender, nondistended. Bowel sounds present. No organomegaly or mass.  EXTREMITIES: No pedal edema, cyanosis, or clubbing. + 2 pedal & radial pulses b/l.   NEUROLOGIC: Cranial nerves II through XII are intact. No focal Motor or sensory deficits appreciated b/l PSYCHIATRIC: The patient is alert and oriented x 3. Good affect.  SKIN: No obvious rash, lesion, or ulcer.   LABORATORY PANEL:  Male CBC Recent Labs  Lab 07/02/19 0554  WBC 7.0  HGB 11.2*  HCT 34.3*  PLT 226   ------------------------------------------------------------------------------------------------------------------ Chemistries  Recent Labs  Lab 06/30/19 1720  07/02/19 0554  NA 138   < > 140  K 4.1   < > 3.5  CL 102   < > 109  CO2 27   < > 26  GLUCOSE 103*   < > 97  BUN 19   < > 14  CREATININE 1.05   < > 1.01  CALCIUM 9.2   < > 9.0  MG  --   --  2.2  AST 20  --   --   ALT 14  --   --   ALKPHOS 53  --   --   BILITOT 1.0  --   --    < > = values in this interval not displayed.   RADIOLOGY:  No results found. ASSESSMENT AND PLAN:   *Bright red blood per rectum.  Likely diverticular bleed. No further bleeding since admission.  Hemoglobin stable at 11.2 today.  Coumadin currently on hold.  INR of 1.5. Patient had CT scan of the abdomen and pelvis done with findings consistent with colonic diverticulosis Patient seen by gastroenterologist.  Plans for EGD and colonoscopy today  *  Hypertension.  Continue home medications  *History of DVT Coumadin currently on hold due to GI bleed.  DVT prophylaxis; Coumadin placed on hold due to GI bleed. SCDs   All the records are reviewed and case discussed with Care Management/Social Worker. Management plans discussed with the patient, family and they are in agreement.  CODE STATUS: Full Code  TOTAL TIME TAKING CARE OF THIS PATIENT: 28 minutes.   More than 50% of the time was spent in counseling/coordination of care: YES  POSSIBLE D/C IN 1-2 DAYS, DEPENDING  ON CLINICAL CONDITION.   Anchor Dwan M.D on 07/02/2019 at 10:28 AM  Between 7am to 6pm - Pager - [phone]  After 6pm go to [email or web address] - password [email or web address] [facility]  [facility] [place] [facility]  Office  [phone]  CC: Primary care physician; Adin Hector, MD  Note: This dictation was prepared with Dragon dictation along with smaller phrase technology. Any transcriptional errors that result from this process are unintentional.

## 2019-07-02 NOTE — Discharge Summary (Signed)
[place] [facility] [place] NAME: [name] [name]    MR#:  [id number]  DATE OF BIRTH:  January 20, 1946  DATE OF ADMISSION:  06/30/2019   ADMITTING PHYSICIAN: Hillary Bow, MD  DATE OF DISCHARGE: 07/02/2019  PRIMARY CARE PHYSICIAN: Adin Hector, MD   ADMISSION DIAGNOSIS:  Rectal bleeding [K62.5] DISCHARGE DIAGNOSIS:  Principal Problem:   Rectal bleeding  SECONDARY DIAGNOSIS:   Past Medical History:  Diagnosis Date  . DVT (deep venous thrombosis) ([place])   . Hypertension   . Prostate cancer ([place]) 2018   Treated with radiation   HOSPITAL COURSE:  Chief complaint; rectal bleed  History of presenting complaint; [name] [name]  is a 73 y.o. male with a known history of remote DVT on Coumadin, hypertension, diverticular bleed in 2013 that resolved on its own presented to the emergency room complaining of 3 days of bright red blood per rectum. Does not take any NSAIDs.  Was admitted to medical service for further evaluation and management.   Hospital course; Lower GI bleed Patient presented with bright red blood per rectum. No further bleeding since admission.  Hemoglobin stable at 11.2 today.  Patient was on Coumadin which was placed on hold.  INR was 1.5 today. Patient had CT scan of the abdomen and pelvis done with findings consistent with colonic diverticulosis. Patient seen by gastroenterologist.    EGD done reviewed normal duodenal bulb and second portion of the duodenum. Normal stomach. Biopsied. Normal gastroesophageal junction and esophagus.  To follow-up with gastroenterology for pathology results. Patient also had colonoscopy done which revealed one 8 mm polyp in the cecum, removed with mucosal resection. Resected and retrieved. 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 Diverticulosis in the sigmoid colon.  Plan for discharge by gastroenterologist.  Resume regular consistency diet.  Recommendation is to resume anticoagulation with  Coumadin tomorrow. Repeat colonoscopy in 5 years for surveillance based on pathology results. Flex sig as needed to treat severe rectal bleeding from radiation proctitis  *Hypertension.  Controlled. Continue home medications  *History of DVT Coumadin was placed on hold due to recent GI bleed.  No further bleeding.  Patient advised to resume Coumadin tomorrow. Clinically and hemodynamically stable for discharge.  DISCHARGE CONDITIONS:  Stable CONSULTS OBTAINED:  Treatment Team:  Lin Landsman, MD DRUG ALLERGIES:   Allergies  Allergen Reactions  . Codeine Nausea Only  . Sulfa Antibiotics Nausea And Vomiting   DISCHARGE MEDICATIONS:   Allergies as of 07/02/2019      Reactions   Codeine Nausea Only   Sulfa Antibiotics Nausea And Vomiting      Medication List    TAKE these medications   Cyanocobalamin 2500 MCG Subl Take 2,500 mcg by mouth daily.   pantoprazole 40 MG tablet Commonly known as: Protonix Take 1 tablet (40 mg total) by mouth daily.   tamsulosin 0.4 MG Caps capsule Commonly known as: FLOMAX Take 0.4 mg by mouth daily.   triamterene-hydrochlorothiazide 37.5-25 MG tablet Commonly known as: MAXZIDE-25 Take 1 tablet by mouth daily.   warfarin 4 MG tablet Commonly known as: COUMADIN Take 4 mg by mouth daily.        DISCHARGE INSTRUCTIONS:   DIET:  Cardiac diet DISCHARGE CONDITION:  Stable ACTIVITY:  Activity as tolerated OXYGEN:  Home Oxygen: No.  Oxygen Delivery: room air DISCHARGE LOCATION:  home   If you experience worsening of your admission symptoms, develop shortness of breath, life threatening emergency, suicidal or homicidal thoughts you  must seek medical attention immediately by calling 911 or calling your MD immediately  if symptoms less severe.  You Must read complete instructions/literature along with all the possible adverse reactions/side effects for all the Medicines you take and that have been prescribed to you. Take any  new Medicines after you have completely understood and accpet all the possible adverse reactions/side effects.   Please note  You were cared for by a hospitalist during your hospital stay. If you have any questions about your discharge medications or the care you received while you were in the hospital after you are discharged, you can call the unit and asked to speak with the hospitalist on call if the hospitalist that took care of you is not available. Once you are discharged, your primary care physician will handle any further medical issues. Please note that NO REFILLS for any discharge medications will be authorized once you are discharged, as it is imperative that you return to your primary care physician (or establish a relationship with a primary care physician if you do not have one) for your aftercare needs so that they can reassess your need for medications and monitor your lab values.    On the day of Discharge:  VITAL SIGNS:  Blood pressure (!) 122/50, pulse 60, temperature 97.7 F (36.5 C), temperature source Oral, resp. rate 18, height 5\' 10"  (1.778 m), weight 91.5 kg, SpO2 99 %. PHYSICAL EXAMINATION:  GENERAL:  73 y.o.-year-old patient lying in the bed with no acute distress.  EYES: Pupils equal, round, reactive to light and accommodation. No scleral icterus. Extraocular muscles intact.  HEENT: Head atraumatic, normocephalic. Oropharynx and nasopharynx clear.  NECK:  Supple, no jugular venous distention. No thyroid enlargement, no tenderness.  LUNGS: Normal breath sounds bilaterally, no wheezing, rales,rhonchi or crepitation. No use of accessory muscles of respiration.  CARDIOVASCULAR: S1, S2 normal. No murmurs, rubs, or gallops.  ABDOMEN: Soft, non-tender, non-distended. Bowel sounds present. No organomegaly or mass.  EXTREMITIES: No pedal edema, cyanosis, or clubbing.  NEUROLOGIC: Cranial nerves II through XII are intact. Muscle strength 5/5 in all extremities. Sensation  intact. Gait not checked.  PSYCHIATRIC: The patient is alert and oriented x 3.  SKIN: No obvious rash, lesion, or ulcer.  DATA REVIEW:   CBC Recent Labs  Lab 07/02/19 0554  WBC 7.0  HGB 11.2*  HCT 34.3*  PLT 226    Chemistries  Recent Labs  Lab 06/30/19 1720  07/02/19 0554  NA 138   < > 140  K 4.1   < > 3.5  CL 102   < > 109  CO2 27   < > 26  GLUCOSE 103*   < > 97  BUN 19   < > 14  CREATININE 1.05   < > 1.01  CALCIUM 9.2   < > 9.0  MG  --   --  2.2  AST 20  --   --   ALT 14  --   --   ALKPHOS 53  --   --   BILITOT 1.0  --   --    < > = values in this interval not displayed.     Microbiology Results  Results for orders placed or performed during the hospital encounter of 06/30/19  SARS Coronavirus 2 [facility] order, Performed in [facility] hospital lab) Nasopharyngeal Nasopharyngeal Swab     Status: None   Collection Time: 06/30/19  8:47 PM   Specimen: Nasopharyngeal Swab  Result Value Ref Range  Status   SARS Coronavirus 2 NEGATIVE NEGATIVE Final    Comment: (NOTE) If result is NEGATIVE SARS-CoV-2 target nucleic acids are NOT DETECTED. The SARS-CoV-2 RNA is generally detectable in upper and lower  respiratory specimens during the acute phase of infection. The lowest  concentration of SARS-CoV-2 viral copies this assay can detect is 250  copies / mL. A negative result does not preclude SARS-CoV-2 infection  and should not be used as the sole basis for treatment or other  patient management decisions.  A negative result may occur with  improper specimen collection / handling, submission of specimen other  than nasopharyngeal swab, presence of viral mutation(s) within the  areas targeted by this assay, and inadequate number of viral copies  (<250 copies / mL). A negative result must be combined with clinical  observations, patient history, and epidemiological information. If result is POSITIVE SARS-CoV-2 target nucleic acids are DETECTED. The SARS-CoV-2 RNA is  generally detectable in upper and lower  respiratory specimens dur ing the acute phase of infection.  Positive  results are indicative of active infection with SARS-CoV-2.  Clinical  correlation with patient history and other diagnostic information is  necessary to determine patient infection status.  Positive results do  not rule out bacterial infection or co-infection with other viruses. If result is PRESUMPTIVE POSTIVE SARS-CoV-2 nucleic acids MAY BE PRESENT.   A presumptive positive result was obtained on the submitted specimen  and confirmed on repeat testing.  While 2019 novel coronavirus  (SARS-CoV-2) nucleic acids may be present in the submitted sample  additional confirmatory testing may be necessary for epidemiological  and / or clinical management purposes  to differentiate between  SARS-CoV-2 and other Sarbecovirus currently known to infect humans.  If clinically indicated additional testing with an alternate test  methodology [phone]) is advised. The SARS-CoV-2 RNA is generally  detectable in upper and lower respiratory sp ecimens during the acute  phase of infection. The expected result is Negative. Fact Sheet for Patients:  StrictlyIdeas.no Fact Sheet for Healthcare Providers: BankingDealers.co.za This test is not yet approved or cleared by the [place] FDA and has been authorized for detection and/or diagnosis of SARS-CoV-2 by FDA under an Emergency Use Authorization (EUA).  This EUA will remain in effect (meaning this test can be used) for the duration of the COVID-19 declaration under Section 564(b)(1) of the Act, 21 U.S.C. section 360bbb-3(b)(1), unless the authorization is terminated or revoked sooner. Performed at [facility], [address] [place], [place] [address]     RADIOLOGY:  No results found.   Management plans discussed with the patient, family and they are in agreement.  CODE  STATUS: Full Code   TOTAL TIME TAKING CARE OF THIS PATIENT: 38 minutes.    Navdeep Halt M.D on 07/02/2019 at 12:17 PM  Between 7am to 6pm - Pager - [phone]  After 6pm go to [email or web address] - password [email or web address] [facility]  [facility] [place] [facility]  Office  [phone]  CC: Primary care physician; Adin Hector, MD   Note: This dictation was prepared with Dragon dictation along with smaller phrase technology. Any transcriptional errors that result from this process are unintentional.

## 2019-07-02 NOTE — Op Note (Signed)
[facility] Patient Name: [name] [name] Procedure Date: 07/02/2019 9:35 AM MRN: [id number] Account #: [id number] Date of Birth: [birth year] Admit Type: Inpatient Age: 73 Room: [facility] ENDO ROOM 4 Gender: Male Note Status: Finalized Procedure:            Upper GI endoscopy Indications:          Epigastric abdominal pain, Abdominal bloating Providers:            Lin Landsman MD, MD Referring MD:         Ramonita Lab, MD (Referring MD) Medicines:            Monitored Anesthesia Care Complications:        No immediate complications. Estimated blood loss: None. Procedure:            Pre-Anesthesia Assessment:                       - Prior to the procedure, a History and Physical was                        performed, and patient medications and allergies were                        reviewed. The patient is competent. The risks and                        benefits of the procedure and the sedation options and                        risks were discussed with the patient. All questions                        were answered and informed consent was obtained.                        Patient identification and proposed procedure were                        verified by the physician, the nurse, the                        anesthesiologist, the anesthetist and the technician in                        the pre-procedure area in the procedure room in the                        endoscopy suite. Mental Status Examination: alert and                        oriented. Airway Examination: normal oropharyngeal                        airway and neck mobility. Respiratory Examination:                        clear to auscultation. CV Examination: normal.                        Prophylactic Antibiotics: The patient does not require  prophylactic antibiotics. Prior Anticoagulants: The                        patient has taken Coumadin (warfarin), last dose was 2                       days prior to procedure. ASA Grade Assessment: III - A                        patient with severe systemic disease. After reviewing                        the risks and benefits, the patient was deemed in                        satisfactory condition to undergo the procedure. The                        anesthesia plan was to use monitored anesthesia care                        (MAC). Immediately prior to administration of                        medications, the patient was re-assessed for adequacy                        to receive sedatives. The heart rate, respiratory rate,                        oxygen saturations, blood pressure, adequacy of                        pulmonary ventilation, and response to care were                        monitored throughout the procedure. The physical status                        of the patient was re-assessed after the procedure.                       After obtaining informed consent, the endoscope was                        passed under direct vision. Throughout the procedure,                        the patient's blood pressure, pulse, and oxygen                        saturations were monitored continuously. The Endoscope                        was introduced through the mouth, and advanced to the                        second part of duodenum. The upper GI endoscopy was  accomplished without difficulty. The patient tolerated                        the procedure well. Findings:      The duodenal bulb and second portion of the duodenum were normal.      The entire examined stomach was normal. Biopsies were taken with a cold       forceps for Helicobacter pylori testing.      The gastroesophageal junction and examined esophagus were normal. Impression:           - Normal duodenal bulb and second portion of the                        duodenum.                       - Normal stomach. Biopsied.                        - Normal gastroesophageal junction and esophagus. Recommendation:       - Await pathology results.                       - Proceed with colonoscopy as scheduled                       See colonoscopy report Procedure Code(s):    --- Professional ---                       [phone], Esophagogastroduodenoscopy, flexible, transoral;                        with biopsy, single or multiple Diagnosis Code(s):    --- Professional ---                       R10.13, Epigastric pain                       R14.0, Abdominal distension (gaseous) CPT copyright 2019 American Medical Association. All rights reserved. The codes documented in this report are preliminary and upon coder review may  be revised to meet current compliance requirements. Dr. Ulyess Mort Lin Landsman MD, MD 07/02/2019 10:58:14 AM This report has been signed electronically. Number of Addenda: 0 Note Initiated On: 07/02/2019 9:35 AM Estimated Blood Loss: Estimated blood loss: none.      [facility]

## 2019-07-02 NOTE — Transfer of Care (Signed)
Immediate Anesthesia Transfer of Care Note  Patient: [name] [name]  Procedure(s) Performed: ESOPHAGOGASTRODUODENOSCOPY (EGD) (N/A ) COLONOSCOPY (N/A )  Patient Location: PACU  Anesthesia Type:General  Level of Consciousness: awake, alert  and oriented  Airway & Oxygen Therapy: Patient Spontanous Breathing and Patient connected to nasal cannula oxygen  Post-op Assessment: Report given to RN and Post -op Vital signs reviewed and stable  Post vital signs: Reviewed and stable  Last Vitals:  Vitals Value Taken Time  BP    Temp    Pulse    Resp    SpO2      Last Pain:  Vitals:   07/02/19 1025  TempSrc: Tympanic  PainSc:          Complications: No apparent anesthesia complications

## 2019-07-02 NOTE — Op Note (Signed)
[facility] Gastroenterology Patient Name: [name] [name] Procedure Date: 07/02/2019 9:32 AM MRN: [id number] Account #: [id number] Date of Birth: 03/14/1946 Admit Type: Inpatient Age: 73 Room: [facility] ENDO ROOM 4 Gender: Male Note Status: Finalized Procedure:            Colonoscopy Indications:          Last colonoscopy: July 2011, Rectal bleeding Providers:            Lin Landsman MD, MD Referring MD:         Ramonita Lab, MD (Referring MD) Medicines:            Monitored Anesthesia Care Complications:        No immediate complications. Estimated blood loss: None. Procedure:            Pre-Anesthesia Assessment:                       - Prior to the procedure, a History and Physical was                        performed, and patient medications and allergies were                        reviewed. The patient is competent. The risks and                        benefits of the procedure and the sedation options and                        risks were discussed with the patient. All questions                        were answered and informed consent was obtained.                        Patient identification and proposed procedure were                        verified by the physician, the nurse, the                        anesthesiologist, the anesthetist and the technician in                        the pre-procedure area in the procedure room in the                        endoscopy suite. Mental Status Examination: alert and                        oriented. Airway Examination: normal oropharyngeal                        airway and neck mobility. Respiratory Examination:                        clear to auscultation. CV Examination: normal.                        Prophylactic Antibiotics: The patient does not require  prophylactic antibiotics. Prior Anticoagulants: The                        patient has taken Coumadin (warfarin), last dose was 2                         days prior to procedure. ASA Grade Assessment: III - A                        patient with severe systemic disease. After reviewing                        the risks and benefits, the patient was deemed in                        satisfactory condition to undergo the procedure. The                        anesthesia plan was to use monitored anesthesia care                        (MAC). Immediately prior to administration of                        medications, the patient was re-assessed for adequacy                        to receive sedatives. The heart rate, respiratory rate,                        oxygen saturations, blood pressure, adequacy of                        pulmonary ventilation, and response to care were                        monitored throughout the procedure. The physical status                        of the patient was re-assessed after the procedure.                       After obtaining informed consent, the colonoscope was                        passed under direct vision. Throughout the procedure,                        the patient's blood pressure, pulse, and oxygen                        saturations were monitored continuously. The                        Colonoscope was introduced through the anus and                        advanced to the the cecum, identified by appendiceal  orifice and ileocecal valve. The colonoscopy was                        performed without difficulty. The patient tolerated the                        procedure well. The quality of the bowel preparation                        was good. Findings:      The perianal and digital rectal examinations were normal. Pertinent       negatives include normal sphincter tone and no palpable rectal lesions.      A 8 mm polyp was found in the cecum. The polyp was flat. Preparations       were made for mucosal resection. Saline was injected to raise the       lesion. Snare  mucosal resection was performed. Resection and retrieval       were complete. To prevent bleeding after mucosal resection, one       hemostatic clip was successfully placed (MR conditional). There was no       bleeding during, or at the end, of the procedure.      Multiple small diffuse angioectasias without bleeding were found in the       distal rectum consistent with radiation proctitis. Coagulation for       hemostasis using argon plasma was successful.      Multiple diverticula were found in the sigmoid colon. Impression:           - One 8 mm polyp in the cecum, removed with mucosal                        resection. Resected and retrieved. Clip (MR                        conditional) was placed.                       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                       - Diverticulosis in the sigmoid colon.                       - Mucosal resection was performed. Resection and                        retrieval were complete. Recommendation:       - Return patient to hospital ward for possible                        discharge same day.                       - Low sodium diet today.                       - Continue present medications.                       -  Await pathology results.                       - Resume Coumadin (warfarin) at prior dose tomorrow.                        Refer to managing physician for further adjustment of                        therapy.                       - Repeat colonoscopy in 5 years for surveillance based                        on pathology results.                       - Flex sig as needed to treat severe rectal bleeding                        from radiation proctitis Procedure Code(s):    --- Professional ---                       [phone], Colonoscopy, flexible; with endoscopic mucosal                        resection                       [id number], 61, Colonoscopy, flexible; with control of                         bleeding, any method Diagnosis Code(s):    --- Professional ---                       K63.5, Polyp of colon                       K55.20, Angiodysplasia of colon without hemorrhage                       K62.5, Hemorrhage of anus and rectum                       K57.30, Diverticulosis of large intestine without                        perforation or abscess without bleeding CPT copyright 2019 American Medical Association. All rights reserved. The codes documented in this report are preliminary and upon coder review may  be revised to meet current compliance requirements. Dr. Ulyess Mort Lin Landsman MD, MD 07/02/2019 11:31:25 AM This report has been signed electronically. Number of Addenda: 0 Note Initiated On: 07/02/2019 9:32 AM Scope Withdrawal Time: 0 hours 21 minutes 19 seconds  Total Procedure Duration: 0 hours 25 minutes 50 seconds  Estimated Blood Loss: Estimated blood loss: none.      [facility]

## 2019-07-03 ENCOUNTER — Encounter: Payer: Self-pay | Admitting: Gastroenterology

## 2019-07-03 NOTE — Anesthesia Postprocedure Evaluation (Signed)
Anesthesia Post Note  Patient: [name] [name]  Procedure(s) Performed: ESOPHAGOGASTRODUODENOSCOPY (EGD) (N/A ) COLONOSCOPY (N/A )  Patient location during evaluation: Endoscopy Anesthesia Type: General Level of consciousness: awake and alert Pain management: pain level controlled Vital Signs Assessment: post-procedure vital signs reviewed and stable Respiratory status: spontaneous breathing, nonlabored ventilation and respiratory function stable Cardiovascular status: blood pressure returned to baseline and stable Postop Assessment: no apparent nausea or vomiting Anesthetic complications: no     Last Vitals:  Vitals:   07/02/19 1155 07/02/19 1207  BP: (!) 122/57 (!) 122/50  Pulse: 68 60  Resp: 18 18  Temp:  36.5 C  SpO2: 100% 99%    Last Pain:  Vitals:   07/02/19 1207  TempSrc: Oral  PainSc:                  Alphonsus Sias

## 2019-07-04 ENCOUNTER — Encounter: Payer: Self-pay | Admitting: Gastroenterology

## 2019-07-04 LAB — SURGICAL PATHOLOGY

## 2019-07-26 ENCOUNTER — Ambulatory Visit (INDEPENDENT_AMBULATORY_CARE_PROVIDER_SITE_OTHER): Payer: Medicare HMO | Admitting: Gastroenterology

## 2019-07-26 ENCOUNTER — Encounter: Payer: Self-pay | Admitting: Gastroenterology

## 2019-07-26 ENCOUNTER — Other Ambulatory Visit: Payer: Self-pay

## 2019-07-26 VITALS — BP 143/79 | HR 78 | Temp 97.7°F | Resp 17 | Wt 206.4 lb

## 2019-07-26 DIAGNOSIS — K627 Radiation proctitis: Secondary | ICD-10-CM | POA: Diagnosis not present

## 2019-07-26 DIAGNOSIS — D500 Iron deficiency anemia secondary to blood loss (chronic): Secondary | ICD-10-CM

## 2019-07-26 DIAGNOSIS — K625 Hemorrhage of anus and rectum: Secondary | ICD-10-CM | POA: Diagnosis not present

## 2019-07-26 DIAGNOSIS — Z86718 Personal history of other venous thrombosis and embolism: Secondary | ICD-10-CM | POA: Insufficient documentation

## 2019-07-26 DIAGNOSIS — E785 Hyperlipidemia, unspecified: Secondary | ICD-10-CM | POA: Insufficient documentation

## 2019-07-26 NOTE — Progress Notes (Signed)
[name] Darby, MD [address]  [place]  [place], [place] [address]  Main: [phone]  Fax: [phone]    Gastroenterology Consultation  Referring Provider:     Adin Hector, MD Primary Care Physician:  Adin Hector, MD Primary Gastroenterologist:  Dr. Cephas [name] Reason for Consultation:     Rectal bleeding, hospital follow-up        HPI:   [name] [name] is a 73 y.o. male referred by Dr. Caryl Comes, Tonette Bihari, MD  for consultation & management of rectal bleeding.  Patient has history of DVT on Coumadin, history of radiation proctitis for prostate cancer in 2018.  Patient was admitted to [facility] on 07/01/2019 secondary to bright red blood per rectum mixed with stool.  His hemoglobin dropped from 12.2 to 10.5 which improved on its own 11.4 in 24 hours.  He underwent colonoscopy and was found to have radiation proctitis that was treated with APC.  Patient's rectal bleeding resolved before the colonoscopy itself.  Interval summary He denies recurrence of rectal bleeding since discharge.  He has been doing well.  He denies any GI symptoms today.  He denies fatigue, shortness of breath, chest pain  NSAIDs: None  Antiplts/Anticoagulants/Anti thrombotics: On Coumadin for history of DVT  GI Procedures: EGD and colonoscopy 07/02/2019 Normal duodenal bulb and second portion of the duodenum. - Normal stomach. Biopsied. - Normal gastroesophageal junction and esophagus.   - One 8 mm polyp in the cecum, removed with mucosal resection. Resected and retrieved. Clip (MR conditional) was placed. 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 - Diverticulosis in the sigmoid colon. - Mucosal resection was performed. Resection and retrieval were complete.  DIAGNOSIS:  A. STOMACH, RANDOM; COLD BIOPSY:  - MILD NONSPECIFIC CHRONIC GASTRITIS.  - NEGATIVE FOR ACTIVE INFLAMMATION AND H PYLORI.  - NEGATIVE FOR INTESTINAL METAPLASIA, DYSPLASIA, AND MALIGNANCY.    B. COLON POLYP, CECUM; HOT SNARE:  - TUBULAR ADENOMA.  - NEGATIVE FOR HIGH-GRADE DYSPLASIA AND MALIGNANCY.   Past Medical History:  Diagnosis Date  . DVT (deep venous thrombosis) ([place])   . Hypertension   . Prostate cancer ([place]) 2018   Treated with radiation    Past Surgical History:  Procedure Laterality Date  . COLONOSCOPY N/A 07/02/2019   Procedure: COLONOSCOPY;  Surgeon: Lin Landsman, MD;  Location: [facility] ENDOSCOPY;  Service: Gastroenterology;  Laterality: N/A;  . ESOPHAGOGASTRODUODENOSCOPY N/A 07/02/2019   Procedure: ESOPHAGOGASTRODUODENOSCOPY (EGD);  Surgeon: Lin Landsman, MD;  Location: [facility] ENDOSCOPY;  Service: Gastroenterology;  Laterality: N/A;  . FOOT FRACTURE SURGERY Right 2012   Heel fracture repair, fell off ladder  . HERNIA REPAIR     Current Outpatient Medications:  .  Cyanocobalamin 2500 MCG SUBL, Take 2,500 mcg by mouth daily., Disp: , Rfl:  .  pantoprazole (PROTONIX) 40 MG tablet, Take 1 tablet (40 mg total) by mouth daily., Disp: 30 tablet, Rfl: 0 .  tamsulosin (FLOMAX) 0.4 MG CAPS capsule, Take 0.4 mg by mouth daily., Disp: , Rfl:  .  triamterene-hydrochlorothiazide (MAXZIDE-25) 37.5-25 MG tablet, Take 1 tablet by mouth daily., Disp: , Rfl:  .  warfarin (COUMADIN) 4 MG tablet, Take 4 mg by mouth daily., Disp: , Rfl:  .  FLUZONE HIGH-DOSE QUADRIVALENT 0.7 ML SUSY, PHARMACY ADMINISTERED, Disp: , Rfl:   No family history on file.   Social History   Tobacco Use  . Smoking status: Never Smoker  . Smokeless tobacco: Never Used  Substance Use Topics  .  Alcohol use: Not Currently  . Drug use: Not Currently    Allergies as of 07/26/2019 - Review Complete 07/26/2019  Allergen Reaction Noted  . Codeine Nausea Only 06/28/2014  . Sulfa antibiotics Nausea And Vomiting 04/16/2014    Review of Systems:    All systems reviewed and negative except where noted in HPI.   Physical Exam:  BP (!) 143/79 (BP Location: Left Arm, Patient Position: Sitting,  Cuff Size: Large)   Pulse 78   Temp 97.7 F (36.5 C)   Resp 17   Wt 206 lb 6.4 oz (93.6 kg)   BMI 29.62 kg/m  No LMP for male patient.  General:   Alert,  Well-developed, well-nourished, pleasant and cooperative in NAD Head:  Normocephalic and atraumatic. Eyes:  Sclera clear, no icterus.   Conjunctiva pink. Ears:  Normal auditory acuity. Nose:  No deformity, discharge, or lesions. Mouth:  No deformity or lesions,oropharynx pink & moist. Neck:  Supple; no masses or thyromegaly. Lungs:  Respirations even and unlabored.  Clear throughout to auscultation.   No wheezes, crackles, or rhonchi. No acute distress. Heart:  Regular rate and rhythm; no murmurs, clicks, rubs, or gallops. Abdomen:  Normal bowel sounds. Soft, non-tender and non-distended without masses, hepatosplenomegaly or hernias noted.  No guarding or rebound tenderness.   Rectal: Not performed Msk:  Symmetrical without gross deformities. Good, equal movement & strength bilaterally. Pulses:  Normal pulses noted. Extremities:  No clubbing or edema.  No cyanosis. Neurologic:  Alert and oriented x3;  grossly normal neurologically. Skin:  Intact without significant lesions or rashes. No jaundice. Psych:  Alert and cooperative. Normal mood and affect.  Imaging Studies: Reviewed  Assessment and Plan:   [name] [name] is a 73 y.o. male with history of prostate cancer status post radiation, DVT on Coumadin is seen for follow-up of rectal bleeding secondary to radiation proctitis.  He had hemodynamically insignificant lower GI bleed.  Radiation proctitis was treated with APC.  He does have mild iron deficiency anemia.  Recommend to start oral iron 1 to 2 pills daily and stool softener as needed for constipation.  Recommend OTC and iron studies by PCP in 1 to 2 months.  Recommend flexible sigmoidoscopy as needed for rectal bleeding to treat radiation proctitis   Follow up as needed   [name] Darby, MD

## 2019-08-12 ENCOUNTER — Encounter: Payer: Self-pay | Admitting: Gastroenterology

## 2020-01-12 ENCOUNTER — Ambulatory Visit: Payer: Medicare HMO | Attending: Internal Medicine

## 2020-01-12 DIAGNOSIS — Z23 Encounter for immunization: Secondary | ICD-10-CM | POA: Insufficient documentation

## 2020-01-12 NOTE — Progress Notes (Signed)
   Covid-19 Vaccination Clinic  Name:  [name] [name]    MRN: [id number] DOB: Aug 20, 1946  01/12/2020  [name] was observed post Covid-19 immunization for 15 minutes without incidence. He was provided with Vaccine Information Sheet and instruction to access the V-Safe system.   [name] was instructed to call 911 with any severe reactions post vaccine: Marland Kitchen Difficulty breathing  . Swelling of your face and throat  . A fast heartbeat  . A bad rash all over your body  . Dizziness and weakness    Immunizations Administered    Name Date Dose VIS Date Route   Pfizer COVID-19 Vaccine 01/12/2020 10:10 AM 0.3 mL 10/27/2019 Intramuscular   Manufacturer: [place]   Lot: [id number]   [place]: [id number]

## 2020-02-06 ENCOUNTER — Ambulatory Visit: Payer: Medicare HMO | Attending: Internal Medicine

## 2020-02-06 DIAGNOSIS — Z23 Encounter for immunization: Secondary | ICD-10-CM

## 2020-02-06 NOTE — Progress Notes (Signed)
   Covid-19 Vaccination Clinic  Name:  LACIE KULIGOWSKI    MRN: [id number] DOB: 06-Mar-1946  02/06/2020  Mr. Welcome was observed post Covid-19 immunization for 15 minutes without incident. He was provided with Vaccine Information Sheet and instruction to access the V-Safe system.   Mr. [place] was instructed to call 911 with any severe reactions post vaccine: Marland Kitchen Difficulty breathing  . Swelling of face and throat  . A fast heartbeat  . A bad rash all over body  . Dizziness and weakness   Immunizations Administered    Name Date Dose VIS Date Route   Pfizer COVID-19 Vaccine 02/06/2020 11:42 AM 0.3 mL 10/27/2019 Intramuscular   Manufacturer: [place]   Lot: [id number]   [place]: [id number]

## 2020-07-02 ENCOUNTER — Encounter (INDEPENDENT_AMBULATORY_CARE_PROVIDER_SITE_OTHER): Payer: Self-pay | Admitting: Vascular Surgery

## 2020-07-02 ENCOUNTER — Other Ambulatory Visit: Payer: Self-pay

## 2020-07-02 ENCOUNTER — Ambulatory Visit (INDEPENDENT_AMBULATORY_CARE_PROVIDER_SITE_OTHER): Payer: Medicare HMO | Admitting: Vascular Surgery

## 2020-07-02 VITALS — BP 153/72 | HR 75 | Resp 16 | Ht 70.0 in | Wt 208.0 lb

## 2020-07-02 DIAGNOSIS — M87051 Idiopathic aseptic necrosis of right femur: Secondary | ICD-10-CM | POA: Diagnosis not present

## 2020-07-02 DIAGNOSIS — I10 Essential (primary) hypertension: Secondary | ICD-10-CM | POA: Diagnosis not present

## 2020-07-02 DIAGNOSIS — E785 Hyperlipidemia, unspecified: Secondary | ICD-10-CM | POA: Diagnosis not present

## 2020-07-02 DIAGNOSIS — Z86718 Personal history of other venous thrombosis and embolism: Secondary | ICD-10-CM

## 2020-07-02 DIAGNOSIS — D500 Iron deficiency anemia secondary to blood loss (chronic): Secondary | ICD-10-CM | POA: Insufficient documentation

## 2020-07-02 DIAGNOSIS — D649 Anemia, unspecified: Secondary | ICD-10-CM | POA: Insufficient documentation

## 2020-07-02 NOTE — Assessment & Plan Note (Signed)
Upcoming right hip replacement which would certainly be high risk for thromboembolic complications particularly with his previous history of DVT.

## 2020-07-02 NOTE — Patient Instructions (Signed)
Inferior Vena Cava Filter Insertion Insertion of an inferior vena cava (IVC) filter is a procedure in which a filter is placed into the large vein in your abdomen that carries blood from the lower part of your body to your heart (inferior vena cava). This filter helps to prevent blood clots in the legs or pelvis from traveling to your lungs, which can be very dangerous. The filter is a small, metal device that is shaped like the spokes of an umbrella. The filter is inserted through a pathway that is created in the neck or groin. Filters are inserted when blood thinners (anticoagulants) cannot be used to prevent blood clots from forming. You may need filters rather than anticoagulants because you have:  Severe platelet problems or shortages.  Recent or current major bleeding that cannot be treated.  Bleeding associated with anticoagulants.  Recurrence of blood clots while taking anticoagulants.  A need for surgery in the near future.  Bleeding in your head.  Multiple broken bones. Tell a health care provider about:  Any allergies you have, including iodine.  All medicines you are taking, including vitamins, herbs, eye drops, creams, and over-the-counter medicines.  Any blood disorders you have.  Any problems you or family members have had with anesthetic medicines.  Any medical conditions you have.  Any surgeries you have had.  Whether you are pregnant or may be pregnant. What are the risks? Generally, this is a safe procedure. However, problems may occur, including:  The filter blocking the inferior vena cava. This can cause leg swelling.  The filter eventually failing and not working properly.  The filter moving and traveling to the heart or lungs.  Damage to the vein. This is rare.  Bleeding.  Allergic reactions to medicines or dyes.  Damage to other structures or organs.  Infection.  A pool of blood (hematoma) around the site where a flexible tube is put into a  large vein (catheter insertion site). What happens before the procedure? Staying hydrated Follow instructions from your health care provider about hydration, which may include:  Up to 2 hours before the procedure - you may continue to drink clear liquids, such as water, clear fruit juice, black coffee, and plain tea. Eating and drinking restrictions Follow instructions from your health care provider about eating and drinking, which may include:  8 hours before the procedure - stop eating heavy meals or foods such as meat, fried foods, or fatty foods.  6 hours before the procedure - stop eating light meals or foods, such as toast or cereal.  6 hours before the procedure - stop drinking milk or drinks that contain milk.  2 hours before the procedure - stop drinking clear liquids. Medicines   Ask your health care provider about: ? Changing or stopping your regular medicines. This is especially important if you are taking diabetes medicines or blood thinners. ? Taking medicines such as aspirin and ibuprofen. These medicines can thin your blood. Do not take these medicines before your procedure if your health care provider instructs you not to.  You may be given antibiotic medicine to help prevent infection. General instructions  Ask your health care provider how your surgical site will be marked or identified.  You may have blood tests. These tests can help to tell how well your kidneys and liver are working. They can also show how fast your blood is clotting.  You may be asked to shower with a germ-killing soap.  Plan to have someone take you   home from the hospital or clinic.  If you will be going home right after the procedure, plan to have someone with you for 24 hours.  Do not use any products that contain nicotine or tobacco, such as cigarettes and e-cigarettes. If you need help quitting, ask your health care provider. What happens during the procedure?  To lower your risk of  infection: ? Your health care team will wash or sanitize their hands. ? Your skin will be washed with soap. ? Hair may be removed from the surgical area.  An IV tube will be inserted into one of your veins.  You will be given one or more of the following: ? A medicine to help you relax (sedative). ? A medicine to numb the area (local anesthetic).  The procedure is done through a large vein in your neck or groin. A small cut (incision) will be made in this area.  A flexible tube (catheter) will be put into a large vein where the incision was made.  Contrast dye may be injected into the inferior vena cava to help guide the catheter.  X-rays may be used to make sure that the catheter is in the correct position.  The IVC filter will be inserted into the vein through the catheter until it reaches the correct location in the inferior vena cava.  The catheter will be removed.  Pressure will be applied to the insertion site to stop bleeding.  A bandage (dressing) may be applied over the catheter insertion site.  Your IV tube will be taken out. The procedure may vary among health care providers and hospitals. What happens after the procedure?  Your blood pressure, heart rate, breathing rate, and blood oxygen level will be monitored until the medicines you were given have worn off.  Your insertion site will be monitored for the first few hours for any signs of bleeding.  Do not drive for 24 hours if you were given a sedative. Summary  The inferior vena cava filter helps to prevent blood clots in the legs or pelvis from traveling to your lungs.  The IVC filter is inserted when anticoagulants cannot be used to prevent blood clots.  Plan to have someone take you home from the hospital or clinic, and plan to have someone with you for 24 hours if you will be going home right after the procedure. This information is not intended to replace advice given to you by your health care provider.  Make sure you discuss any questions you have with your health care provider. Document Revised: 10/15/2017 Document Reviewed: 09/23/2016 Elsevier Patient Education  2020 [employer]  

## 2020-07-02 NOTE — Progress Notes (Signed)
Patient ID: [name] [name], male   DOB: 03-11-1946, 74 y.o.   MRN: [id number]  Chief Complaint  Patient presents with   New Patient (Initial Visit)    ref Caryl Comes discuss ivc placements    HPI [name] [name] is a 74 y.o. male.  I am asked to see the patient by Dr. Caryl Comes and Dr. Marry Guan for evaluation of placement of an IVC filter preliminary to an upcoming hip replacement in early October.  Patient has fairly significant stasis dermatitis and residual varicosities particularly in the left lower extremity after a previous DVT.  Stasis dermatitis changes are significant on the right as well.  He has had significant arthritic hip issues and was actually scheduled to have a hip replacement last September but COVID-19 delayed that.  He now has a scheduled for the first week of October on Monday, October 4, and preliminary to having this done given his high risk status with the previous DVT, a request for an IVC filter was made appropriately by his orthopedist and primary care physician.  He has no complaints today other than his arthritis and chronic leg swelling and discoloration.  No recent chest pain or shortness of breath.  No fever or chills.     Past Medical History:  Diagnosis Date   DVT (deep venous thrombosis) ([place])    Hypertension    Prostate cancer ([place]) 2018   Treated with radiation    Past Surgical History:  Procedure Laterality Date   COLONOSCOPY N/A 07/02/2019   Procedure: COLONOSCOPY;  Surgeon: Lin Landsman, MD;  Location: [facility] ENDOSCOPY;  Service: Gastroenterology;  Laterality: N/A;   ESOPHAGOGASTRODUODENOSCOPY N/A 07/02/2019   Procedure: ESOPHAGOGASTRODUODENOSCOPY (EGD);  Surgeon: Lin Landsman, MD;  Location: [facility] ENDOSCOPY;  Service: Gastroenterology;  Laterality: N/A;   FOOT FRACTURE SURGERY Right 2012   Heel fracture repair, fell off ladder   HERNIA REPAIR       Family History  Problem Relation Age of Onset   Heart attack Father   Bleeding disorders,  clotting disorders, autoimmune diseases, or aneurysms   Social History   Tobacco Use   Smoking status: Never Smoker   Smokeless tobacco: Never Used  Substance Use Topics   Alcohol use: Not Currently   Drug use: Not Currently  Married  Allergies  Allergen Reactions   Codeine Nausea Only   Sulfa Antibiotics Nausea And Vomiting    Current Outpatient Medications  Medication Sig Dispense Refill   Cyanocobalamin 2500 MCG SUBL Take 2,500 mcg by mouth daily.     Multiple Vitamin (MULTIVITAMIN PO) Take by mouth.     tamsulosin (FLOMAX) 0.4 MG CAPS capsule Take 0.4 mg by mouth daily.     triamterene-hydrochlorothiazide (MAXZIDE-25) 37.5-25 MG tablet Take 1 tablet by mouth daily.     warfarin (COUMADIN) 4 MG tablet Take 4 mg by mouth daily.     FLUZONE HIGH-DOSE QUADRIVALENT 0.7 ML [facility] ADMINISTERED (Patient not taking: Reported on 07/02/2020)     pantoprazole (PROTONIX) 40 MG tablet Take 1 tablet (40 mg total) by mouth daily. 30 tablet 0   No current facility-administered medications for this visit.      REVIEW OF SYSTEMS (Negative unless checked)  Constitutional: [] Weight loss  [] Fever  [] Chills Cardiac: [] Chest pain   [] Chest pressure   [] Palpitations   [] Shortness of breath when laying flat   [] Shortness of breath at rest   [] Shortness of breath with exertion. Vascular:  [] Pain in legs with walking   [] Pain  in legs at rest   [] Pain in legs when laying flat   [] Claudication   [] Pain in feet when walking  [] Pain in feet at rest  [] Pain in feet when laying flat   [x] History of DVT   [x] Phlebitis   [x] Swelling in legs   [] Varicose veins   [] Non-healing ulcers Pulmonary:   [] Uses home oxygen   [] Productive cough   [] Hemoptysis   [] Wheeze  [] COPD   [] Asthma Neurologic:  [] Dizziness  [] Blackouts   [] Seizures   [] History of stroke   [] History of TIA  [] Aphasia   [] Temporary blindness   [] Dysphagia   [] Weakness or numbness in arms   [] Weakness or numbness in  legs Musculoskeletal:  [x] Arthritis   [] Joint swelling   [x] Joint pain   [] Low back pain Hematologic:  [] Easy bruising  [] Easy bleeding   [] Hypercoagulable state   [] Anemic  [] Hepatitis Gastrointestinal:  [] Blood in stool   [] Vomiting blood  [] Gastroesophageal reflux/heartburn   [] Abdominal pain Genitourinary:  [] Chronic kidney disease   [] Difficult urination  [] Frequent urination  [] Burning with urination   [] Hematuria Skin:  [] Rashes   [] Ulcers   [] Wounds Psychological:  [] History of anxiety   []  History of major depression.    Physical Exam BP (!) 153/72 (BP Location: Left Arm)    Pulse 75    Resp 16    Ht 5\' 10"  (1.778 m)    Wt 208 lb (94.3 kg)    BMI 29.84 kg/m  Gen:  WD/WN, NAD Head: [place]/AT, No temporalis wasting.  Ear/Nose/Throat: Hearing grossly intact, nares w/o erythema or drainage, oropharynx w/o Erythema/Exudate Eyes: Conjunctiva clear, sclera non-icteric  Neck: trachea midline.  No JVD.  Pulmonary:  Good air movement, respirations not labored, no use of accessory muscles  Cardiac: RRR, no JVD Vascular:  Vessel Right Left  Radial Palpable Palpable                                   Gastrointestinal:. No masses, surgical incisions, or scars. Musculoskeletal: M/S 5/5 throughout.  Extremities without ischemic changes.  No deformity or atrophy.  Moderate stasis dermatitis changes present a little worse on the left than the right.  Prominent varicosities are present again worse on the left than the right.  1-2+ bilateral lower extremity edema. Neurologic: Sensation grossly intact in extremities.  Symmetrical.  Speech is fluent. Motor exam as listed above. Psychiatric: Judgment intact, Mood & affect appropriate for pt's clinical situation. Dermatologic: No rashes or ulcers noted.  No cellulitis or open wounds.    Radiology No results found.  Labs No results found for this or any previous visit (from the past 2160 hour(s)).  Assessment/Plan:  Avascular necrosis of  bone of right hip (HCC) Upcoming right hip replacement which would certainly be high risk for thromboembolic complications particularly with his previous history of DVT.  Essential hypertension blood pressure control important in reducing the progression of atherosclerotic disease. On appropriate oral medications.   History of DVT (deep vein thrombosis) The patient had a postoperative DVT in the past.  He is now about to undergo a right hip replacement which would be one of the more high risk procedures for thromboembolic complications.  He will have to have his anticoagulation held for surgery and would be a high risk of thromboembolic complications perioperatively.  For this reason, placement and subsequent removal and IVC filter would be a reasonable option and one that I have offered  today.  I discussed the risks and benefits of this.  I discussed that some IVC filters do not come out and end up being a permanent implant.  I have discussed that this is rare particular if they are taken out in the first several months.  I have discussed this would not prevent a DVT, but would prevent the vast majority of DVTs from becoming pulmonary emboli.  The patient and his wife voiced their understanding and desire to have an IVC filter placed.  This will be done in the last week of September prior to his hip replacement on October 4.      Leotis Pain 07/02/2020, 4:56 PM   This note was created with Dragon medical transcription system.  Any errors from dictation are unintentional.

## 2020-07-02 NOTE — Assessment & Plan Note (Signed)
The patient had a postoperative DVT in the past.  He is now about to undergo a right hip replacement which would be one of the more high risk procedures for thromboembolic complications.  He will have to have his anticoagulation held for surgery and would be a high risk of thromboembolic complications perioperatively.  For this reason, placement and subsequent removal and IVC filter would be a reasonable option and one that I have offered today.  I discussed the risks and benefits of this.  I discussed that some IVC filters do not come out and end up being a permanent implant.  I have discussed that this is rare particular if they are taken out in the first several months.  I have discussed this would not prevent a DVT, but would prevent the vast majority of DVTs from becoming pulmonary emboli.  The patient and his wife voiced their understanding and desire to have an IVC filter placed.  This will be done in the last week of September prior to his hip replacement on October 4.

## 2020-07-02 NOTE — Assessment & Plan Note (Signed)
blood pressure control important in reducing the progression of atherosclerotic disease. On appropriate oral medications.  

## 2020-07-03 ENCOUNTER — Telehealth (INDEPENDENT_AMBULATORY_CARE_PROVIDER_SITE_OTHER): Payer: Self-pay

## 2020-07-03 NOTE — Telephone Encounter (Signed)
Spoke with the patient and he is scheduled with Dr. Lucky Cowboy on 08/15/20 for a IVC filter insertion on with a 6:45 am arrival time to the [facility]. Covid testing is on 08/13/20 between 8-1 pm at the [facility]. Pre-procedure instructions were discussed and will be mailed.

## 2020-08-13 ENCOUNTER — Other Ambulatory Visit: Payer: Medicare HMO

## 2020-08-14 ENCOUNTER — Other Ambulatory Visit (INDEPENDENT_AMBULATORY_CARE_PROVIDER_SITE_OTHER): Payer: Self-pay | Admitting: Nurse Practitioner

## 2020-08-15 ENCOUNTER — Ambulatory Visit: Admit: 2020-08-15 | Payer: Medicare HMO | Admitting: Vascular Surgery

## 2020-08-15 DIAGNOSIS — I82409 Acute embolism and thrombosis of unspecified deep veins of unspecified lower extremity: Secondary | ICD-10-CM

## 2020-08-15 SURGERY — IVC FILTER INSERTION
Anesthesia: Moderate Sedation

## 2020-09-03 ENCOUNTER — Telehealth (INDEPENDENT_AMBULATORY_CARE_PROVIDER_SITE_OTHER): Payer: Self-pay

## 2020-09-03 NOTE — Telephone Encounter (Signed)
Spoke with the patient and he is scheduled with Dr. Lucky Cowboy for a IVC filter placement on 09/23/20 with a 6:45 am arrival time to the [facility]. Covid testing on 09/19/20 between 8-1 pm at the [facility]. Pre-procedure instructions were discussed and will be mailed.

## 2020-09-18 NOTE — Discharge Instructions (Signed)
Instructions after Total Hip Replacement     [name] [name], Jr., M.D.     Dept. of Orthopaedics & Sports Medicine  [facility]  [address]  [place], [place]  [address]  Phone: [phone]   Fax: [phone]    DIET: . Drink plenty of non-alcoholic fluids. . Resume your normal diet. Include foods high in fiber.  ACTIVITY:  . You may use crutches or a walker with weight-bearing as tolerated, unless instructed otherwise. . You may be weaned off of the walker or crutches by your Physical Therapist.  . Do NOT reach below the level of your knees or cross your legs until allowed.    . Continue doing gentle exercises. Exercising will reduce the pain and swelling, increase motion, and prevent muscle weakness.   . Please continue to use the TED compression stockings for 6 weeks. You may remove the stockings at night, but should reapply them in the morning. . Do not drive or operate any equipment until instructed.  WOUND CARE:  . Continue to use ice packs periodically to reduce pain and swelling. . Keep the incision clean and [place]. . You may bathe or shower after the staples are removed at the first office visit following surgery.  MEDICATIONS: . You may resume your regular medications. . Please take the pain medication as prescribed on the medication. . Do not take pain medication on an empty stomach. . You have been given a prescription for a blood thinner to prevent blood clots. Please take the medication as instructed. (NOTE: After completing a 2 week course of Lovenox, take one Enteric-coated aspirin once a day.) . Pain medications and iron supplements can cause constipation. Use a stool softener (Senokot or Colace) on a daily basis and a laxative (dulcolax or miralax) as needed. . Do not drive or drink alcoholic beverages when taking pain medications.  CALL THE OFFICE FOR: . Temperature above 101 degrees . Excessive bleeding or drainage on the dressing. . Excessive  swelling, coldness, or paleness of the toes. . Persistent nausea and vomiting.  FOLLOW-UP:  . You should have an appointment to return to the office in 6 weeks after surgery. . Arrangements have been made for continuation of Physical Therapy (either home therapy or outpatient therapy).     [facility] Department Directory         [email or web address]       [email or web address]          Cardiology  Appointments: [place] - [phone] [place] - [phone]  Endocrinology  Appointments: [place] - [phone] [place] - [phone]  Gastroenterology  Appointments: [place] - [phone] [place] - [phone]        General Surgery   Appointments: [place] - [phone]  Internal Medicine/Family Medicine  Appointments: [place] - [phone] [place] - [phone] [place] - [phone]  Metabolic and Weigh Loss Surgery  Appointments: [place] - [phone]        Neurology  Appointments: [place] - [phone] [place] - [phone]  Neurosurgery  Appointments: [place] - [phone]  Obstetrics & Gynecology  Appointments: [place] - [phone] [place] - [phone]        Pediatrics  Appointments: [place] - [phone] [place] - [phone]  Physiatry  Appointments: [place] -[phone]  Physical Therapy  Appointments: [place] - [phone] [place] - [phone]        Podiatry  Appointments: [place] - [phone] [place] - [phone]  Pulmonology  Appointments: [place] - [phone]  Rheumatology  Appointments: [place] - [phone]         Location: [facility]  [address] [place], [place]  [address]  [place] Location: [facility] [address] [place], [place]  [address]  [place] Location: [facility] [address] [place], [place]  [address]    

## 2020-09-19 ENCOUNTER — Other Ambulatory Visit: Payer: Self-pay

## 2020-09-19 ENCOUNTER — Other Ambulatory Visit
Admission: RE | Admit: 2020-09-19 | Discharge: 2020-09-19 | Disposition: A | Discharge status: Home / Self Care | Payer: Medicare HMO | Source: Ambulatory Visit | Attending: Vascular Surgery | Admitting: Vascular Surgery

## 2020-09-19 DIAGNOSIS — Z01812 Encounter for preprocedural laboratory examination: Secondary | ICD-10-CM | POA: Diagnosis present

## 2020-09-19 DIAGNOSIS — Z20822 Contact with and (suspected) exposure to covid-19: Secondary | ICD-10-CM | POA: Diagnosis not present

## 2020-09-19 LAB — SARS CORONAVIRUS 2 (TAT 6-24 HRS): SARS Coronavirus 2: NEGATIVE

## 2020-09-20 ENCOUNTER — Encounter
Admission: RE | Admit: 2020-09-20 | Discharge: 2020-09-20 | Disposition: A | Discharge status: Home / Self Care | Payer: Medicare HMO | Source: Ambulatory Visit | Attending: Vascular Surgery | Admitting: Vascular Surgery

## 2020-09-20 DIAGNOSIS — I459 Conduction disorder, unspecified: Secondary | ICD-10-CM | POA: Diagnosis not present

## 2020-09-20 DIAGNOSIS — Z01818 Encounter for other preprocedural examination: Secondary | ICD-10-CM | POA: Diagnosis not present

## 2020-09-20 HISTORY — DX: Unspecified osteoarthritis, unspecified site: M19.90

## 2020-09-20 HISTORY — DX: Hemorrhage of anus and rectum: K62.5

## 2020-09-20 HISTORY — DX: Gastro-esophageal reflux disease without esophagitis: K21.9

## 2020-09-20 LAB — SEDIMENTATION RATE: Sed Rate: 5 mm/hr (ref 0–20)

## 2020-09-20 LAB — PROTIME-INR
INR: 2.6 — ABNORMAL HIGH (ref 0.8–1.2)
Prothrombin Time: 27.2 seconds — ABNORMAL HIGH (ref 11.4–15.2)

## 2020-09-20 LAB — URINALYSIS, ROUTINE W REFLEX MICROSCOPIC
Bilirubin Urine: NEGATIVE
Glucose, UA: NEGATIVE mg/dL
Hgb urine dipstick: NEGATIVE
Ketones, ur: NEGATIVE mg/dL
Leukocytes,Ua: NEGATIVE
Nitrite: NEGATIVE
Protein, ur: NEGATIVE mg/dL
Specific Gravity, Urine: 1.018 (ref 1.005–1.030)
pH: 7 (ref 5.0–8.0)

## 2020-09-20 LAB — CBC
HCT: 42.1 % (ref 39.0–52.0)
Hemoglobin: 14.2 g/dL (ref 13.0–17.0)
MCH: 30.7 pg (ref 26.0–34.0)
MCHC: 33.7 g/dL (ref 30.0–36.0)
MCV: 91.1 fL (ref 80.0–100.0)
Platelets: 228 10*3/uL (ref 150–400)
RBC: 4.62 MIL/uL (ref 4.22–5.81)
RDW: 14 % (ref 11.5–15.5)
WBC: 5 10*3/uL (ref 4.0–10.5)
nRBC: 0 % (ref 0.0–0.2)

## 2020-09-20 LAB — COMPREHENSIVE METABOLIC PANEL
ALT: 15 U/L (ref 0–44)
AST: 19 U/L (ref 15–41)
Albumin: 4.2 g/dL (ref 3.5–5.0)
Alkaline Phosphatase: 58 U/L (ref 38–126)
Anion gap: 7 (ref 5–15)
BUN: 17 mg/dL (ref 8–23)
CO2: 27 mmol/L (ref 22–32)
Calcium: 9.2 mg/dL (ref 8.9–10.3)
Chloride: 106 mmol/L (ref 98–111)
Creatinine, Ser: 1.04 mg/dL (ref 0.61–1.24)
GFR, Estimated: >60 mL/min (ref >60)
Glucose, Bld: 100 mg/dL — ABNORMAL HIGH (ref 70–99)
Potassium: 3.7 mmol/L (ref 3.5–5.1)
Sodium: 140 mmol/L (ref 135–145)
Total Bilirubin: 0.8 mg/dL (ref 0.3–1.2)
Total Protein: 6.9 g/dL (ref 6.5–8.1)

## 2020-09-20 LAB — TYPE AND SCREEN
ABO/RH(D): O POS
Antibody Screen: NEGATIVE

## 2020-09-20 LAB — C-REACTIVE PROTEIN: CRP: 1 mg/dL — ABNORMAL HIGH (ref <1.0)

## 2020-09-20 LAB — SURGICAL PCR SCREEN
MRSA, PCR: NEGATIVE
Staphylococcus aureus: NEGATIVE

## 2020-09-20 LAB — APTT: aPTT: 46 seconds — ABNORMAL HIGH (ref 24–36)

## 2020-09-20 NOTE — Patient Instructions (Signed)
Your procedure is scheduled on: Mon 11/15 Report to Registration Desk then to 2nd floor surgery desk. To find out your arrival time please call [phone] between 1PM - 3PM on Friday 11/12.  Remember: Instructions that are not followed completely may result in serious medical risk,  up to and including death, or upon the discretion of your surgeon and anesthesiologist your  surgery may need to be rescheduled.     _X__ 1. Do not eat food after midnight the night before your procedure.                 No chewing gum or hard candies. You may drink clear liquids up to 2 hours                 before you are scheduled to arrive for your surgery- DO not drink clear                 liquids within 2 hours of the start of your surgery.                 Clear Liquids include:  water, apple juice without pulp, clear Gatorade, G2 or                  Gatorade Zero (avoid Red/Purple/Blue), Black Coffee or Tea (Do not add                 anything to coffee or tea). __x___2.   Complete the "Ensure Clear Pre-surgery Clear Carbohydrate Drink" provided to you, 2 hours before arrival.  __X__2.  On the morning of surgery brush your teeth with toothpaste and water, you                may rinse your mouth with mouthwash if you wish.  Do not swallow any toothpaste of mouthwash.     ___ 3.  No Alcohol for 24 hours before or after surgery.   __ 4.  Do Not Smoke or use e-cigarettes For 24 Hours Prior to Your Surgery.                 Do not use any chewable tobacco products for at least 6 hours prior to                 Surgery.  ___  5.  Do not use any recreational drugs (marijuana, cocaine, heroin, ecstasy, MDMA or other)                For at least one week prior to your surgery.  Combination of these drugs with anesthesia                May have life threatening results.  ____  6.  Bring all medications with you on the day of surgery if instructed.   _x___  7.  Notify your doctor if  there is any change in your medical condition      (cold, fever, infections).     Do not wear jewelry, Do not wear lotions,  You may wear deodorant. Do not shave 48 hours prior to surgery. Men may shave face and neck. Do not bring valuables to the hospital.    [facility] is not responsible for any belongings or valuables.  Contacts, dentures or bridgework may not be worn into surgery. Leave your suitcase in the car. After surgery it may be brought to your room. For patients admitted to the hospital, discharge time is determined by  your treatment team.   Patients discharged the day of surgery will not be allowed to drive home.   Make arrangements for someone to be with you for the first 24 hours of your Same Day Discharge.    Please read over the following fact sheets that you were given:   Incentive spirometer  _x___ Take these medicines the morning of surgery with A SIP OF WATER:    1. tamsulosin (FLOMAX) 0.4 MG CAPS capsule  2.   3.   4.  5.  6.  ____ Fleet Enema (as directed)   _x___ Use CHG Soap (or wipes) as directed  ____ Use Benzoyl Peroxide Gel as instructed  ____ Use inhalers on the day of surgery  ____ Stop metformin 2 days prior to surgery    ____ Take 1/2 of usual insulin dose the night before surgery. No insulin the morning          of surgery.   __x__ Stop Coumadin last dose on 11/9   ____ Stop Anti-inflammatories on    __x__ Stop supplements until after surgery.  Glucosamine-Chondroitin (OSTEO BI-FLEX REGULAR STRENGTH PO)  ____ Bring C-Pap to the hospital.    If you have any questions regarding your pre-procedure instructions,  Please call Pre-admit Testing at [place]

## 2020-09-20 NOTE — Progress Notes (Signed)
  [facility] Perioperative Services: Pre-Admission/Anesthesia Testing     Date: 09/20/20  Name: [name] [name] MRN:   [id number]  Re:  ECG changes noted during PAT appointment  Patient presents to PAT today for pre-anesthesia testing prior to undergoing an elective THR on 09/30/2020 with Dr. Skip Estimable.  As a part of his presurgical work-up, patient had a twelve-lead ECG that demonstrated inferolateral ST depression and T wave inversions.  ECG compared to last tracing on file from 10/13/2012, and these findings are noted to be new since that time.  Patient does not see cardiology.  Additionally, the PAT nursing interview patient noted the fact that patient is also undergoing IVC filter placement on Monday (09/23/2020) in preparation for his hip surgery due to a PMH (+) for DVT.     Spoke with Dr. Lucky Cowboy (vascular surgery) and reviewed ECG.  Per MD, ECG changes should not have any implications on the planned IVC filter placement. MD notes that IVC filter placement is only slightly more invasive than placing an IV.    Discussed with attending anesthesia on call Kayleen Memos, MD) who agrees that patient would need to have cardiac clearance prior to proceeding with hip arthroplasty based on changed observed on today's ECG. Ok to proceed with IVC placement.    I have already taken the liberty of sending over a referral/appointment request to cardiology, however they are unfortunately closed already today. Anticipate that this will be addressed on Monday.    Call placed to patient to make him aware of the ECG changes and need to be seen by cardiology. Questions fielded. In efforts to avoid delays in his planned hip surgery, patient was advised to have his wife contact [facility] cardiology on Monday after his vascular procedure, if they have not been contacted, to inquire about appointment.    Will send copy of note to Dr. Marry Guan to make him aware of ECG findings and ordered  pre-surgical cardiac clearance.   Honor Loh, MSN, APRN, FNP-C, CEN [facility]  Peri-operative Services Nurse Practitioner Phone: [phone] 09/20/20 5:08 PM

## 2020-09-22 LAB — URINE CULTURE
Culture: NO GROWTH
Special Requests: NORMAL

## 2020-09-23 ENCOUNTER — Other Ambulatory Visit: Payer: Self-pay

## 2020-09-23 ENCOUNTER — Other Ambulatory Visit: Payer: Self-pay | Admitting: Cardiology

## 2020-09-23 ENCOUNTER — Encounter: Payer: Self-pay | Admitting: Vascular Surgery

## 2020-09-23 ENCOUNTER — Encounter: Payer: Self-pay | Admitting: Urgent Care

## 2020-09-23 ENCOUNTER — Other Ambulatory Visit (INDEPENDENT_AMBULATORY_CARE_PROVIDER_SITE_OTHER): Payer: Self-pay | Admitting: Nurse Practitioner

## 2020-09-23 ENCOUNTER — Ambulatory Visit
Admission: RE | Admit: 2020-09-23 | Discharge: 2020-09-23 | Disposition: A | Discharge status: Home / Self Care | Payer: Medicare HMO | Attending: Vascular Surgery | Admitting: Vascular Surgery

## 2020-09-23 ENCOUNTER — Other Ambulatory Visit: Payer: Self-pay | Admitting: Psychiatry

## 2020-09-23 ENCOUNTER — Encounter
Admission: RE | Disposition: A | Discharge status: Home / Self Care | Payer: Self-pay | Source: Home / Self Care | Attending: Vascular Surgery

## 2020-09-23 DIAGNOSIS — I82409 Acute embolism and thrombosis of unspecified deep veins of unspecified lower extremity: Secondary | ICD-10-CM | POA: Diagnosis not present

## 2020-09-23 DIAGNOSIS — Z885 Allergy status to narcotic agent status: Secondary | ICD-10-CM | POA: Insufficient documentation

## 2020-09-23 DIAGNOSIS — E785 Hyperlipidemia, unspecified: Secondary | ICD-10-CM | POA: Insufficient documentation

## 2020-09-23 DIAGNOSIS — Z86718 Personal history of other venous thrombosis and embolism: Secondary | ICD-10-CM | POA: Diagnosis not present

## 2020-09-23 DIAGNOSIS — Z882 Allergy status to sulfonamides status: Secondary | ICD-10-CM | POA: Insufficient documentation

## 2020-09-23 DIAGNOSIS — Z95828 Presence of other vascular implants and grafts: Secondary | ICD-10-CM

## 2020-09-23 DIAGNOSIS — M8788 Other osteonecrosis, other site: Secondary | ICD-10-CM | POA: Diagnosis not present

## 2020-09-23 DIAGNOSIS — R9431 Abnormal electrocardiogram [ECG] [EKG]: Secondary | ICD-10-CM

## 2020-09-23 DIAGNOSIS — I10 Essential (primary) hypertension: Secondary | ICD-10-CM

## 2020-09-23 DIAGNOSIS — Z01810 Encounter for preprocedural cardiovascular examination: Secondary | ICD-10-CM

## 2020-09-23 HISTORY — DX: Presence of other vascular implants and grafts: Z95.828

## 2020-09-23 HISTORY — PX: IVC FILTER INSERTION: CATH118245

## 2020-09-23 SURGERY — IVC FILTER INSERTION
Anesthesia: Moderate Sedation

## 2020-09-23 MED ORDER — HEPARIN SODIUM (PORCINE) 1000 UNIT/ML IJ SOLN
INTRAMUSCULAR | Status: AC
  Filled 2020-09-23: qty 1

## 2020-09-23 MED ORDER — MIDAZOLAM HCL 2 MG/ML PO SYRP: 8.0000 mg | ORAL_SOLUTION | Freq: Once | ORAL | Status: DC | PRN

## 2020-09-23 MED ORDER — FAMOTIDINE 20 MG PO TABS: 40.0000 mg | ORAL_TABLET | Freq: Once | ORAL | Status: DC | PRN

## 2020-09-23 MED ORDER — CEFAZOLIN SODIUM-DEXTROSE 2-4 GM/100ML-% IV SOLN
INTRAVENOUS | Status: AC
  Filled 2020-09-23: qty 100

## 2020-09-23 MED ORDER — DIPHENHYDRAMINE HCL 50 MG/ML IJ SOLN: 50.0000 mg | Freq: Once | INTRAMUSCULAR | Status: DC | PRN

## 2020-09-23 MED ORDER — CEFAZOLIN SODIUM-DEXTROSE 2-4 GM/100ML-% IV SOLN
2.0000 g | Freq: Once | INTRAVENOUS | Status: AC
  Administered 2020-09-23: 2 g via INTRAVENOUS

## 2020-09-23 MED ORDER — FENTANYL CITRATE (PF) 100 MCG/2ML IJ SOLN
INTRAMUSCULAR | Status: AC
  Filled 2020-09-23: qty 2

## 2020-09-23 MED ORDER — FENTANYL CITRATE (PF) 100 MCG/2ML IJ SOLN
INTRAMUSCULAR | Status: DC | PRN
  Administered 2020-09-23: 50 ug via INTRAVENOUS

## 2020-09-23 MED ORDER — SODIUM CHLORIDE 0.9 % IV SOLN: INTRAVENOUS | Status: DC

## 2020-09-23 MED ORDER — MIDAZOLAM HCL 5 MG/5ML IJ SOLN
INTRAMUSCULAR | Status: AC
  Filled 2020-09-23: qty 5

## 2020-09-23 MED ORDER — METHYLPREDNISOLONE SODIUM SUCC 125 MG IJ SOLR: 125.0000 mg | Freq: Once | INTRAMUSCULAR | Status: DC | PRN

## 2020-09-23 MED ORDER — IODIXANOL 320 MG/ML IV SOLN
INTRAVENOUS | Status: DC | PRN
  Administered 2020-09-23 (×2): 20 mL

## 2020-09-23 MED ORDER — MIDAZOLAM HCL 2 MG/2ML IJ SOLN
INTRAMUSCULAR | Status: DC | PRN
  Administered 2020-09-23: 2 mg via INTRAVENOUS

## 2020-09-23 SURGICAL SUPPLY — 4 items
CANNULA 5F STIFF (CANNULA) ×3 IMPLANT
KIT FEMORAL DEL DENALI (Miscellaneous) ×3 IMPLANT
PACK ANGIOGRAPHY (CUSTOM PROCEDURE TRAY) ×3 IMPLANT
WIRE J 3MM .035X145CM (WIRE) ×3 IMPLANT

## 2020-09-23 NOTE — Op Note (Signed)
[facility]   OPERATIVE NOTE    PRE-OPERATIVE DIAGNOSIS: history of DVT, upcoming knee replacement  POST-OPERATIVE DIAGNOSIS: same as above  PROCEDURE: 1.   Ultrasound guidance for vascular access to the right femoral vein 2.   Catheter placement into the inferior vena cava 3.   Inferior venacavogram and right ileofemoral venogram 4.   Placement of a Bard Denali IVC filter  SURGEON: Leotis Pain, MD  ASSISTANT(S): None  ANESTHESIA: local with Moderate Conscious Sedation for approximately 13 minutes using 2 mg of Versed and 50 mcg of Fentanyl  ESTIMATED BLOOD LOSS: minimal  CONTRAST: 20 cc  FLUORO TIME: less than one minute  FINDING(S): 1.  Patent IVC  SPECIMEN(S):  none  INDICATIONS:   [name] [name] is a 74 y.o. male who presents with a history of DVT and upcoming knee replacement.  Inferior vena cava filter is indicated for this reason.  Risks and benefits including filter thrombosis, migration, fracture, bleeding, and infection were all discussed.  We discussed that all IVC filters that we place can be removed if desired from the patient once the need for the filter has passed.    DESCRIPTION: After obtaining full informed written consent, the patient was brought back to the vascular suite. The skin was sterilely prepped and draped in a sterile surgical field was created. Moderate conscious sedation was administered during a face to face encounter with the patient throughout the procedure with my supervision of the RN administering medicines and monitoring the patient's vital signs, pulse oximetry, telemetry and mental status throughout from the start of the procedure until the patient was taken to the recovery room. The right femoral vein was accessed under direct ultrasound guidance without difficulty with a micropuncture needle and a micropuncture wire and sheath were placed.  Imaging showed the femoral vein and iliac veins to be patent and a J-wire was  then placed. After skin nick and dilatation, the delivery sheath was placed into the inferior vena cava and an inferior venacavogram was performed. This demonstrated a patent IVC with the level of the renal veins at L1.  The filter was then deployed into the inferior vena cava at the level of the L1-L2 interspace just below the renal veins. The delivery sheath was then removed. Pressure was held. Sterile dressings were placed. The patient tolerated the procedure well and was taken to the recovery room in stable condition.  COMPLICATIONS: None  CONDITION: Stable  Leotis Pain  09/23/2020, 9:01 AM   This note was created with Dragon Medical transcription system. Any errors in dictation are purely unintentional.

## 2020-09-23 NOTE — H&P (Signed)
[facility] Admission History & Physical  MRN : [id number]  [name] [name] is a 74 y.o. (1946/05/16) male who presents with chief complaint of scheduled IVC filter placement  History of Present Illness:  [name] [name] is a 74 year old male evaluation of placement of an IVC filter preliminary to an upcoming hip replacement surgery. Patient has fairly significant stasis dermatitis and residual varicosities particularly in the left lower extremity after a previous DVT.  Stasis dermatitis changes are significant on the right as well.  He has had significant arthritic hip issues and was actually scheduled to have a hip replacement last September but COVID-19 delayed that.  He has no complaints today other than his arthritis and chronic leg swelling and discoloration.  No recent chest pain or shortness of breath.  No fever or chills.   Current Facility-Administered Medications  Medication Dose Route Frequency Provider Last Rate Last Admin  . 0.9 %  sodium chloride infusion   Intravenous Continuous Kris Hartmann, NP 75 mL/hr at 09/23/20 0740 New Bag at 09/23/20 0740  . ceFAZolin (ANCEF) 2-4 GM/100ML-% IVPB           . diphenhydrAMINE (BENADRYL) injection 50 mg  50 mg Intravenous Once PRN Kris Hartmann, NP      . famotidine (PEPCID) tablet 40 mg  40 mg Oral Once PRN Kris Hartmann, NP      . fentaNYL (SUBLIMAZE) 100 MCG/2ML injection           . methylPREDNISolone sodium succinate (SOLU-MEDROL) 125 mg/2 mL injection 125 mg  125 mg Intravenous Once PRN Eulogio Ditch E, NP      . midazolam (VERSED) 2 MG/ML syrup 8 mg  8 mg Oral Once PRN Kris Hartmann, NP      . midazolam (VERSED) 5 MG/5ML injection            Past Medical History:  Diagnosis Date  . Arthritis   . DVT (deep venous thrombosis) ([place])   . GERD (gastroesophageal reflux disease)   . Hypertension   . Prostate cancer ([place]) 2018   Treated with radiation  . Rectal bleeding    2013,2020   Past Surgical History:   Procedure Laterality Date  . COLONOSCOPY N/A 07/02/2019   Procedure: COLONOSCOPY;  Surgeon: Lin Landsman, MD;  Location: [facility]COPY;  Service: Gastroenterology;  Laterality: N/A;  . ESOPHAGOGASTRODUODENOSCOPY N/A 07/02/2019   Procedure: ESOPHAGOGASTRODUODENOSCOPY (EGD);  Surgeon: Lin Landsman, MD;  Location: [facility] ENDOSCOPY;  Service: Gastroenterology;  Laterality: N/A;  . FOOT FRACTURE SURGERY Right 2012   Heel fracture repair/ has metal, fell off ladder  . HERNIA REPAIR  5/52005   Social History Social History   Tobacco Use  . Smoking status: Never Smoker  . Smokeless tobacco: Never Used  Vaping Use  . Vaping Use: Never used  Substance Use Topics  . Alcohol use: Not Currently  . Drug use: Not Currently   Family History Family History  Problem Relation Age of Onset  . Heart attack Father   Denies family history of peripheral artery disease, venous disease or renal disease.  Allergies  Allergen Reactions  . Codeine Nausea Only  . Sulfa Antibiotics Nausea And Vomiting   REVIEW OF SYSTEMS (Negative unless checked)  Constitutional: [] Weight loss  [] Fever  [] Chills Cardiac: [] Chest pain   [] Chest pressure   [] Palpitations   [] Shortness of breath when laying flat   [] Shortness of breath at rest   [] Shortness of breath with  exertion. Vascular:  [] Pain in legs with walking   [] Pain in legs at rest   [] Pain in legs when laying flat   [] Claudication   [] Pain in feet when walking  [] Pain in feet at rest  [] Pain in feet when laying flat   [x] History of DVT   [x] Phlebitis   [x] Swelling in legs   [] Varicose veins   [] Non-healing ulcers Pulmonary:   [] Uses home oxygen   [] Productive cough   [] Hemoptysis   [] Wheeze  [] COPD   [] Asthma Neurologic:  [] Dizziness  [] Blackouts   [] Seizures   [] History of stroke   [] History of TIA  [] Aphasia   [] Temporary blindness   [] Dysphagia   [] Weakness or numbness in arms   [] Weakness or numbness in legs Musculoskeletal:  [] Arthritis   [] Joint  swelling   [] Joint pain   [] Low back pain Hematologic:  [] Easy bruising  [] Easy bleeding   [] Hypercoagulable state   [] Anemic  [] Hepatitis Gastrointestinal:  [] Blood in stool   [] Vomiting blood  [] Gastroesophageal reflux/heartburn   [] Difficulty swallowing. Genitourinary:  [] Chronic kidney disease   [] Difficult urination  [] Frequent urination  [] Burning with urination   [] Blood in urine Skin:  [] Rashes   [] Ulcers   [] Wounds Psychological:  [] History of anxiety   []  History of major depression.  Physical Examination  Vitals:   09/23/20 0910 09/23/20 0915 09/23/20 0930 09/23/20 0945  BP: (!) 146/63 (!) 140/59 138/61 (!) 143/62  Pulse: 72 66 62 61  Resp: 12 16 16 20   Temp:      TempSrc:      SpO2: 98% 99% 97% 99%  Weight:      Height:       Body mass index is 30.24 kg/m. Gen: WD/WN, NAD Head: [place]/AT, No temporalis wasting.  Ear/Nose/Throat: Hearing grossly intact, nares w/o erythema or drainage, oropharynx w/o Erythema/Exudate, Eyes: Sclera non-icteric, conjunctiva clear Neck: Supple, no nuchal rigidity.  No JVD.  Pulmonary:  Good air movement, no increased work of respiration or use of accessory muscles  Cardiac: RRR, normal S1, S2, no Murmurs, rubs or gallops. Vascular:  Vessel Right Left  Radial Palpable Palpable  Ulnar Palpable Palpable  Brachial Palpable Palpable  Carotid Palpable, without bruit Palpable, without bruit  Aorta Not palpable N/A  Femoral Palpable Palpable  Popliteal Palpable Palpable  PT Palpable Palpable  DP Palpable Palpable   Gastrointestinal: soft, non-tender/non-distended. No guarding/reflex. No masses, surgical incisions, or scars. Musculoskeletal: M/S 5/5 throughout.  No deformity or atrophy.  Mild edema Neurologic: Sensation grossly intact in extremities.  Symmetrical.  Speech is fluent. Motor exam as listed above. Psychiatric: Judgment intact, Mood & affect appropriate for pt's clinical situation. Dermatologic: No rashes or ulcers noted.  No  cellulitis or open wounds. Lymph : No Cervical, Axillary, or Inguinal lymphadenopathy.  CBC Lab Results  Component Value Date   WBC 5.0 09/20/2020   HGB 14.2 09/20/2020   HCT 42.1 09/20/2020   MCV 91.1 09/20/2020   PLT 228 09/20/2020   BMET    Component Value Date/Time   NA 140 09/20/2020 1511   NA 144 10/15/2012 0418   K 3.7 09/20/2020 1511   K 4.3 10/15/2012 0418   CL 106 09/20/2020 1511   CL 112 (H) 10/15/2012 0418   CO2 27 09/20/2020 1511   CO2 25 10/15/2012 0418   GLUCOSE 100 (H) 09/20/2020 1511   GLUCOSE 91 10/15/2012 0418   BUN 17 09/20/2020 1511   BUN 13 10/15/2012 0418   CREATININE 1.04 09/20/2020 1511   CREATININE  0.97 10/15/2012 0418   CALCIUM 9.2 09/20/2020 1511   CALCIUM 8.5 10/15/2012 0418   GFRNONAA >60 09/20/2020 1511   GFRNONAA >60 10/15/2012 0418   GFRAA >60 07/02/2019 0554   GFRAA >60 10/15/2012 0418   Estimated Creatinine Clearance: 71.2 mL/min (by C-G formula based on SCr of 1.04 mg/dL).  COAG Lab Results  Component Value Date   INR 2.6 (H) 09/20/2020   INR 1.5 (H) 07/02/2019   INR 1.8 (H) 07/01/2019   Radiology PERIPHERAL VASCULAR CATHETERIZATION  Result Date: 09/23/2020 See op note  Assessment/Plan [name] [name] is a 74 year old male evaluation of placement of an IVC filter preliminary to an upcoming hip replacement surgery.   1.  Patient presents for scheduled IVC filter placement due to upcoming surgery  Patient has had DVT in the past and therefore is at high risk.  Procedure, risks and benefits were explained to the patient.  All questions were answered.  The patient wished to proceed.  2.  Avascular necrosis of right hip - patient is scheduled to undergo right hip arthroplasty in the very near future   3.  Hyperlipidemia:  Encouraged good control as its slows the progression of atherosclerotic disease  Discussed with Dr. Mayme Genta, PA-C  09/23/2020 10:11 AM

## 2020-09-23 NOTE — Progress Notes (Signed)
Noted pt. INR to be 2.6 on [facility] 11/4. Pt. Stopped coumadin on 11/4. Contacted Dr. Lucky Cowboy: he stated "I'm ok with not rechecking the INR for today's procedure."

## 2020-09-24 ENCOUNTER — Encounter: Payer: Self-pay | Admitting: Orthopedic Surgery

## 2020-09-26 ENCOUNTER — Other Ambulatory Visit: Payer: Medicare HMO

## 2020-09-26 ENCOUNTER — Ambulatory Visit: Payer: Medicare HMO

## 2020-10-03 ENCOUNTER — Ambulatory Visit
Admission: RE | Admit: 2020-10-03 | Discharge: 2020-10-03 | Disposition: A | Discharge status: Home / Self Care | Payer: Medicare HMO | Source: Ambulatory Visit | Attending: Cardiology | Admitting: Cardiology

## 2020-10-03 ENCOUNTER — Other Ambulatory Visit: Payer: Self-pay

## 2020-10-03 DIAGNOSIS — I10 Essential (primary) hypertension: Secondary | ICD-10-CM | POA: Diagnosis present

## 2020-10-03 DIAGNOSIS — Z01810 Encounter for preprocedural cardiovascular examination: Secondary | ICD-10-CM | POA: Insufficient documentation

## 2020-10-03 DIAGNOSIS — R9431 Abnormal electrocardiogram [ECG] [EKG]: Secondary | ICD-10-CM | POA: Diagnosis not present

## 2020-10-03 MED ORDER — REGADENOSON 0.4 MG/5ML IV SOLN
0.4000 mg | Freq: Once | INTRAVENOUS | Status: AC
  Administered 2020-10-03: 0.4 mg via INTRAVENOUS

## 2020-10-03 MED ORDER — TECHNETIUM TC 99M TETROFOSMIN IV KIT
10.0000 | PACK | Freq: Once | INTRAVENOUS | Status: AC | PRN
  Administered 2020-10-03: 10.78 via INTRAVENOUS

## 2020-10-03 MED ORDER — TECHNETIUM TC 99M TETROFOSMIN IV KIT
31.7300 | PACK | Freq: Once | INTRAVENOUS | Status: AC | PRN
  Administered 2020-10-03: 31.73 via INTRAVENOUS

## 2020-10-04 LAB — NM MYOCAR MULTI W/SPECT W/WALL MOTION / EF
Estimated workload: 1 METS
Exercise duration (sec): 57 s
LV dias vol: 113 mL (ref 62–150)
LV sys vol: 36 mL
Peak HR: 101 {beats}/min
Percent HR: 69 %
Rest HR: 67 {beats}/min
SDS: 0
SRS: 12
SSS: 1
TID: 0.95

## 2020-10-09 NOTE — Progress Notes (Signed)
[facility] Perioperative Services  Pre-Admission/Anesthesia Testing Clinical Review  Date: 10/09/20  Patient Demographics:  Name: [name] [name] DOB:   [birth year] MRN:   [id number]  Planned Surgical Procedure(s):    Case: [id number] Date/Time: 10/21/20 0700   Procedure: TOTAL HIP ARTHROPLASTY (Right Hip)   Anesthesia type: Choice   Pre-op diagnosis:      Avascular necrosis of right femur M87.051     Primary osteoarthritis of right hip M16.11   Location: [facility] OR ROOM 01 / [place] ORS FOR ANESTHESIA GROUP   Surgeons: Dereck Leep, MD     NOTE: Available PAT nursing documentation and vital signs have been reviewed. Clinical nursing staff has updated patient's PMH/PSHx, current medication list, and drug allergies/intolerances to ensure comprehensive history available to assist in medical decision making as it pertains to the aforementioned surgical procedure and anticipated anesthetic course.   Clinical Discussion:  [name] [name] is a 74 y.o. male who is submitted for pre-surgical anesthesia review and clearance prior to him undergoing the above procedure. Patient has never been a smoker. Pertinent PMH includes: HTN, HLD, DVT (on warfarin), anemia, GERD, prostate cancer (s/p XRT in 2018), OA, AVN (right hip).  Patient scheduled to undergo total hip arthroplasty on 09/20/2020 for treatment of AVN of the RIGHT femur and primary osteoarthritis.  Patient presented to PAT clinic on 09/20/2020 for preadmission/anesthesia testing.  During the course of his testing, patient had a twelve-lead ECG performed that showed a nonspecific IVCD with associated anterolateral ST and T wave abnormalities.  ECG was compared to last tracing on file from 10/13/2012, and these changes are new.  Patient advising that he has no previous cardiac history, and therefore has never seen a cardiologist; pertinent medical history as above.  Given the acute changes on his ECG, patient referral sent to [facility] cardiology for further evaluation and preoperative cardiac clearance.   Patient underwent IVC filter placement on 09/23/2020.  In review of the procedural notes, device was placed with no complications and patient was discharged home in stable condition. [name] [name] was also seen and initial consult by cardiology Saralyn Pilar, MD) on 09/23/2020; notes reviewed.  Patient denied angina or anginal equivalent symptoms.  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  Patient chronically anticoagulated with warfarin, and again, now has an IVC filter in place.  Patient is active, however does not exercise regularly.  Patient denies cardiac history.  Hypertension adequately  controlled on diuretic therapy (triamterene-HCTZ).  Both HTN and HLD are controlled with lifestyle/nutritional modifications.  Copy of ECG from PAT showing a nonspecific IVCD with associated ST-T wave changes reviewed by cardiologist; agreed with PAT interpretation.    Lexiscan was performed on 10/03/2020 revealing normal left ventricular systolic function with an LVEF of 55-65%.  There was no evidence of stress-induced myocardial ischemia or arrhythmia; low risk study (see full interpretation of cardiovascular testing below).  Patient was seen in follow-up consult on 10/09/2020 by Dr. Saralyn Pilar.  Patient continues to deny angina or anginal equivalent symptoms.  No issues following IVC filter placement on 09/23/2020.  Patient denies acute changes to medical history or medication regimen since last visit. Per cardiology, "the patient should be at LOW risk for serious cardiovascular complication during right total hip replacement surgery.  The patient has essential hypertension, however blood pressure is adequately controlled on current blood pressure medications".  Patient scheduled to follow-up with cardiology on an as-needed basis.  This patient is on daily anticoagulation therapy using  warfarin.  He has been instructed on  recommendations to hold his anticoagulation for 5 days prior to his procedure.  Patient verbalizes understanding that his last dose of warfarin will be on 10/16/2020.  He denies previous perioperative complications with anesthesia. He underwent a general anesthetic course here (ASA II) in 06/2019 with no documented complications.   Vitals with BMI 09/23/2020 09/23/2020 09/23/2020  Height - - -  Weight - - -  BMI - - -  Systolic 809 983 382  Diastolic 66 72 62  Pulse 61 61 61    Providers/Specialists:   NOTE: Primary physician provider listed below. Patient may have been seen by APP or partner within same practice.   PROVIDER ROLE LAST OV  Hooten, Laurice Record, MD Orthopedic (Surgeon)  09/24/2020  Adin Hector, MD Primary Care Provider  04/04/2020  Isaias Cowman, MD Cardiology  10/09/2020   Allergies:  Codeine and Sulfa antibiotics  Current Home Medications:   No current facility-administered medications for this encounter.   . Cyanocobalamin 2500 MCG SUBL  . Glucosamine-Chondroitin (OSTEO BI-FLEX REGULAR STRENGTH PO)  . tamsulosin (FLOMAX) 0.4 MG CAPS capsule  . triamterene-hydrochlorothiazide (MAXZIDE-25) 37.5-25 MG tablet  . warfarin (COUMADIN) 4 MG tablet  . Multiple Vitamin (MULTIVITAMIN PO)   History:   Past Medical History:  Diagnosis Date  . Arthritis   . AVN of femur ([place])    RIGHT  . DVT (deep venous thrombosis) ([place])   . GERD (gastroesophageal reflux disease)   . Hyperlipemia   . Hypertension   . 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 Prostate cancer ([place]) 2018   Treated with radiation  . Rectal bleeding    2013, 2020   Past Surgical History:  Procedure Laterality Date  . COLONOSCOPY N/A 07/02/2019   Procedure: COLONOSCOPY;  Surgeon: Lin Landsman, MD;  Location: [facility] ENDOSCOPY;  Service: Gastroenterology;  Laterality: N/A;  . ESOPHAGOGASTRODUODENOSCOPY N/A 07/02/2019   Procedure: ESOPHAGOGASTRODUODENOSCOPY (EGD);  Surgeon: Lin Landsman, MD;  Location: [facility] ENDOSCOPY;  Service: Gastroenterology;  Laterality: N/A;  . FOOT FRACTURE SURGERY Right 2012   Heel fracture repair/ has metal, fell off ladder  . HERNIA REPAIR  5/52005  . IVC FILTER INSERTION N/A 09/23/2020   Procedure: IVC FILTER INSERTION;  Surgeon: Algernon Huxley, MD;  Location: [place] CV LAB;  Service: Cardiovascular;  Laterality: N/A;   Family History  Problem Relation Age of Onset  . Heart attack Father    Social History   Tobacco Use  . Smoking status: Never Smoker  . Smokeless tobacco: Never Used  Vaping Use  . Vaping Use: Never used  Substance Use Topics  . Alcohol use: Not Currently  . Drug use: Not Currently    Pertinent Clinical Results:  LABS: Labs reviewed: Acceptable for surgery.         ECG: Date: 09/20/2020 Time ECG obtained: 1445 PM Rate: 65 bpm Rhythm:  NSR; nonspecific IVCD Intervals: PR 176 ms. QRS 136 ms. QTc 443 ms. ST segment and T wave changes: Inferolateral ST-T wave abnormalities Comparison: Acute changes noted from last tracing performed on 10/13/2012   IMAGING / PROCEDURES: LEXISCAN done on 10/03/2020 1. The left ventricular ejection fraction is normal (55-65%). 2. Blood pressure demonstrated a normal response to exercise.  Resting vital signs: Blood pressure 156/70 with heart rate 67 bpm  Stress vital signs:   Blood pressure 164/64 with heart rate 101 bpm 3. There was no evidence of stress-induced myocardial ischemia or arrhythmia 4. The  study is normal. 5. This is a low risk study.  Impression and Plan:  [name] [name] has been referred for pre-anesthesia review and clearance prior to him undergoing the planned anesthetic and procedural courses. Available labs, pertinent testing, and imaging results were personally reviewed by me. This patient has been appropriately cleared by cardiology with a below risk for cardiovascular complications perioperatively.   Based on clinical review performed today  (10/09/20), barring any significant acute changes in the patient's overall condition, it is anticipated that he will be able to proceed with the planned surgical intervention. Any acute changes in clinical condition may necessitate his procedure being postponed and/or cancelled. Pre-surgical instructions were reviewed with the patient during his PAT appointment and questions were fielded by PAT clinical staff.  Honor Loh, MSN, APRN, FNP-C, CEN [facility]  Peri-operative Services Nurse Practitioner Phone: [phone] 10/09/20 12:12 PM  NOTE: This note has been prepared using Dragon dictation software. Despite my best ability to proofread, there is always the potential that unintentional transcriptional errors may still occur from this process.

## 2020-10-17 ENCOUNTER — Other Ambulatory Visit
Admission: RE | Admit: 2020-10-17 | Discharge: 2020-10-17 | Disposition: A | Discharge status: Home / Self Care | Payer: Medicare HMO | Source: Ambulatory Visit | Attending: Orthopedic Surgery | Admitting: Orthopedic Surgery

## 2020-10-17 ENCOUNTER — Other Ambulatory Visit: Payer: Self-pay

## 2020-10-17 DIAGNOSIS — Z01812 Encounter for preprocedural laboratory examination: Secondary | ICD-10-CM | POA: Diagnosis present

## 2020-10-17 DIAGNOSIS — Z20822 Contact with and (suspected) exposure to covid-19: Secondary | ICD-10-CM | POA: Diagnosis not present

## 2020-10-17 LAB — SARS CORONAVIRUS 2 (TAT 6-24 HRS): SARS Coronavirus 2: NEGATIVE

## 2020-10-19 LAB — TYPE AND SCREEN
ABO/RH(D): O POS
Antibody Screen: NEGATIVE

## 2020-10-20 ENCOUNTER — Encounter: Payer: Self-pay | Admitting: Orthopedic Surgery

## 2020-10-20 MED ORDER — CEFAZOLIN SODIUM-DEXTROSE 2-4 GM/100ML-% IV SOLN
2.0000 g | INTRAVENOUS | Status: AC
  Administered 2020-10-21: 2 g via INTRAVENOUS

## 2020-10-20 MED ORDER — GABAPENTIN 300 MG PO CAPS: 300.0000 mg | ORAL_CAPSULE | Freq: Once | ORAL | Status: AC

## 2020-10-20 MED ORDER — CHLORHEXIDINE GLUCONATE 4 % EX LIQD: 60.0000 mL | Freq: Once | CUTANEOUS | Status: DC

## 2020-10-20 MED ORDER — TRANEXAMIC ACID-NACL 1000-0.7 MG/100ML-% IV SOLN
1000.0000 mg | INTRAVENOUS | Status: AC
  Administered 2020-10-21: 1000 mg via INTRAVENOUS

## 2020-10-20 MED ORDER — ORAL CARE MOUTH RINSE: 15.0000 mL | Freq: Once | OROMUCOSAL | Status: DC

## 2020-10-20 MED ORDER — DEXAMETHASONE SODIUM PHOSPHATE 10 MG/ML IJ SOLN: 8.0000 mg | Freq: Once | INTRAMUSCULAR | Status: AC

## 2020-10-20 MED ORDER — CHLORHEXIDINE GLUCONATE 0.12 % MT SOLN: 15.0000 mL | Freq: Once | OROMUCOSAL | Status: DC

## 2020-10-20 MED ORDER — FAMOTIDINE 20 MG PO TABS: 20.0000 mg | ORAL_TABLET | Freq: Once | ORAL | Status: DC

## 2020-10-20 MED ORDER — CELECOXIB 200 MG PO CAPS: 400.0000 mg | ORAL_CAPSULE | Freq: Once | ORAL | Status: DC

## 2020-10-20 MED ORDER — LACTATED RINGERS IV SOLN: INTRAVENOUS | Status: DC

## 2020-10-20 NOTE — H&P (Signed)
ORTHOPAEDIC HISTORY & PHYSICAL Progress Notes Gwenlyn Fudge, [place] - 09/24/2020 11:30 AM EST [facility] Chief Complaint:   Chief Complaint  Patient presents with  . Hip Pain  H & P RIGHT HIP   History of Present Illness:   [name] [name] is a 74 y.o. male that presents to clinic today for his preoperative history and evaluation. Patient presents with his wife. The patient is scheduled to undergo a right total hip arthroplasty on 09/30/20 by Dr. Marry Guan. His pain began several years ago. He was diagnosed with prostate cancer in 2018 and underwent external beam radiation. 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 The pain is located in the right hip and groin. He describes his pain as worse with weightbearing. He denies associated numbness or tingling.   The patient's symptoms have progressed to the point that they decrease his quality of life. The patient has previously undergone conservative treatment including use of glucosamine/chondroitin, use of ambulatory aids, and activity modification without adequate control of his symptoms.  Of note, patient does have a history of DVT. He underwent placement of an IVC filter yesterday in preparation for surgery. Patient was also seen yesterday for his cardiac clearance at which time it was determined that he would need to undergo further testing. Patient is scheduled for a stress test on Thursday 09/26/20.   Patient is on chronic anticoagulation with warfarin, which he has stopped and will resume post-operatively.   Past Medical, Surgical, Family, Social History, Allergies, Medications:   Past Medical History:  Past Medical History:  Diagnosis Date  . Anemia  SPEP, UPEP normal, 5/14  . Basal cell carcinoma  face  . Benign neoplasm of colon  . Bilateral foot pain onset 2006  Counsel with Dr. Elvina Mattes May 2006.  Marland Kitchen Cancer of prostate  (CMS-HCC) 11/26/2016  Intermediate risk gleason 7 by biopsy 11/17/16; followed by [facility]  . DDD (degenerative disc disease)  . Discomfort of right hip  Recurrent. LS spine and right hip films performed August 2008. Improved with antiinflammatories.  Marland Kitchen DVT (deep venous thrombosis) (CMS-HCC)  05/05 following hernia surgery. Left leg saphenous thrombosis 12/09, Coumadin restarted with plans for permanent treatment.  Marland Kitchen GERD (gastroesophageal reflux disease)  . Heart murmur, unspecified  . Hematochezia  Admitted 11/13, thought to be due to diverticulosis  . History of basal cell carcinoma  ollowed by Dr. Evorn Gong. Status post resection of basal cell January 2008.  Marland Kitchen History of prostatitis  with elevated PSA, subsequently normalized, followed by Dr. Jacqlyn Larsen  . Hyperlipidemia  . Onychia and paronychia of toe  . Rectal bleeding  ER 10/13/2012, 06/30/2019   Past Surgical History:  Past Surgical History:  Procedure Laterality Date  . COLONOSCOPY 01/13/2000  . COLONOSCOPY 06/11/2010  repeat 06/11/2020 ([place])  . EGD 10/28/2012  No repeat per RTE  . FRACTURE SURGERY 11/28/2010  Calcaneal fracture  . HERNIA REPAIR Left 03/2004  Left inguinal, Dr. Jacqlyn Larsen  . Lipomas resected  low back  . Resection of basal cell 11/2006  . Right calcaneal fracture  with prior repair  . Surgery of heel Right 11/28/2010  . TONSILLECTOMY   Current Medications:  Current Outpatient Medications  Medication Sig Dispense Refill  . CYANOCOBALAMIN, VITAMIN B-12, (VITAMIN B-12 ORAL) Take by mouth 2500 mcg once daily  . GLUCOSAMINE HCL/CHONDR SU A NA (OSTEO BI-FLEX ORAL) Take by mouth. 2 daily  . glucosamine-chondroitin 250-200 mg Tab Take 2  tablets by mouth once daily  . tamsulosin (FLOMAX) 0.4 mg capsule Take 1 capsule (0.4 mg total) by mouth once daily Take 30 minutes after same meal each day. 90 capsule 4  . triamterene-hydrochlorothiazide (MAXZIDE-25) 37.5-25 mg tablet TAKE 1/2 TABLET BY MOUTH ONCE DAILY 45 tablet 1  .  warfarin (COUMADIN) 4 MG tablet Take 1 tablet (4 mg total) by mouth once daily 90 tablet 3   No current facility-administered medications for this visit.   Allergies:  Allergies  Allergen Reactions  . Codeine Sulfate Nausea  . Sulfa (Sulfonamide Antibiotics) Nausea   Social History:  Social History   Socioeconomic History  . Marital status: Married  Spouse name: [name]  . Number of children: 2  . Years of education: 50  . Highest education level: Associate degree: occupational, Hotel manager, or vocational program  Occupational History  . Occupation: Retired  Tobacco Use  . Smoking status: Former Smoker  Packs/day: 1.00  Years: 22.00  Pack years: 22.00  Types: Cigarettes  Start date: 05/17/1959  Quit date: 02/04/1981  Years since quitting: 39.6  . Smokeless tobacco: Never Used  . Tobacco comment: quit 1982  Substance and Sexual Activity  . Alcohol use: Never  . Drug use: Never  . Sexual activity: Not Currently  Partners: Female  Birth control/protection: None  Other Topics Concern  . Not on file  Social History Narrative  . Not on file   Social Determinants of Health   Financial Resource Strain: Not on file  Food Insecurity: Not on file  Transportation Needs: Not on file  Physical Activity: Not on file  Stress: Not on file  Social Connections: Not on file  Housing Stability: Not on file   Family History:  Family History  Problem Relation Age of Onset  . Myocardial Infarction (Heart attack) Father  . Skin cancer Mother   Review of Systems:   A 10+ ROS was performed, reviewed, and the pertinent orthopaedic findings are documented in the HPI.   Physical Examination:   BP 138/68  Ht 172.7 cm (5\' 8" )  Wt 94.3 kg (207 lb 12.8 oz)  BMI 31.60 kg/m   Patient is a well-developed, well-nourished male in no acute distress. Patient has normal mood and affect. Patient is alert and oriented to person, place, and time.   HEENT: Atraumatic, normocephalic. Pupils equal  and reactive to light. Extraocular motion intact. Noninjected sclera.  Cardiovascular: Regular rate and rhythm, with no murmurs, rubs, or gallops. Distal pulses palpable.  Respiratory: Lungs clear to auscultation bilaterally.   Right Hip: Pelvic tilt: Negative Limb lengths: Equal with the patient standing Soft tissue swelling: Negative Erythema: Negative Crepitance: Mild Tenderness: Greater trochanter is nontender to palpation. Moderate pain is elicited by axial compression or extremes of rotation. Atrophy: No atrophy. Fair to good hip flexor and abductor strength. Range of Motion: EXT/FLEX: 0/0/110 ADD/ABD: 20/0/10 IR/ER: 0/0/30  Able to actively plantarflex and dorsiflex the right ankle.   Sensation is intact over the saphenous, lateral cutaneous, superficial fibular, and deep fibular nerve distributions.  Tests Performed/Reviewed:  X-rays  Anteroposterior view of the pelvis as well as anteroposterior and lateral views of the right hip were obtained. Images reveal loss of femoral acetabular joint space with significant acetabular osteophyte formation noted. Flattening of the humeral head is noted. Subchondral sclerosis is noted superiorly of the femoral head. No fractures or dislocations.  I personally ordered and reviewed today's radiographs.  Impression:   ICD-10-CM  1. Primary osteoarthritis of right hip M16.11  2. 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 It was explained to the patient that the condition is progressive in nature. Having failed conservative treatment, the patient has elected to proceed with a total joint arthroplasty. The patient will undergo a total joint arthroplasty with Dr. Marry Guan pending cardiac clearance. The risks of surgery, including blood clot and infection, were discussed with the patient. Measures to reduce these risks, including the use of anticoagulation,  perioperative antibiotics, and early ambulation were discussed. The importance of postoperative physical therapy was discussed with the patient. The patient elects to proceed with surgery. The patient is instructed to stop all blood thinners prior to surgery. The patient is instructed to call the hospital the day before surgery to learn of the proper arrival time.   Contact our office with any questions or concerns. Follow up as indicated, or sooner should any new problems arise, if conditions worsen, or if they are otherwise concerned.   Gwenlyn Fudge, PA-C [facility] [place], [place] [address] Phone: [phone]  This note was generated in part with voice recognition software and I apologize for any typographical errors that were not detected and corrected.   Electronically signed by Gwenlyn Fudge, PA at 09/24/2020 1:24 PM EST

## 2020-10-21 ENCOUNTER — Inpatient Hospital Stay
Admission: RE | Admit: 2020-10-21 | Discharge: 2020-10-23 | DRG: 470 | Disposition: A | Discharge status: Home Health | Payer: Medicare HMO | Attending: Orthopedic Surgery | Admitting: Orthopedic Surgery

## 2020-10-21 ENCOUNTER — Inpatient Hospital Stay: Payer: Medicare HMO | Admitting: Anesthesiology

## 2020-10-21 ENCOUNTER — Other Ambulatory Visit: Payer: Self-pay

## 2020-10-21 ENCOUNTER — Inpatient Hospital Stay: Payer: Medicare HMO

## 2020-10-21 ENCOUNTER — Encounter: Payer: Self-pay | Admitting: Orthopedic Surgery

## 2020-10-21 ENCOUNTER — Encounter
Admission: RE | Disposition: A | Discharge status: Home Health | Payer: Self-pay | Source: Home / Self Care | Attending: Orthopedic Surgery

## 2020-10-21 DIAGNOSIS — Z85828 Personal history of other malignant neoplasm of skin: Secondary | ICD-10-CM

## 2020-10-21 DIAGNOSIS — Z86010 Personal history of colonic polyps: Secondary | ICD-10-CM

## 2020-10-21 DIAGNOSIS — Z808 Family history of malignant neoplasm of other organs or systems: Secondary | ICD-10-CM | POA: Diagnosis not present

## 2020-10-21 DIAGNOSIS — M879 Osteonecrosis, unspecified: Secondary | ICD-10-CM | POA: Diagnosis present

## 2020-10-21 DIAGNOSIS — Z86718 Personal history of other venous thrombosis and embolism: Secondary | ICD-10-CM | POA: Diagnosis not present

## 2020-10-21 DIAGNOSIS — I10 Essential (primary) hypertension: Secondary | ICD-10-CM | POA: Diagnosis present

## 2020-10-21 DIAGNOSIS — Z8249 Family history of ischemic heart disease and other diseases of the circulatory system: Secondary | ICD-10-CM | POA: Diagnosis not present

## 2020-10-21 DIAGNOSIS — M1611 Unilateral primary osteoarthritis, right hip: Principal | ICD-10-CM | POA: Diagnosis present

## 2020-10-21 DIAGNOSIS — Z923 Personal history of irradiation: Secondary | ICD-10-CM

## 2020-10-21 DIAGNOSIS — Z7901 Long term (current) use of anticoagulants: Secondary | ICD-10-CM

## 2020-10-21 DIAGNOSIS — E785 Hyperlipidemia, unspecified: Secondary | ICD-10-CM | POA: Diagnosis present

## 2020-10-21 DIAGNOSIS — Z8546 Personal history of malignant neoplasm of prostate: Secondary | ICD-10-CM | POA: Diagnosis not present

## 2020-10-21 DIAGNOSIS — Z96649 Presence of unspecified artificial hip joint: Secondary | ICD-10-CM

## 2020-10-21 DIAGNOSIS — Z87891 Personal history of nicotine dependence: Secondary | ICD-10-CM | POA: Diagnosis not present

## 2020-10-21 HISTORY — PX: TOTAL HIP ARTHROPLASTY: SHX124

## 2020-10-21 HISTORY — DX: Idiopathic aseptic necrosis of unspecified femur: M87.059

## 2020-10-21 HISTORY — DX: Hyperlipidemia, unspecified: E78.5

## 2020-10-21 LAB — PROTIME-INR
INR: 1 (ref 0.8–1.2)
Prothrombin Time: 13.1 seconds (ref 11.4–15.2)

## 2020-10-21 SURGERY — ARTHROPLASTY, HIP, TOTAL,POSTERIOR APPROACH
Anesthesia: Spinal | Site: Hip | Laterality: Right

## 2020-10-21 MED ORDER — CEFAZOLIN SODIUM-DEXTROSE 2-4 GM/100ML-% IV SOLN
2.0000 g | Freq: Four times a day (QID) | INTRAVENOUS | Status: AC
  Administered 2020-10-21: 2 g via INTRAVENOUS
  Filled 2020-10-21 (×4): qty 100

## 2020-10-21 MED ORDER — ACETAMINOPHEN 10 MG/ML IV SOLN
INTRAVENOUS | Status: AC
  Filled 2020-10-21: qty 100

## 2020-10-21 MED ORDER — CHLORHEXIDINE GLUCONATE 0.12 % MT SOLN
OROMUCOSAL | Status: AC
  Administered 2020-10-21: 15 mL via OROMUCOSAL
  Filled 2020-10-21: qty 15

## 2020-10-21 MED ORDER — GABAPENTIN 300 MG PO CAPS
ORAL_CAPSULE | ORAL | Status: AC
  Administered 2020-10-21: 300 mg via ORAL
  Filled 2020-10-21: qty 1

## 2020-10-21 MED ORDER — CEFAZOLIN SODIUM-DEXTROSE 2-4 GM/100ML-% IV SOLN
INTRAVENOUS | Status: AC
  Administered 2020-10-21: 2 g via INTRAVENOUS
  Filled 2020-10-21: qty 100

## 2020-10-21 MED ORDER — ONDANSETRON HCL 4 MG/2ML IJ SOLN: 4.0000 mg | Freq: Four times a day (QID) | INTRAMUSCULAR | Status: DC | PRN

## 2020-10-21 MED ORDER — MIDAZOLAM HCL 5 MG/5ML IJ SOLN
INTRAMUSCULAR | Status: DC | PRN
  Administered 2020-10-21: 2 mg via INTRAVENOUS

## 2020-10-21 MED ORDER — PROPOFOL 500 MG/50ML IV EMUL
INTRAVENOUS | Status: AC
  Filled 2020-10-21: qty 50

## 2020-10-21 MED ORDER — MIDAZOLAM HCL 2 MG/2ML IJ SOLN
INTRAMUSCULAR | Status: AC
  Filled 2020-10-21: qty 2

## 2020-10-21 MED ORDER — PROPOFOL 500 MG/50ML IV EMUL
INTRAVENOUS | Status: DC | PRN
  Administered 2020-10-21: 100 ug/kg/min via INTRAVENOUS

## 2020-10-21 MED ORDER — SENNOSIDES-DOCUSATE SODIUM 8.6-50 MG PO TABS
1.0000 | ORAL_TABLET | Freq: Two times a day (BID) | ORAL | Status: DC
  Administered 2020-10-21 – 2020-10-23 (×4): 1 via ORAL
  Filled 2020-10-21 (×4): qty 1

## 2020-10-21 MED ORDER — SODIUM CHLORIDE 0.9 % IV SOLN
INTRAVENOUS | Status: DC | PRN
  Administered 2020-10-21: 50 ug/min via INTRAVENOUS

## 2020-10-21 MED ORDER — ONDANSETRON HCL 4 MG/2ML IJ SOLN
INTRAMUSCULAR | Status: DC | PRN
  Administered 2020-10-21: 4 mg via INTRAVENOUS

## 2020-10-21 MED ORDER — PANTOPRAZOLE SODIUM 40 MG PO TBEC
40.0000 mg | DELAYED_RELEASE_TABLET | Freq: Two times a day (BID) | ORAL | Status: DC
  Administered 2020-10-21 – 2020-10-23 (×4): 40 mg via ORAL
  Filled 2020-10-21 (×4): qty 1

## 2020-10-21 MED ORDER — TRANEXAMIC ACID-NACL 1000-0.7 MG/100ML-% IV SOLN: 1000.0000 mg | Freq: Once | INTRAVENOUS | Status: AC

## 2020-10-21 MED ORDER — MENTHOL 3 MG MT LOZG
1.0000 | LOZENGE | OROMUCOSAL | Status: DC | PRN
  Filled 2020-10-21: qty 9

## 2020-10-21 MED ORDER — GABAPENTIN 300 MG PO CAPS
300.0000 mg | ORAL_CAPSULE | Freq: Every day | ORAL | Status: DC
  Administered 2020-10-22 (×2): 300 mg via ORAL
  Filled 2020-10-21: qty 1
  Filled 2020-10-21: qty 3

## 2020-10-21 MED ORDER — METOCLOPRAMIDE HCL 10 MG PO TABS
10.0000 mg | ORAL_TABLET | Freq: Three times a day (TID) | ORAL | Status: DC
  Administered 2020-10-21 – 2020-10-23 (×7): 10 mg via ORAL
  Filled 2020-10-21 (×7): qty 1

## 2020-10-21 MED ORDER — HYDROMORPHONE HCL 1 MG/ML IJ SOLN: 0.5000 mg | INTRAMUSCULAR | Status: DC | PRN

## 2020-10-21 MED ORDER — TRAMADOL HCL 50 MG PO TABS: 50.0000 mg | ORAL_TABLET | ORAL | Status: DC | PRN

## 2020-10-21 MED ORDER — CELECOXIB 200 MG PO CAPS
200.0000 mg | ORAL_CAPSULE | Freq: Two times a day (BID) | ORAL | Status: DC
  Administered 2020-10-21 – 2020-10-23 (×4): 200 mg via ORAL
  Filled 2020-10-21 (×4): qty 1

## 2020-10-21 MED ORDER — LIDOCAINE HCL (PF) 1 % IJ SOLN
INTRAMUSCULAR | Status: AC
  Filled 2020-10-21: qty 30

## 2020-10-21 MED ORDER — PHENOL 1.4 % MT LIQD
1.0000 | OROMUCOSAL | Status: DC | PRN
  Filled 2020-10-21: qty 177

## 2020-10-21 MED ORDER — FERROUS SULFATE 325 (65 FE) MG PO TABS
325.0000 mg | ORAL_TABLET | Freq: Two times a day (BID) | ORAL | Status: DC
  Administered 2020-10-21 – 2020-10-23 (×4): 325 mg via ORAL
  Filled 2020-10-21 (×4): qty 1

## 2020-10-21 MED ORDER — ALUM & MAG HYDROXIDE-SIMETH 200-200-20 MG/5ML PO SUSP: 30.0000 mL | ORAL | Status: DC | PRN

## 2020-10-21 MED ORDER — BUPIVACAINE HCL (PF) 0.5 % IJ SOLN
INTRAMUSCULAR | Status: DC | PRN
  Administered 2020-10-21: 3 mL

## 2020-10-21 MED ORDER — SODIUM CHLORIDE FLUSH 0.9 % IV SOLN
INTRAVENOUS | Status: AC
  Filled 2020-10-21: qty 40

## 2020-10-21 MED ORDER — FAMOTIDINE 20 MG PO TABS
ORAL_TABLET | ORAL | Status: AC
  Administered 2020-10-21: 20 mg via ORAL
  Filled 2020-10-21: qty 1

## 2020-10-21 MED ORDER — BUPIVACAINE HCL (PF) 0.25 % IJ SOLN
INTRAMUSCULAR | Status: AC
  Filled 2020-10-21: qty 240

## 2020-10-21 MED ORDER — TRANEXAMIC ACID-NACL 1000-0.7 MG/100ML-% IV SOLN
INTRAVENOUS | Status: AC
  Filled 2020-10-21: qty 100

## 2020-10-21 MED ORDER — SODIUM CHLORIDE 0.9 % IV SOLN: INTRAVENOUS | Status: DC

## 2020-10-21 MED ORDER — ONDANSETRON HCL 4 MG PO TABS: 4.0000 mg | ORAL_TABLET | Freq: Four times a day (QID) | ORAL | Status: DC | PRN

## 2020-10-21 MED ORDER — FENTANYL CITRATE (PF) 100 MCG/2ML IJ SOLN: 25.0000 ug | INTRAMUSCULAR | Status: DC | PRN

## 2020-10-21 MED ORDER — DEXAMETHASONE SODIUM PHOSPHATE 10 MG/ML IJ SOLN
INTRAMUSCULAR | Status: AC
  Administered 2020-10-21: 8 mg via INTRAVENOUS
  Filled 2020-10-21: qty 1

## 2020-10-21 MED ORDER — BUPIVACAINE LIPOSOME 1.3 % IJ SUSP
INTRAMUSCULAR | Status: AC
  Filled 2020-10-21: qty 20

## 2020-10-21 MED ORDER — LACTATED RINGERS IV SOLN: INTRAVENOUS | Status: DC | PRN

## 2020-10-21 MED ORDER — WARFARIN SODIUM 4 MG PO TABS
4.0000 mg | ORAL_TABLET | Freq: Once | ORAL | Status: AC
  Administered 2020-10-21: 4 mg via ORAL
  Filled 2020-10-21: qty 1

## 2020-10-21 MED ORDER — MAGNESIUM HYDROXIDE 400 MG/5ML PO SUSP
30.0000 mL | Freq: Every day | ORAL | Status: DC
  Administered 2020-10-21 – 2020-10-23 (×3): 30 mL via ORAL
  Filled 2020-10-21 (×3): qty 30

## 2020-10-21 MED ORDER — OXYCODONE HCL 5 MG PO TABS
5.0000 mg | ORAL_TABLET | ORAL | Status: DC | PRN
  Administered 2020-10-21: 5 mg via ORAL
  Filled 2020-10-21: qty 1

## 2020-10-21 MED ORDER — VITAMIN B-12 1000 MCG PO TABS
2500.0000 ug | ORAL_TABLET | Freq: Every day | ORAL | Status: DC
  Administered 2020-10-21 – 2020-10-23 (×3): 2500 ug via ORAL
  Filled 2020-10-21 (×3): qty 3

## 2020-10-21 MED ORDER — PROPOFOL 10 MG/ML IV BOLUS
INTRAVENOUS | Status: DC | PRN
  Administered 2020-10-21: 40 mg via INTRAVENOUS

## 2020-10-21 MED ORDER — ENSURE PRE-SURGERY PO LIQD
296.0000 mL | Freq: Once | ORAL | Status: DC
  Filled 2020-10-21: qty 296

## 2020-10-21 MED ORDER — BUPIVACAINE-EPINEPHRINE (PF) 0.5% -1:200000 IJ SOLN
INTRAMUSCULAR | Status: AC
  Filled 2020-10-21: qty 30

## 2020-10-21 MED ORDER — OXYCODONE HCL 5 MG PO TABS
ORAL_TABLET | ORAL | Status: AC
  Filled 2020-10-21: qty 1

## 2020-10-21 MED ORDER — ACETAMINOPHEN 10 MG/ML IV SOLN
INTRAVENOUS | Status: DC | PRN
  Administered 2020-10-21: 1000 mg via INTRAVENOUS

## 2020-10-21 MED ORDER — WARFARIN - PHARMACIST DOSING INPATIENT: Freq: Every day | Status: DC

## 2020-10-21 MED ORDER — TAMSULOSIN HCL 0.4 MG PO CAPS
0.4000 mg | ORAL_CAPSULE | Freq: Every day | ORAL | Status: DC
  Administered 2020-10-22 – 2020-10-23 (×2): 0.4 mg via ORAL
  Filled 2020-10-21 (×2): qty 1

## 2020-10-21 MED ORDER — NEOMYCIN-POLYMYXIN B GU 40-200000 IR SOLN
Status: DC | PRN
  Administered 2020-10-21: 4 mL

## 2020-10-21 MED ORDER — SURGIRINSE WOUND IRRIGATION SYSTEM - OPTIME
TOPICAL | Status: DC | PRN
  Administered 2020-10-21: 450 mL via TOPICAL

## 2020-10-21 MED ORDER — CELECOXIB 200 MG PO CAPS
ORAL_CAPSULE | ORAL | Status: AC
  Administered 2020-10-21: 400 mg via ORAL
  Filled 2020-10-21: qty 2

## 2020-10-21 MED ORDER — ONDANSETRON HCL 4 MG/2ML IJ SOLN: 4.0000 mg | Freq: Once | INTRAMUSCULAR | Status: DC | PRN

## 2020-10-21 MED ORDER — ACETAMINOPHEN 325 MG PO TABS: 325.0000 mg | ORAL_TABLET | Freq: Four times a day (QID) | ORAL | Status: DC | PRN

## 2020-10-21 MED ORDER — EPHEDRINE SULFATE 50 MG/ML IJ SOLN
INTRAMUSCULAR | Status: DC | PRN
  Administered 2020-10-21 (×4): 10 mg via INTRAVENOUS
  Administered 2020-10-21: 5 mg via INTRAVENOUS

## 2020-10-21 MED ORDER — ACETAMINOPHEN 10 MG/ML IV SOLN
1000.0000 mg | Freq: Four times a day (QID) | INTRAVENOUS | Status: AC
  Administered 2020-10-21 – 2020-10-22 (×4): 1000 mg via INTRAVENOUS
  Filled 2020-10-21 (×4): qty 100

## 2020-10-21 MED ORDER — DIPHENHYDRAMINE HCL 12.5 MG/5ML PO ELIX: 12.5000 mg | ORAL_SOLUTION | ORAL | Status: DC | PRN

## 2020-10-21 MED ORDER — TRIAMTERENE-HCTZ 37.5-25 MG PO TABS
0.5000 | ORAL_TABLET | Freq: Every day | ORAL | Status: DC
  Administered 2020-10-21 – 2020-10-23 (×3): 0.5 via ORAL
  Filled 2020-10-21 (×3): qty 0.5

## 2020-10-21 MED ORDER — BISACODYL 10 MG RE SUPP: 10.0000 mg | Freq: Every day | RECTAL | Status: DC | PRN

## 2020-10-21 MED ORDER — FLEET ENEMA 7-19 GM/118ML RE ENEM: 1.0000 | ENEMA | Freq: Once | RECTAL | Status: DC | PRN

## 2020-10-21 MED ORDER — TRANEXAMIC ACID-NACL 1000-0.7 MG/100ML-% IV SOLN
INTRAVENOUS | Status: AC
  Administered 2020-10-21: 1000 mg via INTRAVENOUS
  Filled 2020-10-21: qty 100

## 2020-10-21 SURGICAL SUPPLY — 66 items
ACETAB CUP 60 PIN300 121701060 (Joint) ×3 IMPLANT
BLADE DRUM FLTD (BLADE) ×3 IMPLANT
BLADE SAW 90X25X1.19 OSCILLAT (BLADE) ×3 IMPLANT
BNDG COHESIVE 4X5 TAN STRL (GAUZE/BANDAGES/DRESSINGS) ×2 IMPLANT
CANISTER SUCT 1200ML W/VALVE (MISCELLANEOUS) ×3 IMPLANT
CANISTER SUCT 3000ML PPV (MISCELLANEOUS) ×6 IMPLANT
CARTRIDGE OIL MAESTRO DRILL (MISCELLANEOUS) ×1 IMPLANT
COVER WAND RF STERILE (DRAPES) ×3 IMPLANT
CUP ACETAB 60 PIN300 121701060 (Joint) IMPLANT
DIFFUSER DRILL AIR PNEUMATIC (MISCELLANEOUS) ×3 IMPLANT
DRAPE 3/4 80X56 (DRAPES) ×3 IMPLANT
DRAPE INCISE IOBAN 66X60 STRL (DRAPES) ×3 IMPLANT
DRSG DERMACEA 8X12 NADH (GAUZE/BANDAGES/DRESSINGS) ×3 IMPLANT
DRSG MEPILEX SACRM 8.7X9.8 (GAUZE/BANDAGES/DRESSINGS) ×3 IMPLANT
DRSG OPSITE POSTOP 4X12 (GAUZE/BANDAGES/DRESSINGS) ×3 IMPLANT
DRSG OPSITE POSTOP 4X14 (GAUZE/BANDAGES/DRESSINGS) ×2 IMPLANT
DRSG TEGADERM 4X4.75 (GAUZE/BANDAGES/DRESSINGS) ×3 IMPLANT
DURAPREP 26ML APPLICATOR (WOUND CARE) ×3 IMPLANT
ELECT REM PT RETURN 9FT ADLT (ELECTROSURGICAL) ×3
ELECTRODE REM PT RTRN 9FT ADLT (ELECTROSURGICAL) ×1 IMPLANT
GLOVE BIO SURGEON STRL SZ7.5 (GLOVE) ×6 IMPLANT
GLOVE BIOGEL M STRL SZ7.5 (GLOVE) ×6 IMPLANT
GLOVE BIOGEL PI IND STRL 7.5 (GLOVE) ×1 IMPLANT
GLOVE BIOGEL PI INDICATOR 7.5 (GLOVE) ×2
GLOVE INDICATOR 8.0 STRL GRN (GLOVE) ×3 IMPLANT
GOWN STRL REUS W/ TWL LRG LVL3 (GOWN DISPOSABLE) ×2 IMPLANT
GOWN STRL REUS W/ TWL XL LVL3 (GOWN DISPOSABLE) ×1 IMPLANT
GOWN STRL REUS W/TWL LRG LVL3 (GOWN DISPOSABLE) ×4
GOWN STRL REUS W/TWL XL LVL3 (GOWN DISPOSABLE) ×2
HEAD M SROM 36MM PLUS 1.5 (Hips) IMPLANT
HEMOVAC 400CC 10FR (MISCELLANEOUS) ×3 IMPLANT
HOLDER FOLEY CATH W/STRAP (MISCELLANEOUS) ×3 IMPLANT
HOOD PEEL AWAY FLYTE STAYCOOL (MISCELLANEOUS) ×6 IMPLANT
IRRIGATION SURGIPHOR STRL (IV SOLUTION) ×3 IMPLANT
KIT PEG BOARD PINK (KITS) ×3 IMPLANT
KIT TURNOVER KIT A (KITS) ×3 IMPLANT
LINER MARATHON 4MM 10DEG 36X60 (Hips) ×2 IMPLANT
MANIFOLD NEPTUNE II (INSTRUMENTS) ×6 IMPLANT
NDL SAFETY ECLIPSE 18X1.5 (NEEDLE) ×1 IMPLANT
NEEDLE HYPO 18GX1.5 SHARP (NEEDLE) ×2
NS IRRIG 500ML POUR BTL (IV SOLUTION) ×3 IMPLANT
OIL CARTRIDGE MAESTRO DRILL (MISCELLANEOUS) ×3
PACK HIP PROSTHESIS (MISCELLANEOUS) ×3 IMPLANT
PENCIL SMOKE ULTRAEVAC 22 CON (MISCELLANEOUS) ×3 IMPLANT
PIN STEIN THRED 5/32 (Pin) ×3 IMPLANT
PULSAVAC PLUS IRRIG FAN TIP (DISPOSABLE) ×3
SOL .9 NS 3000ML IRR  AL (IV SOLUTION) ×2
SOL .9 NS 3000ML IRR UROMATIC (IV SOLUTION) ×1 IMPLANT
SOL PREP PVP 2OZ (MISCELLANEOUS) ×3
SOLUTION PREP PVP 2OZ (MISCELLANEOUS) ×1 IMPLANT
SPONGE DRAIN TRACH 4X4 STRL 2S (GAUZE/BANDAGES/DRESSINGS) ×3 IMPLANT
SROM M HEAD 36MM PLUS 1.5 (Hips) ×3 IMPLANT
STAPLER SKIN PROX 35W (STAPLE) ×3 IMPLANT
STEM FEM CMNTLSS LG AML 15.0 (Hips) ×2 IMPLANT
SUT ETHIBOND #5 BRAIDED 30INL (SUTURE) ×3 IMPLANT
SUT VIC AB 0 CT1 36 (SUTURE) ×3 IMPLANT
SUT VIC AB 1 CT1 36 (SUTURE) ×6 IMPLANT
SUT VIC AB 2-0 CT1 27 (SUTURE) ×2
SUT VIC AB 2-0 CT1 TAPERPNT 27 (SUTURE) ×1 IMPLANT
SYR 20ML LL LF (SYRINGE) ×3 IMPLANT
SYR BULB IRRIG 60ML STRL (SYRINGE) ×2 IMPLANT
TAPE CLOTH 3X10 WHT NS LF (GAUZE/BANDAGES/DRESSINGS) ×3 IMPLANT
TAPE TRANSPORE STRL 2 31045 (GAUZE/BANDAGES/DRESSINGS) ×3 IMPLANT
TIP FAN IRRIG PULSAVAC PLUS (DISPOSABLE) ×1 IMPLANT
TOWEL OR 17X26 4PK STRL BLUE (TOWEL DISPOSABLE) ×3 IMPLANT
TRAY FOLEY MTR SLVR 16FR STAT (SET/KITS/TRAYS/PACK) ×3 IMPLANT

## 2020-10-21 NOTE — Progress Notes (Signed)
ANTICOAGULATION CONSULT NOTE - Initial Consult  Pharmacy Consult for Warfarin  Indication: history of DVT   Allergies  Allergen Reactions  . Codeine Nausea Only  . Sulfa Antibiotics Nausea And Vomiting    Patient Measurements:   Heparin Dosing Weight:   Vital Signs:    Labs: No results for input(s): HGB, HCT, PLT, APTT, LABPROT, INR, HEPARINUNFRC, HEPRLOWMOCWT, CREATININE, CKTOTAL, CKMB, TROPONINIHS in the last 72 hours.  CrCl cannot be calculated (Patient's most recent lab result is older than the maximum 21 days allowed.).   Medical History: Past Medical History:  Diagnosis Date  . Arthritis   . AVN of femur ([place])    RIGHT  . DVT (deep venous thrombosis) ([place])   . GERD (gastroesophageal reflux disease)   . Hyperlipemia   . Hypertension   . 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 Prostate cancer ([place]) 2018   Treated with radiation  . Rectal bleeding    2013, 2020    Medications:  Medications Prior to Admission  Medication Sig Dispense Refill Last Dose  . Cyanocobalamin 2500 MCG SUBL Take 2,500 mcg by mouth daily.     . Glucosamine-Chondroitin (OSTEO BI-FLEX REGULAR STRENGTH PO) Take 2 tablets by mouth daily.     . tamsulosin (FLOMAX) 0.4 MG CAPS capsule Take 0.4 mg by mouth daily.     Marland Kitchen triamterene-hydrochlorothiazide (MAXZIDE-25) 37.5-25 MG tablet Take 0.5 tablets by mouth daily.      Marland Kitchen warfarin (COUMADIN) 4 MG tablet Take 4 mg by mouth daily.       Assessment: Pharmacy consulted to dose warfarin in this 74 year old male admitted for hip replacement surgery.  Pt was on warfarin 4 mg PO daily PTA.   INR ordered for 12/6 prior to surgery.  Goal of Therapy:  INR 2-3   Plan:  Will resume warfarin home dose after surgery, most likely on 12/6 @ 1800.  Will recheck INR on 12/7 with AM labs.   Tyan Dy D 10/21/2020,6:15 AM

## 2020-10-21 NOTE — Plan of Care (Signed)
  Problem: Education: Goal: Knowledge of General Education information will improve Description: Including pain rating scale, medication(s)/side effects and non-pharmacologic comfort measures Outcome: Progressing   Problem: Health Behavior/Discharge Planning: Goal: Ability to manage health-related needs will improve Outcome: Progressing   Problem: Clinical Measurements: Goal: Ability to maintain clinical measurements within normal limits will improve Outcome: Progressing Goal: Will remain free from infection Outcome: Progressing Goal: Diagnostic test results will improve Outcome: Progressing Goal: Respiratory complications will improve Outcome: Progressing Goal: Cardiovascular complication will be avoided Outcome: Progressing   Problem: Activity: Goal: Risk for activity intolerance will decrease Outcome: Progressing   Problem: Nutrition: Goal: Adequate nutrition will be maintained Outcome: Progressing   Problem: Coping: Goal: Level of anxiety will decrease Outcome: Progressing   Problem: Elimination: Goal: Will not experience complications related to bowel motility Outcome: Progressing Goal: Will not experience complications related to urinary retention Outcome: Progressing   Problem: Safety: Goal: Ability to remain free from injury will improve Outcome: Progressing   Problem: Pain Managment: Goal: General experience of comfort will improve Outcome: Progressing   Problem: Skin Integrity: Goal: Risk for impaired skin integrity will decrease Outcome: Progressing   Problem: Education: Goal: Knowledge of the prescribed therapeutic regimen will improve Outcome: Progressing Goal: Understanding of discharge needs will improve Outcome: Progressing Goal: Individualized Educational Video(s) Outcome: Progressing   Problem: Activity: Goal: Ability to avoid complications of mobility impairment will improve Outcome: Progressing Goal: Ability to tolerate increased  activity will improve Outcome: Progressing   Problem: Clinical Measurements: Goal: Postoperative complications will be avoided or minimized Outcome: Progressing   Problem: Pain Management: Goal: Pain level will decrease with appropriate interventions Outcome: Progressing   Problem: Skin Integrity: Goal: Will show signs of wound healing Outcome: Progressing  Pt is post op r hip replacement - dressing clean Bougie and intact- - honeycomb in place - pt rates pain 3/10 and has had oxycodone just prior to arriving to floor  - wife present at bedside - pt denies needs  at this time

## 2020-10-21 NOTE — Progress Notes (Signed)
   10/21/20 0750  Clinical Encounter Type  Visited With Family  Visit Type Initial  Referral From Chaplain  Consult/Referral To Chaplain  While rounding SDS waiting area, chaplain briefly visited with Pt's wife to find out how she was doing while waiting. She said she was fine and didn't have any questions or concerns. Pt's wife said she received a text saying Pt was in surgery. Chaplain was glad that staff was keeping her posted.

## 2020-10-21 NOTE — Evaluation (Signed)
Physical Therapy Evaluation Patient Details Name: [name] [name] MRN: [id number] DOB: [birth year] Today's Date: 10/21/2020   History of Present Illness  Pt admitted for R THR. History includes prostate cancer, DDD, and GERD.  Clinical Impression  Pt is a pleasant 74 year old male who was admitted for R THR. Pt performs bed mobility with min assist, transfers with cga, and ambulation with cga and RW. Pt demonstrates deficits with strength/mobility/pain. Educated in hip precautions. Appears very motivated to participate. Would benefit from skilled PT to address above deficits and promote optimal return to PLOF. Recommend transition to [place] upon discharge from acute hospitalization.     Follow Up Recommendations Home health PT    Equipment Recommendations  None recommended by PT    Recommendations for Other Services       Precautions / Restrictions Precautions Precautions: Fall;Posterior Hip Precaution Booklet Issued: No Restrictions Weight Bearing Restrictions: Yes RLE Weight Bearing: Weight bearing as tolerated      Mobility  Bed Mobility Overal bed mobility: Needs Assistance Bed Mobility: Supine to Sit     Supine to sit: Min assist     General bed mobility comments: safe technique with cues for sequencing. Once seated, needs cues for maintaining hip precautions.    Transfers Overall transfer level: Needs assistance Equipment used: Rolling walker (2 wheeled) Transfers: Sit to/from Stand Sit to Stand: Min guard         General transfer comment: safe technique with cues to push from seated surface  Ambulation/Gait Ambulation/Gait assistance: Min guard Gait Distance (Feet): 5 Feet Assistive device: Rolling walker (2 wheeled) Gait Pattern/deviations: Step-to pattern     General Gait Details: ambulated over to recliner. Safe technique with cues for RW advancment  Stairs            Wheelchair Mobility    Modified Rankin (Stroke Patients Only)        Balance Overall balance assessment: Needs assistance Sitting-balance support: No upper extremity supported;Feet supported Sitting balance-Leahy Scale: Good     Standing balance support: Bilateral upper extremity supported Standing balance-Leahy Scale: Good                               Pertinent Vitals/Pain Pain Assessment: 0-10 Pain Score: 5  Pain Location: R hip Pain Descriptors / Indicators: Operative site guarding Pain Intervention(s): Limited activity within patient's tolerance;Ice applied    Home Living Family/patient expects to be discharged to:: Private residence Living Arrangements: Spouse/significant other Available Help at Discharge: Family Type of Home: House Home Access: Stairs to enter Entrance Stairs-Rails: None Entrance Stairs-Number of Steps: 1 Home Layout: Multi-level;Able to live on main level with bedroom/bathroom Home Equipment: Gilford Rile - 2 wheels;Walker - 4 wheels;Bedside commode;Cane - single point;Shower seat Additional Comments: inherited all DME from mother    Prior Function Level of Independence: Independent with assistive device(s)         Comments: was using SPC pretty consistently due to hip pain     Hand Dominance        Extremity/Trunk Assessment   Upper Extremity Assessment Upper Extremity Assessment: Overall WFL for tasks assessed    Lower Extremity Assessment Lower Extremity Assessment: Generalized weakness (R LE grossly 4/5; L LE grossly 5/5)       Communication   Communication: No difficulties  Cognition Arousal/Alertness: Awake/alert Behavior During Therapy: WFL for tasks assessed/performed Overall Cognitive Status: Within Functional Limits for tasks assessed  General Comments      Exercises Other Exercises Other Exercises: supine therex performed on R LE Including AP, quad sets, and SAQ. All ther0-ex performed x 10 reps with cues for sequencing    Assessment/Plan    PT Assessment Patient needs continued PT services  PT Problem List Decreased strength;Decreased balance;Decreased mobility;Pain;Decreased knowledge of use of DME       PT Treatment Interventions DME instruction;Gait training;Stair training;Therapeutic exercise    PT Goals (Current goals can be found in the Care Plan section)  Acute Rehab PT Goals Patient Stated Goal: to go home PT Goal Formulation: With patient Time For Goal Achievement: 11/04/20 Potential to Achieve Goals: Good    Frequency BID   Barriers to discharge        Co-evaluation               AM-PAC PT "6 Clicks" Mobility  Outcome Measure Help needed turning from your back to your side while in a flat bed without using bedrails?: A Little Help needed moving from lying on your back to sitting on the side of a flat bed without using bedrails?: A Little Help needed moving to and from a bed to a chair (including a wheelchair)?: A Little Help needed standing up from a chair using your arms (e.g., wheelchair or bedside chair)?: A Little Help needed to walk in hospital room?: A Little Help needed climbing 3-5 steps with a railing? : A Little 6 Click Score: 18    End of Session Equipment Utilized During Treatment: Gait belt Activity Tolerance: Patient tolerated treatment well Patient left: in chair;with chair alarm set;with SCD's reapplied Nurse Communication: Mobility status PT Visit Diagnosis: Muscle weakness (generalized) (M62.81);Difficulty in walking, not elsewhere classified (R26.2);Pain Pain - Right/Left: Right Pain - part of body: Hip    Time: 7026-3785 PT Time Calculation (min) (ACUTE ONLY): 29 min   Charges:   PT Evaluation $PT Eval Low Complexity: 1 Low PT Treatments $Therapeutic Exercise: 8-22 mins        Greggory Stallion, PT, DPT [phone]   Kody Vigil 10/21/2020, 4:32 PM

## 2020-10-21 NOTE — Anesthesia Preprocedure Evaluation (Signed)
Anesthesia Evaluation  Patient identified by MRN, date of birth, ID band Patient awake    Reviewed: Allergy & Precautions, H&P , NPO status , reviewed documented beta blocker date and time   History of Anesthesia Complications Negative for: history of anesthetic complications  Airway Mallampati: II  TM Distance: >3 FB Neck ROM: full    Dental  (+) Chipped, Teeth Intact, Dental Advidsory Given, Caps, Missing   Pulmonary neg pulmonary ROS,    Pulmonary exam normal        Cardiovascular Exercise Tolerance: Good hypertension, (-) angina+ DVT  (-) Past MI and (-) Cardiac Stents Normal cardiovascular exam(-) dysrhythmias (-) Valvular Problems/Murmurs     Neuro/Psych negative neurological ROS  negative psych ROS   GI/Hepatic Neg liver ROS, GERD  ,  Endo/Other  negative endocrine ROS  Renal/GU negative Renal ROS     Musculoskeletal   Abdominal   Peds  Hematology   Anesthesia Other Findings Past Medical History: No date: DVT (deep venous thrombosis) (HCC) No date: Hypertension  Past Surgical History: No date: HERNIA REPAIR  BMI    Body Mass Index: 28.94 kg/m   INR today 1.5      Reproductive/Obstetrics                             Anesthesia Physical  Anesthesia Plan  ASA: II  Anesthesia Plan: Spinal   Post-op Pain Management:    Induction:   PONV Risk Score and Plan: 1 and Treatment may vary due to age or medical condition, TIVA and Propofol infusion  Airway Management Planned: Nasal Cannula and Natural Airway  Additional Equipment:   Intra-op Plan:   Post-operative Plan:   Informed Consent: I have reviewed the patients History and Physical, chart, labs and discussed the procedure including the risks, benefits and alternatives for the proposed anesthesia with the patient or authorized representative who has indicated his/her understanding and acceptance.     Dental  Advisory Given  Plan Discussed with: CRNA  Anesthesia Plan Comments:         Anesthesia Quick Evaluation

## 2020-10-21 NOTE — Anesthesia Procedure Notes (Signed)
Spinal  Patient location during procedure: OR Start time: 10/21/2020 7:20 AM End time: 10/21/2020 7:24 AM Staffing Performed: resident/CRNA  Resident/CRNA: Nelda Marseille, CRNA Preanesthetic Checklist Completed: patient identified, IV checked, site marked, risks and benefits discussed, surgical consent, monitors and equipment checked, pre-op evaluation and timeout performed Spinal Block Patient position: sitting Prep: Betadine Patient monitoring: heart rate, continuous pulse ox, blood pressure and cardiac monitor Approach: midline Location: L4-5 Injection technique: single-shot Needle Needle type: Whitacre and Introducer  Needle gauge: 25 G Needle length: 9 cm Assessment Sensory level: T10 Additional Notes Negative paresthesia. Negative blood return. Positive free-flowing CSF. Expiration date of kit checked and confirmed. Patient tolerated procedure well, without complications.

## 2020-10-21 NOTE — Op Note (Signed)
OPERATIVE NOTE  DATE OF SURGERY:  10/21/2020  PATIENT NAME:  [name] [name]   DOB: Feb 28, 1946  MRN: [id number]  PRE-OPERATIVE DIAGNOSIS: Degenerative arthrosis of the right hip, primary  POST-OPERATIVE DIAGNOSIS:  Same  PROCEDURE:  Right total hip arthroplasty  SURGEON:  Marciano Sequin. M.D.  ASSISTANT: Vance Peper, PA (present and scrubbed throughout the case, critical for assistance with exposure, retraction, instrumentation, and closure)  ANESTHESIA: spinal  ESTIMATED BLOOD LOSS: 100 mL  FLUIDS REPLACED: 1700 mL of crystalloid  DRAINS: 2 medium Hemovac drains  IMPLANTS UTILIZED: DePuy 15 mm large stature AML femoral stem, 60 mm OD Pinnacle 100 acetabular component, neutral Pinnacle Marathon polyethylene insert, and a 36 mm M-SPEC +1.5 mm hip ball  INDICATIONS FOR SURGERY: [name] [name] is a 74 y.o. year old male with a long history of progressive hip and groin  pain. X-rays demonstrated severe degenerative changes. The patient had not seen any significant improvement despite conservative nonsurgical intervention. After discussion of the risks and benefits of surgical intervention, the patient expressed understanding of the risks benefits and agree with plans for total hip arthroplasty.   The risks, benefits, and alternatives were discussed at length including but not limited to the risks of infection, bleeding, nerve injury, stiffness, blood clots, the need for revision surgery, limb length inequality, dislocation, cardiopulmonary complications, among others, and they were willing to proceed.  PROCEDURE IN DETAIL: The patient was brought into the operating room and, after adequate spinal anesthesia was achieved, the patient was placed in a left lateral decubitus position. Axillary roll was placed and all bony prominences were well-padded. The patient's right hip was cleaned and prepped with alcohol and DuraPrep and draped in the usual sterile fashion. A "timeout" was performed as per usual  protocol. A lateral curvilinear incision was made gently curving towards the posterior superior iliac spine. The IT band was incised in line with the skin incision and the fibers of the gluteus maximus were split in line. The piriformis tendon was identified, skeletonized, and incised at its insertion to the proximal femur and reflected posteriorly. A T type posterior capsulotomy was performed. Prior to dislocation of the femoral head, a threaded Steinmann pin was inserted through a separate stab incision into the pelvis superior to the acetabulum and bent in the form of a stylus so as to assess limb length and hip offset throughout the procedure. The femoral head was then dislocated posteriorly. Inspection of the femoral head demonstrated severe degenerative changes with full-thickness loss of articular cartilage. The femoral neck cut was performed using an oscillating saw. The anterior capsule was elevated off of the femoral neck using a periosteal elevator. Attention was then directed to the acetabulum. The remnant of the labrum was excised using electrocautery. Inspection of the acetabulum also demonstrated significant degenerative changes. The acetabulum was reamed in sequential fashion up to a 59 mm diameter. Good punctate bleeding bone was encountered. A 60 mm Pinnacle 100 acetabular component was positioned and impacted into place. Good scratch fit was appreciated. A neutral polyethylene trial was inserted.  Attention was then directed to the proximal femur. A hole for reaming of the proximal femoral canal was created using a high-speed burr. The femoral canal was reamed in sequential fashion up to a 14.5 mm diameter. This allowed for approximately 6 cm of scratch fit.  It was thus elected to ream up to a 15 mm diameter to allow for a line to line fit.  Serial broaches were inserted up  to a 15 mm large stature femoral broach. Calcar region was planed and a trial reduction was performed using a 36 mm hip  ball with a +1.5 mm neck length. Good equalization of limb lengths and hip offset was appreciated and excellent stability was noted both anteriorly and posteriorly. Trial components were removed. The acetabular shell was irrigated with copious amounts of normal saline with antibiotic solution and suctioned Whitebread. A neutral Pinnacle Marathon polyethylene insert was positioned and impacted into place. Next, a 15 mm large stature AML femoral stem was positioned and impacted into place. Excellent scratch fit was appreciated. A trial reduction was again performed with a 36 mm hip ball with a +1.5 mm neck length. Again, good equalization of limb lengths was appreciated and excellent stability appreciated both anteriorly and posteriorly. The hip was then dislocated and the trial hip ball was removed. The Morse taper was cleaned and dried. A 36 mm M-SPEC hip ball with a +1.5 mm neck length was placed on the trunnion and impacted into place. The hip was then reduced and placed through range of motion. Excellent stability was appreciated both anteriorly and posteriorly.  The wound was irrigated with copious amounts of normal saline followed by 500 ml of Surgiphor and suctioned Vanhoesen. Good hemostasis was appreciated. The posterior capsulotomy was repaired using #5 Ethibond. Piriformis tendon was reapproximated to the undersurface of the gluteus medius tendon using #5 Ethibond. The IT band was reapproximated using interrupted sutures of #1 Vicryl. Subcutaneous tissue was approximated using first #0 Vicryl followed by #2-0 Vicryl. The skin was closed with skin staples.  The patient tolerated the procedure well and was transported to the recovery room in stable condition.   Marciano Sequin., M.D.

## 2020-10-21 NOTE — H&P (Signed)
The patient has been re-examined, and the chart reviewed, and there have been no interval changes to the documented history and physical.    The risks, benefits, and alternatives have been discussed at length. The patient expressed understanding of the risks benefits and agreed with plans for surgical intervention.  Marice Angelino P. Aryan Sparks, Jr. M.D.    

## 2020-10-21 NOTE — Transfer of Care (Signed)
Immediate Anesthesia Transfer of Care Note  Patient: [name] [name]  Procedure(s) Performed: TOTAL HIP ARTHROPLASTY (Right Hip)  Patient Location: PACU  Anesthesia Type:Spinal  Level of Consciousness: sedated  Airway & Oxygen Therapy: Patient Spontanous Breathing and Patient connected to face mask oxygen  Post-op Assessment: Report given to RN and Post -op Vital signs reviewed and stable  Post vital signs: Reviewed and stable  Last Vitals:  Vitals Value Taken Time  BP    Temp    Pulse    Resp    SpO2      Last Pain:  Vitals:   10/21/20 0625  TempSrc: Oral  PainSc: 0-No pain         Complications: No complications documented.

## 2020-10-21 NOTE — Anesthesia Procedure Notes (Signed)
Date/Time: 10/21/2020 7:44 AM Performed by: Nelda Marseille, CRNA Pre-anesthesia Checklist: Patient identified, Emergency Drugs available, Suction available, Patient being monitored and Timeout performed Oxygen Delivery Method: Simple face mask

## 2020-10-22 ENCOUNTER — Encounter: Payer: Self-pay | Admitting: Orthopedic Surgery

## 2020-10-22 LAB — PROTIME-INR
INR: 1.1 (ref 0.8–1.2)
Prothrombin Time: 13.4 seconds (ref 11.4–15.2)

## 2020-10-22 LAB — SURGICAL PATHOLOGY

## 2020-10-22 MED ORDER — WARFARIN SODIUM 4 MG PO TABS
4.0000 mg | ORAL_TABLET | Freq: Once | ORAL | Status: AC
  Administered 2020-10-22: 4 mg via ORAL
  Filled 2020-10-22: qty 1

## 2020-10-22 NOTE — Progress Notes (Signed)
ANTICOAGULATION CONSULT NOTE - Initial Consult  Pharmacy Consult for Warfarin  Indication: history of DVT   Allergies  Allergen Reactions  . Codeine Nausea Only  . Sulfa Antibiotics Nausea And Vomiting    Patient Measurements: Height: 5\' 10"  (177.8 cm) Weight: 95.6 kg (210 lb 12.8 oz) IBW/kg (Calculated) : 73 Heparin Dosing Weight:   Vital Signs: Temp: 97.8 F (36.6 C) (12/07 0421) Temp Source: Oral (12/07 0421) BP: 121/55 (12/07 0421) Pulse Rate: 63 (12/07 0421)  Labs: Recent Labs    10/21/20 0644 10/22/20 0437  LABPROT 13.1 13.4  INR 1.0 1.1    CrCl cannot be calculated (Patient's most recent lab result is older than the maximum 21 days allowed.).   Medical History: Past Medical History:  Diagnosis Date  . Arthritis   . 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 DVT (deep venous thrombosis) ([place])   . GERD (gastroesophageal reflux disease)   . Hyperlipemia   . Hypertension   . 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 Prostate cancer ([place]) 2018   Treated with radiation  . Rectal bleeding    2013, 2020    Medications:  Medications Prior to Admission  Medication Sig Dispense Refill Last Dose  . Cyanocobalamin 2500 MCG SUBL Take 2,500 mcg by mouth daily.   10/19/2020  . Glucosamine-Chondroitin (OSTEO BI-FLEX REGULAR STRENGTH PO) Take 2 tablets by mouth daily.   10/14/2020  . tamsulosin (FLOMAX) 0.4 MG CAPS capsule Take 0.4 mg by mouth daily.   10/21/2020 at Unknown time  . triamterene-hydrochlorothiazide (MAXZIDE-25) 37.5-25 MG tablet Take 0.5 tablets by mouth daily.    10/19/2020  . warfarin (COUMADIN) 4 MG tablet Take 4 mg by mouth daily.   10/13/2020    Assessment: Pharmacy consulted to dose warfarin in this 74 year old male admitted for hip replacement surgery.  Pt was on warfarin 4 mg PO daily PTA.   INR ordered for 12/6 prior to surgery.  Date INR Dose 12/6 1.0 4mg  12/7 1.1 4mg   Goal of Therapy:  INR 2-3   Plan:  Restarted warfarin home dose 4mg  after surgery  on 12/6. INR 1.1 this AM. Will continue with home regimen, 4mg  tonight. Will recheck INR on 12/8 with AM labs.   [name] [name] 10/22/2020,7:09 AM

## 2020-10-22 NOTE — Progress Notes (Signed)
Physical Therapy Treatment Patient Details Name: [name] [name] MRN: [id number] DOB: [birth year] Today's Date: 10/22/2020    History of Present Illness Pt admitted for R THR. History includes prostate cancer, DDD, and GERD.    PT Comments    Pt seen this pm for continued PT with focus on functional mobility and stair training per POC.  Pt received in bed, agreeable to second session.  Discussed hip precautions prior to mobility. Pt able to demonstrate supine to sit with supervision and good compliance with R LE precautions.  Sit to stand with supervision, gait training with RW total of 250 feet with supervision and minimal favoring of R LE.  Pt able to negotiate 4 steps with B hand rails and supervision. Negotiation of single step without rails and use of RW with good demonstration of technique and SBA for safety.  Pt demonstrated good safety awareness and progression with mobility since seen this am.  Pt awaiting return home tomorrow with continued PT services.    Follow Up Recommendations  Home health PT     Equipment Recommendations  None recommended by PT    Recommendations for Other Services       Precautions / Restrictions Precautions Precautions: Fall;Posterior Hip Precaution Booklet Issued: Yes (comment) Precaution Comments: Pt educated again on hip precautions with good understanding Restrictions Weight Bearing Restrictions: Yes RLE Weight Bearing: Weight bearing as tolerated    Mobility  Bed Mobility Overal bed mobility: Modified Independent Bed Mobility: Supine to Sit     Supine to sit: Min guard     General bed mobility comments: Sit to supine with CGA for R LE  Transfers Overall transfer level: Needs assistance Equipment used: Rolling walker (2 wheeled) Transfers: Sit to/from Stand Sit to Stand: Min guard         General transfer comment: cues for maintaining hip precautions. Upright posture noted  Ambulation/Gait Ambulation/Gait assistance: Supervision  Gait Distance (Feet): 250 Feet Assistive device: Rolling walker (2 wheeled) Gait Pattern/deviations: Step-through pattern     General Gait Details: ambulated around RN station with step to gait pattern progressing to reciprocal gait pattern, however inconsisten. Fatigues quickly   Stairs Stairs: Yes Stairs assistance: Supervision Stair Management: Two rails Number of Stairs: 4 General stair comments: Pt also demonstrated ability to negotiate single step with use of RW, no hand rails and SBA for safety   Wheelchair Mobility    Modified Rankin (Stroke Patients Only)       Balance Overall balance assessment: Needs assistance Sitting-balance support: No upper extremity supported;Feet supported Sitting balance-Leahy Scale: Good     Standing balance support: During functional activity;Bilateral upper extremity supported Standing balance-Leahy Scale: Good                              Cognition Arousal/Alertness: Awake/alert Behavior During Therapy: WFL for tasks assessed/performed Overall Cognitive Status: Within Functional Limits for tasks assessed                                 General Comments: Pleasant and good safety awareness      Exercises Other Exercises Other Exercises: Seated ther-ex performed on R LE including AP, quad sets, glut sets, hip add squeezes, SLRs, heel slides, LAQ, and hip abd/add. All ther-ex performed x 12 reps. Gave and reviewed written HEP    General Comments  Pertinent Vitals/Pain Pain Assessment: 0-10 Pain Score: 2  Pain Location: R hip Pain Descriptors / Indicators: Operative site guarding Pain Intervention(s): Monitored during session    Home Living                      Prior Function            PT Goals (current goals can now be found in the care plan section) Acute Rehab PT Goals Patient Stated Goal: to go home PT Goal Formulation: With patient Time For Goal Achievement: 11/04/20  Potential to Achieve Goals: Good Progress towards PT goals: Progressing toward goals    Frequency    BID      PT Plan Current plan remains appropriate    Co-evaluation              AM-PAC PT "6 Clicks" Mobility   Outcome Measure  Help needed turning from your back to your side while in a flat bed without using bedrails?: A Little Help needed moving from lying on your back to sitting on the side of a flat bed without using bedrails?: A Little Help needed moving to and from a bed to a chair (including a wheelchair)?: A Little Help needed standing up from a chair using your arms (e.g., wheelchair or bedside chair)?: A Little Help needed to walk in hospital room?: A Little Help needed climbing 3-5 steps with a railing? : A Little 6 Click Score: 18    End of Session Equipment Utilized During Treatment: Gait belt Activity Tolerance: Patient tolerated treatment well Patient left: in bed;with bed alarm set;with SCD's reapplied Nurse Communication: Mobility status PT Visit Diagnosis: Muscle weakness (generalized) (M62.81);Difficulty in walking, not elsewhere classified (R26.2);Pain Pain - Right/Left: Right Pain - part of body: Hip     Time: 0813-8871 PT Time Calculation (min) (ACUTE ONLY): 32 min  Charges:  $Gait Training: 23-37 mins $Therapeutic Exercise: 8-22 mins                     Mikel Cella, PTA  [name] 10/22/2020, 2:52 PM

## 2020-10-22 NOTE — Progress Notes (Signed)
  Subjective: 1 Day Post-Op Procedure(s) (LRB): TOTAL HIP ARTHROPLASTY (Right) Patient reports pain as well-controlled.   Patient is well, and has had no acute complaints or problems. INR was 1.1 this AM. Plan is to go Home after hospital stay. Negative for chest pain and shortness of breath Fever: no Gastrointestinal: negative for nausea and vomiting.  Patient has not had a bowel movement.  Objective: Vital signs in last 24 hours: Temp:  [97.3 F (36.3 C)-98.3 F (36.8 C)] 98.3 F (36.8 C) (12/07 0734) Pulse Rate:  [63-102] 66 (12/07 0734) Resp:  [13-20] 17 (12/07 0734) BP: (113-139)/(49-79) 137/64 (12/07 0734) SpO2:  [94 %-100 %] 97 % (12/07 0734)  Intake/Output from previous day:  Intake/Output Summary (Last 24 hours) at 10/22/2020 0753 Last data filed at 10/22/2020 0552 Gross per 24 hour  Intake 4033.34 ml  Output 3045 ml  Net 988.34 ml    Intake/Output this shift: No intake/output data recorded.  Labs: No results for input(s): HGB in the last 72 hours. No results for input(s): WBC, RBC, HCT, PLT in the last 72 hours. No results for input(s): NA, K, CL, CO2, BUN, CREATININE, GLUCOSE, CALCIUM in the last 72 hours. Recent Labs    10/21/20 0644 10/22/20 0437  INR 1.0 1.1     EXAM General - Patient is Alert, Appropriate and Oriented Extremity - Neurovascular intact Dorsiflexion/Plantar flexion intact Compartment soft Dressing/Incision -clean, Bjorkman, minimal drainage  Motor Function - intact, moving foot and toes well on exam.  Cardiovascular- Regular rate and rhythm, no murmurs/rubs/gallops Respiratory- Lungs clear to auscultation bilaterally Gastrointestinal- soft, nontender and active bowel sounds   Assessment/Plan: 1 Day Post-Op Procedure(s) (LRB): TOTAL HIP ARTHROPLASTY (Right) Active Problems:   H/O total hip arthroplasty  Estimated body mass index is 30.25 kg/m as calculated from the following:   Height as of this encounter: 5\' 10"  (1.778 m).    Weight as of this encounter: 95.6 kg. Advance diet Up with therapy Plan for discharge tomorrow  Maintain posterior hip precautions.  DVT Prophylaxis - Ted hose, foot pumps and Coumadin Weight-Bearing as tolerated to right leg  Cassell Smiles, PA-C [facility] Orthopaedic Surgery 10/22/2020, 7:53 AM

## 2020-10-22 NOTE — Progress Notes (Signed)
Physical Therapy Treatment Patient Details Name: [name] [name] MRN: [id number] DOB: Jun 07, 1946 Today's Date: 10/22/2020    History of Present Illness Pt admitted for R THR. History includes prostate cancer, DDD, and GERD.    PT Comments    Pt is making good progress towards goals with ability to ambulate around RN station safely. Will plan to attempt stairs this date. Good endurance with there-ex. Will continue to progress.   Follow Up Recommendations  Home health PT     Equipment Recommendations  None recommended by PT    Recommendations for Other Services       Precautions / Restrictions Precautions Precautions: Fall;Posterior Hip Precaution Booklet Issued: Yes (comment) Restrictions Weight Bearing Restrictions: Yes RLE Weight Bearing: Weight bearing as tolerated    Mobility  Bed Mobility               General bed mobility comments: received in recliner  Transfers Overall transfer level: Needs assistance Equipment used: Rolling walker (2 wheeled) Transfers: Sit to/from Stand Sit to Stand: Min guard         General transfer comment: cues for maintaining hip precautions. Upright posture noted  Ambulation/Gait Ambulation/Gait assistance: Supervision Gait Distance (Feet): 200 Feet Assistive device: Rolling walker (2 wheeled) Gait Pattern/deviations: Step-through pattern     General Gait Details: ambulated around RN station with step to gait pattern progressing to reciprocal gait pattern, however inconsisten. Fatigues quickly   Marine scientist Rankin (Stroke Patients Only)       Balance Overall balance assessment: Needs assistance Sitting-balance support: No upper extremity supported;Feet supported Sitting balance-Leahy Scale: Good     Standing balance support: During functional activity;Bilateral upper extremity supported Standing balance-Leahy Scale: Good                               Cognition Arousal/Alertness: Awake/alert Behavior During Therapy: WFL for tasks assessed/performed Overall Cognitive Status: Within Functional Limits for tasks assessed                                 General Comments: Pleasant and good safety awareness      Exercises Other Exercises Other Exercises: Seated ther-ex performed on R LE including AP, quad sets, glut sets, hip add squeezes, SLRs, heel slides, LAQ, and hip abd/add. All ther-ex performed x 12 reps. Gave and reviewed written HEP    General Comments        Pertinent Vitals/Pain Pain Assessment: 0-10 Pain Score: 5  Pain Location: R hip Pain Descriptors / Indicators: Operative site guarding Pain Intervention(s): Limited activity within patient's tolerance;Premedicated before session    Home Living                      Prior Function            PT Goals (current goals can now be found in the care plan section) Acute Rehab PT Goals Patient Stated Goal: to go home PT Goal Formulation: With patient Time For Goal Achievement: 11/04/20 Potential to Achieve Goals: Good Progress towards PT goals: Progressing toward goals    Frequency    BID      PT Plan Current plan remains appropriate    Co-evaluation  AM-PAC PT "6 Clicks" Mobility   Outcome Measure  Help needed turning from your back to your side while in a flat bed without using bedrails?: A Little Help needed moving from lying on your back to sitting on the side of a flat bed without using bedrails?: A Little Help needed moving to and from a bed to a chair (including a wheelchair)?: A Little Help needed standing up from a chair using your arms (e.g., wheelchair or bedside chair)?: A Little Help needed to walk in hospital room?: A Little Help needed climbing 3-5 steps with a railing? : A Little 6 Click Score: 18    End of Session Equipment Utilized During Treatment: Gait belt Activity Tolerance: Patient  tolerated treatment well Patient left: in bed;with bed alarm set;with SCD's reapplied Nurse Communication: Mobility status PT Visit Diagnosis: Muscle weakness (generalized) (M62.81);Difficulty in walking, not elsewhere classified (R26.2);Pain Pain - Right/Left: Right Pain - part of body: Hip     Time: 1010-1033 PT Time Calculation (min) (ACUTE ONLY): 23 min  Charges:  $Gait Training: 8-22 mins $Therapeutic Exercise: 8-22 mins                     Greggory Stallion, PT, DPT [phone]    Marte Celani 10/22/2020, 1:06 PM

## 2020-10-22 NOTE — Discharge Summary (Signed)
Physician Discharge Summary  Patient ID: [name] [name] MRN: [id number] DOB/AGE: Aug 15, 1946 74 y.o.  Admit date: 10/21/2020 Discharge date: 10/23/2020  Admission Diagnoses:  H/O total hip arthroplasty [Z96.649]  Surgeries:Procedure(s): Right total hip arthroplasty  SURGEON:  Marciano Sequin. M.D.  ASSISTANT: Vance Peper, PA (present and scrubbed throughout the case, critical for assistance with exposure, retraction, instrumentation, and closure)  ANESTHESIA: spinal  ESTIMATED BLOOD LOSS: 100 mL  FLUIDS REPLACED: 1700 mL of crystalloid  DRAINS: 2 medium Hemovac drains  IMPLANTS UTILIZED: DePuy 15 mm large stature AML femoral stem, 60 mm OD Pinnacle 100 acetabular component, neutral Pinnacle Marathon polyethylene insert, and a 36 mm M-SPEC +1.5 mm hip ball  Discharge Diagnoses: Patient Active Problem List   Diagnosis Date Noted  . 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/21/2020  . Anemia 07/02/2020  . Essential hypertension 07/02/2020  . History of DVT (deep vein thrombosis) 07/26/2019  . Hyperlipidemia 07/26/2019  . 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 Rectal bleeding 06/30/2019  . Avascular necrosis of bone of right hip ([place]) 09/30/2017  . History of prostate cancer 11/26/2016  . Cancer of prostate with intermediate recurrence risk (stage T2b-c or Gleason 7 or PSA 10-20) ([place]) 11/24/2016  . 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 Arthritis   . 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 DVT (deep venous thrombosis) ([place])   . GERD (gastroesophageal reflux disease)   . Hyperlipemia   . Hypertension   . 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 Prostate cancer ([place]) 2018   Treated with radiation  . Rectal bleeding    2013, 2020       Consultants (if any):   Discharged Condition: Improved  Hospital Course: [name] [name] is an 74 y.o. male who was admitted 10/21/2020 with a diagnosis of right hip  osteoarthritis and went to the operating room on 10/21/2020 and underwent right total hip arthroplasty through posterior approach. The patient received perioperative antibiotics for prophylaxis (see below). The patient tolerated the procedure well and was transported to PACU in stable condition. After meeting PACU criteria, the patient was subsequently transferred to the Orthopaedics/Rehabilitation unit.   The patient received DVT prophylaxis in the form of early mobilization, Coumadin, Foot Pumps and TED hose. A sacral pad had been placed and heels were elevated off of the bed with rolled towels in order to protect skin integrity. Foley catheter was discontinued on postoperative day #0. Wound drains were discontinued on postoperative day #2. The surgical incision was healing well without signs of infection.  Physical therapy was initiated postoperatively for transfers, gait training, and strengthening. Occupational therapy was initiated for activities of daily living and evaluation for assisted devices. Rehabilitation goals were reviewed in detail with the patient. The patient made steady progress with physical therapy and physical therapy recommended discharge to Home.   The patient achieved the preliminary goals of this hospitalization and was felt to be medically and orthopaedically appropriate for discharge.  He was given perioperative antibiotics:  Anti-infectives (From admission, onward)   Start     Dose/Rate Route Frequency Ordered Stop   10/21/20 1400  ceFAZolin (ANCEF) IVPB 2g/100 mL premix        2 g 200 mL/hr over 30 Minutes Intravenous Every 6 hours 10/21/20 1215 10/21/20 2144   10/21/20 0615  ceFAZolin (ANCEF) 2-4 GM/100ML-% IVPB       Note to Pharmacy: Myles Lipps   :  cabinet override      10/21/20 0615 10/21/20 1812   10/21/20 0600  ceFAZolin (ANCEF) IVPB 2g/100 mL premix        2 g 200 mL/hr over 30 Minutes Intravenous On call to O.R. 10/20/20 2238 10/21/20 0745     .  Recent vital signs:  Vitals:   10/22/20 2351 10/23/20 0429  BP: (!) 122/56 (!) 120/59  Pulse: 73 72  Resp: 16 16  Temp: 98.1 F (36.7 C) 97.9 F (36.6 C)  SpO2: 96% 95%    Recent laboratory studies:  Recent Labs    10/22/20 0437 10/23/20 0459  INR 1.1 1.1    Diagnostic Studies: PERIPHERAL VASCULAR CATHETERIZATION  Result Date: 09/23/2020 See op note  NM Myocar Multi W/Spect W/Wall Motion / EF  Result Date: 10/04/2020  The study is normal.  This is a low risk study.  The left ventricular ejection fraction is normal (55-65%).  Blood pressure demonstrated a normal response to exercise.  There was no ST segment deviation noted during stress.    DG Hip Port Unilat With Pelvis 1V Right  Result Date: 10/21/2020 CLINICAL DATA:  Right hip replacement EXAM: DG HIP (WITH OR WITHOUT PELVIS) 1V PORT RIGHT COMPARISON:  None. FINDINGS: Right hip replacement in satisfactory position alignment. Soft tissue drain is present overlying the hip posteriorly. No fracture or complication. IMPRESSION: Satisfactory right hip replacement. Electronically Signed   By: Franchot Gallo M.D.   On: 10/21/2020 10:45    Discharge Medications:   Allergies as of 10/23/2020      Reactions   Codeine Nausea Only   Sulfa Antibiotics Nausea And Vomiting      Medication List    TAKE these medications   celecoxib 200 MG capsule Commonly known as: CELEBREX Take 1 capsule (200 mg total) by mouth 2 (two) times daily.   Cyanocobalamin 2500 MCG Subl Take 2,500 mcg by mouth daily.   OSTEO BI-FLEX REGULAR STRENGTH PO Take 2 tablets by mouth daily.   oxyCODONE 5 MG immediate release tablet Commonly known as: Oxy IR/ROXICODONE Take 1 tablet (5 mg total) by mouth every 4 (four) hours as needed for moderate pain (pain score 4-6).   tamsulosin 0.4 MG Caps capsule Commonly known as: FLOMAX Take 0.4 mg by mouth daily.   traMADol 50 MG tablet Commonly known as: ULTRAM Take 1 tablet (50 mg total) by  mouth every 4 (four) hours as needed for moderate pain.   triamterene-hydrochlorothiazide 37.5-25 MG tablet Commonly known as: MAXZIDE-25 Take 0.5 tablets by mouth daily.   warfarin 4 MG tablet Commonly known as: COUMADIN Take 4 mg by mouth daily.            Durable Medical Equipment  (From admission, onward)         Start     Ordered   10/21/20 1216  DME Walker rolling  Once       Question:  Patient needs a walker to treat with the following condition  Answer:  S/P total hip arthroplasty   10/21/20 1215   10/21/20 1216  DME Bedside commode  Once       Question:  Patient needs a bedside commode to treat with the following condition  Answer:  S/P total hip arthroplasty   10/21/20 1215          Disposition: home with home health PT      Follow-up Information    Dereck Leep, MD On 12/03/2020.   Specialty: Orthopedic Surgery Why: at  9:30am Contact information: [place] [id number] [place], PA-C 10/23/2020, 7:09 AM

## 2020-10-22 NOTE — Evaluation (Signed)
Occupational Therapy Evaluation Patient Details Name: [name] [name] MRN: [id number] DOB: 08/21/1946 Today's Date: 10/22/2020    History of Present Illness Pt admitted for R THR. History includes prostate cancer, DDD, and GERD.   Clinical Impression   [name] [name] was seen for OT evaluation, POD#1 from above surgery. Prior to hospital admission, pt was MOD I using SPC for mobility, requiring assist from wife for LBD 2/2 R hip pain. Pt lives c wife in 2 level home, able to live on main. Pt presents to acute OT demonstrating impaired ADL performance and functional mobility 2/2 decreased LB access, fucntional balance/strength deficits, and decreased activity tolerance. Pt able to recall 1/3 posterior total hip precautions at start of session.   Pt currently requires SBA + RW toilet t/f to elevated toilet height - VCs for posterior hip pcns. SBA + RW grooming standing sink side. MOD A don LBD seated in chair - assist for threading 2/2 post hip pcns. Pt instructed in posterior total hip precautions and how to implement, self care skills, falls prevention strategies, home/routines modifications, DME/AE for LB bathing and dressing tasks, and compression stocking mgt strategies. Pt would benefit from skilled OT to address noted impairments and functional limitations (see below for any additional details) in order to maximize safety and independence while minimizing falls risk and caregiver burden. Upon hospital discharge, recommend HHOT to maximize pt safety and return to functional independence during meaningful occupations of daily life.    Follow Up Recommendations  Home health OT    Equipment Recommendations  None recommended by OT    Recommendations for Other Services       Precautions / Restrictions Precautions Precautions: Fall;Posterior Hip Restrictions Weight Bearing Restrictions: Yes RLE Weight Bearing: Weight bearing as tolerated      Mobility Bed Mobility Overal bed mobility: Modified  Independent    General bed mobility comments: bed elevated to maintain post hip pcns    Transfers Overall transfer level: Needs assistance Equipment used: Rolling walker (2 wheeled) Transfers: Sit to/from Stand Sit to Stand: Min guard    General transfer comment: VCs for technique    Balance Overall balance assessment: Needs assistance Sitting-balance support: No upper extremity supported;Feet supported Sitting balance-Leahy Scale: Good     Standing balance support: Single extremity supported;During functional activity Standing balance-Leahy Scale: Good      ADL either performed or assessed with clinical judgement   ADL Overall ADL's : Needs assistance/impaired       General ADL Comments: SBA + RW toilet t/f to elevated toilet height - VCs for posterior hip pcns. SBA + RW grooming standing sink side. MOD A don LBD seated in chair - assist for threading 2/2 post hip pcns                  Pertinent Vitals/Pain Pain Assessment: Faces Faces Pain Scale: Hurts little more Pain Location: R hip Pain Descriptors / Indicators: Operative site guarding Pain Intervention(s): Limited activity within patient's tolerance;Repositioned     Hand Dominance Right   Extremity/Trunk Assessment Upper Extremity Assessment Upper Extremity Assessment: Overall WFL for tasks assessed   Lower Extremity Assessment Lower Extremity Assessment: Generalized weakness       Communication Communication Communication: No difficulties   Cognition Arousal/Alertness: Awake/alert Behavior During Therapy: WFL for tasks assessed/performed Overall Cognitive Status: Within Functional Limits for tasks assessed        General Comments: Pleasant and good safety awareness   General Comments       Exercises  Exercises: Other exercises Other Exercises Other Exercises: Pt and family educated re: OT role, DME recs, d/c recs, falls prevention, AE recs, functional application of posterior hip pcns,  home/routines modifications Other Exercises: LBD, toileting, grooming, sup>sit, sit<>stand, sitting/standing balance/tolerance, ~40 ft mobility   Shoulder Instructions      Home Living Family/patient expects to be discharged to:: Private residence Living Arrangements: Spouse/significant other Available Help at Discharge: Family Type of Home: House Home Access: Stairs to enter Technical brewer of Steps: 1 Entrance Stairs-Rails: None Home Layout: Multi-level;Able to live on main level with bedroom/bathroom Alternate Level Stairs-Number of Steps: flight   Bathroom Shower/Tub: Tub/shower unit (deep tub)   Bathroom Toilet: Handicapped height     Home Equipment: Environmental consultant - 2 wheels;Walker - 4 wheels;Bedside commode;Cane - single point;Shower seat   Additional Comments: inherited all DME from mother      Prior Functioning/Environment Level of Independence: Independent with assistive device(s)        Comments: was using SPC pretty consistently due to hip pain        OT Problem List: Decreased range of motion;Impaired balance (sitting and/or standing);Decreased activity tolerance      OT Treatment/Interventions: Self-care/ADL training;Therapeutic exercise;Energy conservation;DME and/or AE instruction;Therapeutic activities;Patient/family education;Balance training    OT Goals(Current goals can be found in the care plan section) Acute Rehab OT Goals Patient Stated Goal: to go home OT Goal Formulation: With patient/family Time For Goal Achievement: 11/05/20 Potential to Achieve Goals: Good ADL Goals Pt Will Perform Grooming: Independently;standing Pt Will Perform Lower Body Dressing: with modified independence;with caregiver independent in assisting;with adaptive equipment;sit to/from stand (c LRAD PRN) Pt Will Transfer to Toilet: Independently;ambulating;regular height toilet  OT Frequency: Min 1X/week    AM-PAC OT "6 Clicks" Daily Activity     Outcome Measure Help  from another person eating meals?: None Help from another person taking care of personal grooming?: None Help from another person toileting, which includes using toliet, bedpan, or urinal?: A Little Help from another person bathing (including washing, rinsing, drying)?: A Lot Help from another person to put on and taking off regular upper body clothing?: None Help from another person to put on and taking off regular lower body clothing?: A Lot 6 Click Score: 19   End of Session Equipment Utilized During Treatment: Rolling walker  Activity Tolerance: Patient tolerated treatment well Patient left: in chair;with call bell/phone within reach;with chair alarm set;with nursing/sitter in room;with family/visitor present  OT Visit Diagnosis: Other abnormalities of gait and mobility (R26.89)                Time: 4098-1191 OT Time Calculation (min): 18 min Charges:  OT General Charges $OT Visit: 1 Visit OT Evaluation $OT Eval Low Complexity: 1 Low OT Treatments $Self Care/Home Management : 8-22 mins  Dessie Coma, M.S. OTR/L  10/22/20, 9:07 AM  ascom [phone]

## 2020-10-22 NOTE — TOC Initial Note (Signed)
Transition of Care [facility]) - Initial/Assessment Note    Patient Details  Name: [name] [name] MRN: [id number] Date of Birth: 1946/10/14  Transition of Care [facility]) CM/SW Contact:    [name] Ammons, RN Phone Number: 10/22/2020, 2:25 PM  Clinical Narrative:    RNCM completed patient assessment by phone. Patient reports that he is up getting ready to work with therapy and he is feeling pretty well today. Patient reports that he is from home where he is normally pretty independent. He reports that it will be his plan to return there and that he has already been contacted from [facility] and they will be reaching out once he gets home. He also reports that at this time he does not have any equipment [name].  RNCM reached out to [place] with [facility] and they are ready to see patient.          Expected Discharge Plan: [place] Barriers to Discharge: No Barriers Identified   Patient Goals and CMS Choice        Expected Discharge Plan and Services Expected Discharge Plan: [place]       Living arrangements for the past 2 months: Single Family Home                           HH Arranged: PT, OT [place] Agency: [facility] (formerly [employer]) Date [place]: 10/22/20 Time [place]: 36 Representative spoke with at [place]: [place] Arrangements/Services Living arrangements for the past 2 months: [place] Lives with:: Spouse Patient language and need for interpreter reviewed:: Yes        Need for Family Participation in Patient Care: Yes (Comment) Care giver support system in place?: Yes (comment)   Criminal Activity/Legal Involvement Pertinent to Current Situation/Hospitalization: No - Comment as needed  Activities of Daily Living Home Assistive Devices/Equipment: Other (Comment) ADL Screening (condition at time of admission) Patient's cognitive ability adequate to safely complete daily  activities?: Yes Is the patient deaf or have difficulty hearing?: No Does the patient have difficulty seeing, even when wearing glasses/contacts?: Yes Does the patient have difficulty concentrating, remembering, or making decisions?: Yes Patient able to express need for assistance with ADLs?: Yes Does the patient have difficulty dressing or bathing?: No Independently performs ADLs?: Yes (appropriate for developmental age) Does the patient have difficulty walking or climbing stairs?: No Weakness of Legs: Right Weakness of Arms/Hands: None  Permission Sought/Granted         Permission granted to share info w AGENCY: [facility]        Emotional Assessment       Orientation: : Oriented to Self, Oriented to Place, Oriented to  Time, Oriented to Situation Alcohol / Substance Use: Not Applicable Psych Involvement: No (comment)  Admission diagnosis:  H/O total hip arthroplasty [Z96.649] Patient Active Problem List   Diagnosis Date Noted  . 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/21/2020  . Anemia 07/02/2020  . Essential hypertension 07/02/2020  . History of DVT (deep vein thrombosis) 07/26/2019  . Hyperlipidemia 07/26/2019  . 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 Rectal bleeding 06/30/2019  . Avascular necrosis of bone of right hip ([place]) 09/30/2017  . History of prostate cancer 11/26/2016  . Cancer of prostate with intermediate recurrence risk (stage T2b-c or Gleason 7 or PSA 10-20) ([place]) 11/24/2016  . Phlebitis and thrombophlebitis of deep vessels of lower extremities (  [place]) 03/30/2014   PCP:  [name] Hector, MD Pharmacy:   [id number] [place], [place] [address] [place] [address] Phone: [phone] Fax: [phone]     Social Determinants of Health (SDOH) Interventions    Readmission Risk Interventions No flowsheet data found.

## 2020-10-23 LAB — PROTIME-INR
INR: 1.1 (ref 0.8–1.2)
Prothrombin Time: 14.2 seconds (ref 11.4–15.2)

## 2020-10-23 MED ORDER — TRAMADOL HCL 50 MG PO TABS: 50.0000 mg | ORAL_TABLET | ORAL | 0 refills | Status: DC | PRN

## 2020-10-23 MED ORDER — CELECOXIB 200 MG PO CAPS: 200.0000 mg | ORAL_CAPSULE | Freq: Two times a day (BID) | ORAL | 0 refills | Status: DC

## 2020-10-23 MED ORDER — OXYCODONE HCL 5 MG PO TABS: 5.0000 mg | ORAL_TABLET | ORAL | 0 refills | Status: DC | PRN

## 2020-10-23 NOTE — Progress Notes (Signed)
  Subjective: 2 Days Post-Op Procedure(s) (LRB): TOTAL HIP ARTHROPLASTY (Right) Patient reports pain as well-controlled.   Patient is well, and has had no acute complaints or problems Plan is to go Home after hospital stay. Negative for chest pain and shortness of breath Fever: no Gastrointestinal: negative for nausea and vomiting.  Patient has had a bowel movement.  Objective: Vital signs in last 24 hours: Temp:  [97.8 F (36.6 C)-98.3 F (36.8 C)] 97.9 F (36.6 C) (12/08 0429) Pulse Rate:  [66-73] 72 (12/08 0429) Resp:  [16-20] 16 (12/08 0429) BP: (120-143)/(54-71) 120/59 (12/08 0429) SpO2:  [95 %-97 %] 95 % (12/08 0429)  Intake/Output from previous day:  Intake/Output Summary (Last 24 hours) at 10/23/2020 0704 Last data filed at 10/23/2020 0615 Gross per 24 hour  Intake 480 ml  Output 2410 ml  Net -1930 ml    Intake/Output this shift: No intake/output data recorded.  Labs: No results for input(s): HGB in the last 72 hours. No results for input(s): WBC, RBC, HCT, PLT in the last 72 hours. No results for input(s): NA, K, CL, CO2, BUN, CREATININE, GLUCOSE, CALCIUM in the last 72 hours. Recent Labs    10/22/20 0437 10/23/20 0459  INR 1.1 1.1     EXAM General - Patient is Alert, Appropriate and Oriented Extremity - Neurovascular intact Dorsiflexion/Plantar flexion intact Compartment soft Dressing/Incision -clean, [place], no drainage, Postoperative dressing remains in place., Hemovac in place.  Motor Function - intact, moving foot and toes well on exam.  Cardiovascular- Regular rate and rhythm, no murmurs/rubs/gallops Respiratory- Lungs clear to auscultation bilaterally Gastrointestinal- soft and nontender   Assessment/Plan: 2 Days Post-Op Procedure(s) (LRB): TOTAL HIP ARTHROPLASTY (Right) Active Problems:   H/O total hip arthroplasty  Estimated body mass index is 30.25 kg/m as calculated from the following:   Height as of this encounter: 5\' 10"  (1.778 m).    Weight as of this encounter: 95.6 kg. Advance diet Up with therapy Discharge home with home health  Hemovac removed. Mini compression dressing applied.   DVT Prophylaxis - Ted hose, foot pumps and Coumadin Weight-Bearing as tolerated to right leg  Cassell Smiles, PA-C [facility] Orthopaedic Surgery 10/23/2020, 7:04 AM

## 2020-10-23 NOTE — Progress Notes (Signed)
Physical Therapy Treatment Patient Details Name: [name] [name] MRN: [id number] DOB: 10-10-1946 Today's Date: 10/23/2020    History of Present Illness Pt admitted for R THR. History includes prostate cancer, DDD, and GERD.    PT Comments    Pt was pleasant and motivated to participate during the session and continued to make good progress towards goals.  Pt was steady with transfers and gait with max R hip pain reported as 3/10.  Pt was able to ascend and descend one step with a RW with good stability and carryover of proper sequencing with spouse present for training.  Pt reported no adverse symptoms during the session other than R hip pain.  Pt will benefit from HHPT services upon discharge to safely address deficits listed in patient problem list for decreased caregiver assistance and eventual return to PLOF.     Follow Up Recommendations  Home health PT     Equipment Recommendations  None recommended by PT    Recommendations for Other Services       Precautions / Restrictions Precautions Precautions: Fall;Posterior Hip Precaution Booklet Issued: Yes (comment) Restrictions Weight Bearing Restrictions: Yes RLE Weight Bearing: Weight bearing as tolerated    Mobility  Bed Mobility Overal bed mobility: Modified Independent Bed Mobility: Supine to Sit           General bed mobility comments: Extra time and effort  Transfers Overall transfer level: Needs assistance Equipment used: Rolling walker (2 wheeled) Transfers: Sit to/from Stand Sit to Stand: Supervision         General transfer comment: Min verbal and visual cues for maintaining hip precautions. noted  Ambulation/Gait Ambulation/Gait assistance: Supervision Gait Distance (Feet): 200 Feet Assistive device: Rolling walker (2 wheeled) Gait Pattern/deviations: Step-through pattern;Decreased step length - right;Decreased step length - left;Antalgic Gait velocity: decreased   General Gait Details: Mildly  antalgic gait pattern on the RLE   Stairs Stairs: Yes Stairs assistance: Min guard Stair Management: With walker;Backwards;Forwards;Step to pattern Number of Stairs: 1 General stair comments: Pt able to ascend/descend 1 step with RW x 3 with min verbal and visual cues for sequencing with spouse present for training   Wheelchair Mobility    Modified Rankin (Stroke Patients Only)       Balance Overall balance assessment: Needs assistance Sitting-balance support: No upper extremity supported;Feet supported Sitting balance-Leahy Scale: Good     Standing balance support: During functional activity;Bilateral upper extremity supported;No upper extremity supported Standing balance-Leahy Scale: Good Standing balance comment: Min lean on the RW during ambulation and good stability during static and dynamic standing balance training                            Cognition Arousal/Alertness: Awake/alert Behavior During Therapy: WFL for tasks assessed/performed Overall Cognitive Status: Within Functional Limits for tasks assessed                                        Exercises Other Exercises Other Exercises: Static and dynamic standing balance training with feet apart, together, and semi-tandem with reaching outside BOS Other Exercises: Car transfer sequencing education provided verbally and visually with room chair simulation Other Exercises: 90 deg R turn training with cues for sequencing to prevent CKC R hip IR    General Comments        Pertinent Vitals/Pain Pain Assessment: 0-10 Pain Score:  3  Pain Location: R hip Pain Descriptors / Indicators: Sore Pain Intervention(s): Premedicated before session;Monitored during session;Repositioned    Home Living                      Prior Function            PT Goals (current goals can now be found in the care plan section) Progress towards PT goals: Progressing toward goals     Frequency    BID      PT Plan Current plan remains appropriate    Co-evaluation              AM-PAC PT "6 Clicks" Mobility   Outcome Measure  Help needed turning from your back to your side while in a flat bed without using bedrails?: A Little Help needed moving from lying on your back to sitting on the side of a flat bed without using bedrails?: A Little Help needed moving to and from a bed to a chair (including a wheelchair)?: A Little Help needed standing up from a chair using your arms (e.g., wheelchair or bedside chair)?: A Little Help needed to walk in hospital room?: A Little Help needed climbing 3-5 steps with a railing? : A Little 6 Click Score: 18    End of Session Equipment Utilized During Treatment: Gait belt Activity Tolerance: Patient tolerated treatment well Patient left: in chair;with call bell/phone within reach;with chair alarm set;with SCD's reapplied;with family/visitor present Nurse Communication: Mobility status PT Visit Diagnosis: Muscle weakness (generalized) (M62.81);Difficulty in walking, not elsewhere classified (R26.2);Pain Pain - Right/Left: Right Pain - part of body: Hip     Time: 0911-0950 PT Time Calculation (min) (ACUTE ONLY): 39 min  Charges:  $Gait Training: 8-22 mins $Therapeutic Exercise: 8-22 mins $Therapeutic Activity: 8-22 mins                     D. Scott Raygan Skarda PT, DPT 10/23/20, 10:28 AM

## 2020-10-23 NOTE — Progress Notes (Signed)
Dc instructions given pt verbalizes understanding IV dc'd tolerated well pt taken out via w/c in apparent stable condition

## 2020-10-23 NOTE — Anesthesia Postprocedure Evaluation (Signed)
Anesthesia Post Note  Patient: [name] [name]  Procedure(s) Performed: TOTAL HIP ARTHROPLASTY (Right Hip)  Patient location during evaluation: Nursing Unit Anesthesia Type: Spinal Level of consciousness: awake, oriented and awake and alert Pain management: pain level controlled Vital Signs Assessment: post-procedure vital signs reviewed and stable Respiratory status: spontaneous breathing, nonlabored ventilation and respiratory function stable Postop Assessment: no headache and no backache Anesthetic complications: no   No complications documented.   Last Vitals:  Vitals:   10/22/20 2351 10/23/20 0429  BP: (!) 122/56 (!) 120/59  Pulse: 73 72  Resp: 16 16  Temp: 36.7 C 36.6 C  SpO2: 96% 95%    Last Pain:  Vitals:   10/23/20 0429  TempSrc: Oral  PainSc:                  Johnna Acosta

## 2020-11-04 ENCOUNTER — Other Ambulatory Visit (INDEPENDENT_AMBULATORY_CARE_PROVIDER_SITE_OTHER): Payer: Self-pay | Admitting: Vascular Surgery

## 2020-11-04 DIAGNOSIS — Z86718 Personal history of other venous thrombosis and embolism: Secondary | ICD-10-CM

## 2020-11-04 DIAGNOSIS — Z95828 Presence of other vascular implants and grafts: Secondary | ICD-10-CM

## 2020-11-05 ENCOUNTER — Ambulatory Visit (INDEPENDENT_AMBULATORY_CARE_PROVIDER_SITE_OTHER): Payer: Medicare HMO | Admitting: Nurse Practitioner

## 2020-11-05 ENCOUNTER — Telehealth (INDEPENDENT_AMBULATORY_CARE_PROVIDER_SITE_OTHER): Payer: Self-pay

## 2020-11-05 ENCOUNTER — Ambulatory Visit (INDEPENDENT_AMBULATORY_CARE_PROVIDER_SITE_OTHER): Payer: Medicare HMO

## 2020-11-05 ENCOUNTER — Encounter (INDEPENDENT_AMBULATORY_CARE_PROVIDER_SITE_OTHER): Payer: Self-pay | Admitting: Nurse Practitioner

## 2020-11-05 ENCOUNTER — Other Ambulatory Visit: Payer: Self-pay

## 2020-11-05 VITALS — BP 128/78 | HR 76 | Ht 70.0 in | Wt 205.0 lb

## 2020-11-05 DIAGNOSIS — Z95828 Presence of other vascular implants and grafts: Secondary | ICD-10-CM

## 2020-11-05 DIAGNOSIS — Z86718 Personal history of other venous thrombosis and embolism: Secondary | ICD-10-CM

## 2020-11-05 DIAGNOSIS — I10 Essential (primary) hypertension: Secondary | ICD-10-CM | POA: Diagnosis not present

## 2020-11-05 NOTE — H&P (View-Only) (Signed)
Subjective:    Patient ID: [name] [name], male    DOB: 1946/09/16, 73 y.o.   MRN: [id number] Chief Complaint  Patient presents with  . Follow-up    Post IVC filter insertion  discuss filter removal 6 wk [facility]     The patient presents today following hip surgery.  The patient has a previous history of DVT.  The patient had an IVC filter placed on 09/23/2020 for prevention of DVT.  The patient has done well post hip replacement.  He has been working with physical therapy and is doing well.  He denies any pain or swelling in extremities.  Denies any fever, chills, nausea, vomiting or diarrhea.  Today noninvasive study showed no evidence of DVT or superficial venous thrombosis bilaterally   Review of Systems  Cardiovascular: Negative for leg swelling.  Musculoskeletal: Positive for arthralgias.  All other systems reviewed and are negative.      Objective:   Physical Exam Vitals reviewed.  HENT:     Head: Normocephalic.  Cardiovascular:     Rate and Rhythm: Normal rate.  Pulmonary:     Effort: Pulmonary effort is normal.  Neurological:     Mental Status: He is alert and oriented to person, place, and time.     Gait: Gait abnormal.  Psychiatric:        Mood and Affect: Mood normal.        Behavior: Behavior normal.        Thought Content: Thought content normal.        Judgment: Judgment normal.     BP 128/78   Pulse 76   Ht 5\' 10"  (1.778 m)   Wt 205 lb (93 kg)   BMI 29.41 kg/m   Past Medical History:  Diagnosis Date  . Arthritis   . AVN of femur (HCC)    RIGHT  . DVT (deep venous thrombosis) (HCC)   . GERD (gastroesophageal reflux disease)   . Hyperlipemia   . Hypertension   . 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 Prostate cancer (HCC) 2018   Treated with radiation  . Rectal bleeding    2013, 2020    Social History   Socioeconomic History  . Marital status: Married    Spouse name: Not on file  . Number of children: Not on file  . Years of education: Not on  file  . Highest education level: Not on file  Occupational History  . Not on file  Tobacco Use  . Smoking status: Never Smoker  . Smokeless tobacco: Never Used  Vaping Use  . Vaping Use: Never used  Substance and Sexual Activity  . Alcohol use: Not Currently  . Drug use: Not Currently  . Sexual activity: Not on file  Other Topics Concern  . Not on file  Social History Narrative  . Not on file   Social Determinants of Health   Financial Resource Strain: Not on file  Food Insecurity: Not on file  Transportation Needs: Not on file  Physical Activity: Not on file  Stress: Not on file  Social Connections: Not on file  Intimate Partner Violence: Not on file    Past Surgical History:  Procedure Laterality Date  . COLONOSCOPY N/A 07/02/2019   Procedure: COLONOSCOPY;  Surgeon: Toney Reil, MD;  Location: [facility] ENDOSCOPY;  Service: Gastroenterology;  Laterality: N/A;  . ESOPHAGOGASTRODUODENOSCOPY N/A 07/02/2019   Procedure: ESOPHAGOGASTRODUODENOSCOPY (EGD);  Surgeon: Toney Reil, MD;  Location: [facility] ENDOSCOPY;  Service: Gastroenterology;  Laterality: N/A;  . FOOT FRACTURE SURGERY Right 2012   Heel fracture repair/ has metal, fell off ladder  . HERNIA REPAIR  5/52005  . IVC FILTER INSERTION N/A 09/23/2020   Procedure: IVC FILTER INSERTION;  Surgeon: Algernon Huxley, MD;  Location: [place] CV LAB;  Service: Cardiovascular;  Laterality: N/A;  . TOTAL HIP ARTHROPLASTY Right 10/21/2020   Procedure: TOTAL HIP ARTHROPLASTY;  Surgeon: Dereck Leep, MD;  Location: [facility] ORS;  Service: Orthopedics;  Laterality: Right;    Family History  Problem Relation Age of Onset  . Heart attack Father     Allergies  Allergen Reactions  . Codeine Nausea Only  . Sulfa Antibiotics Nausea And Vomiting    CBC Latest Ref Rng & Units 09/20/2020 07/02/2019 07/01/2019  WBC 4.0 - 10.5 K/uL 5.0 7.0 -  Hemoglobin 13.0 - 17.0 g/dL 14.2 11.2(L) 11.4(L)  Hematocrit 39.0 - 52.0 % 42.1 34.3(L)  34.9(L)  Platelets 150 - 400 K/uL 228 226 -      CMP     Component Value Date/Time   NA 140 09/20/2020 1511   NA 144 10/15/2012 0418   K 3.7 09/20/2020 1511   K 4.3 10/15/2012 0418   CL 106 09/20/2020 1511   CL 112 (H) 10/15/2012 0418   CO2 27 09/20/2020 1511   CO2 25 10/15/2012 0418   GLUCOSE 100 (H) 09/20/2020 1511   GLUCOSE 91 10/15/2012 0418   BUN 17 09/20/2020 1511   BUN 13 10/15/2012 0418   CREATININE 1.04 09/20/2020 1511   CREATININE 0.97 10/15/2012 0418   CALCIUM 9.2 09/20/2020 1511   CALCIUM 8.5 10/15/2012 0418   PROT 6.9 09/20/2020 1511   PROT 6.0 (L) 10/13/2012 0845   ALBUMIN 4.2 09/20/2020 1511   ALBUMIN 3.5 10/13/2012 0845   AST 19 09/20/2020 1511   AST 20 10/13/2012 0845   ALT 15 09/20/2020 1511   ALT 19 10/13/2012 0845   ALKPHOS 58 09/20/2020 1511   ALKPHOS 64 10/13/2012 0845   BILITOT 0.8 09/20/2020 1511   BILITOT 0.2 10/13/2012 0845   GFRNONAA >60 09/20/2020 1511   GFRNONAA >60 10/15/2012 0418   GFRAA >60 07/02/2019 0554   GFRAA >60 10/15/2012 0418     No results found.     Assessment & Plan:   1. History of deep vein thrombosis (DVT) of lower extremity The patient has underwent orthopedic surgery without issue and is currently doing well with physical therapy.  Noninvasive studies show no evidence of DVT bilaterally.  At this time we will schedule removal of IVC filter.  The risk, benefits and alternatives were discussed with the patient.  Patient agrees to proceed with surgery.  And we will see patient after the procedure.   2. Essential hypertension Continue antihypertensive medications as already ordered, these medications have been reviewed and there are no changes at this time.    Current Outpatient Medications on File Prior to Visit  Medication Sig Dispense Refill  . celecoxib (CELEBREX) 200 MG capsule Take 1 capsule (200 mg total) by mouth 2 (two) times daily. 90 capsule 0  . Cyanocobalamin 2500 MCG SUBL Take 2,500 mcg by mouth  daily.    . Glucosamine-Chondroitin (OSTEO BI-FLEX REGULAR STRENGTH PO) Take 2 tablets by mouth daily.    Marland Kitchen oxyCODONE (OXY IR/ROXICODONE) 5 MG immediate release tablet Take 1 tablet (5 mg total) by mouth every 4 (four) hours as needed for moderate pain (pain score 4-6). 30 tablet 0  . tamsulosin (FLOMAX) 0.4 MG CAPS  capsule Take 0.4 mg by mouth daily.    . traMADol (ULTRAM) 50 MG tablet Take 1 tablet (50 mg total) by mouth every 4 (four) hours as needed for moderate pain. 30 tablet 0  . triamterene-hydrochlorothiazide (MAXZIDE-25) 37.5-25 MG tablet Take 0.5 tablets by mouth daily.     Marland Kitchen warfarin (COUMADIN) 4 MG tablet Take 4 mg by mouth daily.     No current facility-administered medications on file prior to visit.    There are no Patient Instructions on file for this visit. No follow-ups on file.   Kris Hartmann, NP

## 2020-11-05 NOTE — Telephone Encounter (Signed)
Spoke with the patient and he is scheduled with Dr. Lucky Cowboy for IVC filter removal on 11/13/20 with a 8:00 am arrival time to the [facility]. Covid testing on 11/11/20 between 8-1 pm at the [facility]. Pre-procedure instructions were discussed and will be mailed.

## 2020-11-05 NOTE — Progress Notes (Signed)
 Subjective:    Patient ID: [name] [name], male    DOB: 07/18/1946, 74 y.o.   MRN: [id number] Chief Complaint  Patient presents with  . Follow-up    Post IVC filter insertion  discuss filter removal 6 wk [facility]     The patient presents today following hip surgery.  The patient has a previous history of DVT.  The patient had an IVC filter placed on 09/23/2020 for prevention of DVT.  The patient has done well post hip replacement.  He has been working with physical therapy and is doing well.  He denies any pain or swelling in extremities.  Denies any fever, chills, nausea, vomiting or diarrhea.  Today noninvasive study showed no evidence of DVT or superficial venous thrombosis bilaterally   Review of Systems  Cardiovascular: Negative for leg swelling.  Musculoskeletal: Positive for arthralgias.  All other systems reviewed and are negative.      Objective:   Physical Exam Vitals reviewed.  HENT:     Head: Normocephalic.  Cardiovascular:     Rate and Rhythm: Normal rate.  Pulmonary:     Effort: Pulmonary effort is normal.  Neurological:     Mental Status: He is alert and oriented to person, place, and time.     Gait: Gait abnormal.  Psychiatric:        Mood and Affect: Mood normal.        Behavior: Behavior normal.        Thought Content: Thought content normal.        Judgment: Judgment normal.     BP 128/78   Pulse 76   Ht 5' 10" (1.778 m)   Wt 205 lb (93 kg)   BMI 29.41 kg/m   Past Medical History:  Diagnosis Date  . Arthritis   . AVN of femur (HCC)    RIGHT  . DVT (deep venous thrombosis) (HCC)   . GERD (gastroesophageal reflux disease)   . Hyperlipemia   . Hypertension   . 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 Prostate cancer (HCC) 2018   Treated with radiation  . Rectal bleeding    2013, 2020    Social History   Socioeconomic History  . Marital status: Married    Spouse name: Not on file  . Number of children: Not on file  . Years of education: Not on  file  . Highest education level: Not on file  Occupational History  . Not on file  Tobacco Use  . Smoking status: Never Smoker  . Smokeless tobacco: Never Used  Vaping Use  . Vaping Use: Never used  Substance and Sexual Activity  . Alcohol use: Not Currently  . Drug use: Not Currently  . Sexual activity: Not on file  Other Topics Concern  . Not on file  Social History Narrative  . Not on file   Social Determinants of Health   Financial Resource Strain: Not on file  Food Insecurity: Not on file  Transportation Needs: Not on file  Physical Activity: Not on file  Stress: Not on file  Social Connections: Not on file  Intimate Partner Violence: Not on file    Past Surgical History:  Procedure Laterality Date  . COLONOSCOPY N/A 07/02/2019   Procedure: COLONOSCOPY;  Surgeon: Vanga, Rohini Reddy, MD;  Location: [facility] ENDOSCOPY;  Service: Gastroenterology;  Laterality: N/A;  . ESOPHAGOGASTRODUODENOSCOPY N/A 07/02/2019   Procedure: ESOPHAGOGASTRODUODENOSCOPY (EGD);  Surgeon: Vanga, Rohini Reddy, MD;  Location: [facility] ENDOSCOPY;  Service: Gastroenterology;    Laterality: N/A;  . FOOT FRACTURE SURGERY Right 2012   Heel fracture repair/ has metal, fell off ladder  . HERNIA REPAIR  5/52005  . IVC FILTER INSERTION N/A 09/23/2020   Procedure: IVC FILTER INSERTION;  Surgeon: Algernon Huxley, MD;  Location: [place] CV LAB;  Service: Cardiovascular;  Laterality: N/A;  . TOTAL HIP ARTHROPLASTY Right 10/21/2020   Procedure: TOTAL HIP ARTHROPLASTY;  Surgeon: Dereck Leep, MD;  Location: [facility] ORS;  Service: Orthopedics;  Laterality: Right;    Family History  Problem Relation Age of Onset  . Heart attack Father     Allergies  Allergen Reactions  . Codeine Nausea Only  . Sulfa Antibiotics Nausea And Vomiting    CBC Latest Ref Rng & Units 09/20/2020 07/02/2019 07/01/2019  WBC 4.0 - 10.5 K/uL 5.0 7.0 -  Hemoglobin 13.0 - 17.0 g/dL 14.2 11.2(L) 11.4(L)  Hematocrit 39.0 - 52.0 % 42.1 34.3(L)  34.9(L)  Platelets 150 - 400 K/uL 228 226 -      CMP     Component Value Date/Time   NA 140 09/20/2020 1511   NA 144 10/15/2012 0418   K 3.7 09/20/2020 1511   K 4.3 10/15/2012 0418   CL 106 09/20/2020 1511   CL 112 (H) 10/15/2012 0418   CO2 27 09/20/2020 1511   CO2 25 10/15/2012 0418   GLUCOSE 100 (H) 09/20/2020 1511   GLUCOSE 91 10/15/2012 0418   BUN 17 09/20/2020 1511   BUN 13 10/15/2012 0418   CREATININE 1.04 09/20/2020 1511   CREATININE 0.97 10/15/2012 0418   CALCIUM 9.2 09/20/2020 1511   CALCIUM 8.5 10/15/2012 0418   PROT 6.9 09/20/2020 1511   PROT 6.0 (L) 10/13/2012 0845   ALBUMIN 4.2 09/20/2020 1511   ALBUMIN 3.5 10/13/2012 0845   AST 19 09/20/2020 1511   AST 20 10/13/2012 0845   ALT 15 09/20/2020 1511   ALT 19 10/13/2012 0845   ALKPHOS 58 09/20/2020 1511   ALKPHOS 64 10/13/2012 0845   BILITOT 0.8 09/20/2020 1511   BILITOT 0.2 10/13/2012 0845   GFRNONAA >60 09/20/2020 1511   GFRNONAA >60 10/15/2012 0418   GFRAA >60 07/02/2019 0554   GFRAA >60 10/15/2012 0418     No results found.     Assessment & Plan:   1. History of deep vein thrombosis (DVT) of lower extremity The patient has underwent orthopedic surgery without issue and is currently doing well with physical therapy.  Noninvasive studies show no evidence of DVT bilaterally.  At this time we will schedule removal of IVC filter.  The risk, benefits and alternatives were discussed with the patient.  Patient agrees to proceed with surgery.  And we will see patient after the procedure.   2. Essential hypertension Continue antihypertensive medications as already ordered, these medications have been reviewed and there are no changes at this time.    Current Outpatient Medications on File Prior to Visit  Medication Sig Dispense Refill  . celecoxib (CELEBREX) 200 MG capsule Take 1 capsule (200 mg total) by mouth 2 (two) times daily. 90 capsule 0  . Cyanocobalamin 2500 MCG SUBL Take 2,500 mcg by mouth  daily.    . Glucosamine-Chondroitin (OSTEO BI-FLEX REGULAR STRENGTH PO) Take 2 tablets by mouth daily.    Marland Kitchen oxyCODONE (OXY IR/ROXICODONE) 5 MG immediate release tablet Take 1 tablet (5 mg total) by mouth every 4 (four) hours as needed for moderate pain (pain score 4-6). 30 tablet 0  . tamsulosin (FLOMAX) 0.4 MG CAPS  capsule Take 0.4 mg by mouth daily.    . traMADol (ULTRAM) 50 MG tablet Take 1 tablet (50 mg total) by mouth every 4 (four) hours as needed for moderate pain. 30 tablet 0  . triamterene-hydrochlorothiazide (MAXZIDE-25) 37.5-25 MG tablet Take 0.5 tablets by mouth daily.     Marland Kitchen warfarin (COUMADIN) 4 MG tablet Take 4 mg by mouth daily.     No current facility-administered medications on file prior to visit.    There are no Patient Instructions on file for this visit. No follow-ups on file.   Kris Hartmann, NP

## 2020-11-11 ENCOUNTER — Other Ambulatory Visit
Admission: RE | Admit: 2020-11-11 | Discharge: 2020-11-11 | Disposition: A | Discharge status: Home / Self Care | Payer: Medicare HMO | Source: Ambulatory Visit | Attending: Vascular Surgery | Admitting: Vascular Surgery

## 2020-11-11 ENCOUNTER — Other Ambulatory Visit: Payer: Self-pay

## 2020-11-11 DIAGNOSIS — Z20822 Contact with and (suspected) exposure to covid-19: Secondary | ICD-10-CM | POA: Diagnosis not present

## 2020-11-11 DIAGNOSIS — Z01812 Encounter for preprocedural laboratory examination: Secondary | ICD-10-CM | POA: Insufficient documentation

## 2020-11-11 LAB — SARS CORONAVIRUS 2 (TAT 6-24 HRS): SARS Coronavirus 2: NEGATIVE

## 2020-11-13 ENCOUNTER — Encounter
Admission: RE | Disposition: A | Discharge status: Home / Self Care | Payer: Self-pay | Source: Home / Self Care | Attending: Vascular Surgery

## 2020-11-13 ENCOUNTER — Ambulatory Visit
Admission: RE | Admit: 2020-11-13 | Discharge: 2020-11-13 | Disposition: A | Discharge status: Home / Self Care | Payer: Medicare HMO | Attending: Vascular Surgery | Admitting: Vascular Surgery

## 2020-11-13 ENCOUNTER — Encounter: Payer: Self-pay | Admitting: Vascular Surgery

## 2020-11-13 ENCOUNTER — Other Ambulatory Visit (INDEPENDENT_AMBULATORY_CARE_PROVIDER_SITE_OTHER): Payer: Self-pay | Admitting: Nurse Practitioner

## 2020-11-13 ENCOUNTER — Other Ambulatory Visit: Payer: Self-pay

## 2020-11-13 DIAGNOSIS — Z885 Allergy status to narcotic agent status: Secondary | ICD-10-CM | POA: Diagnosis not present

## 2020-11-13 DIAGNOSIS — Z882 Allergy status to sulfonamides status: Secondary | ICD-10-CM | POA: Diagnosis not present

## 2020-11-13 DIAGNOSIS — Z79899 Other long term (current) drug therapy: Secondary | ICD-10-CM | POA: Insufficient documentation

## 2020-11-13 DIAGNOSIS — Z7901 Long term (current) use of anticoagulants: Secondary | ICD-10-CM | POA: Insufficient documentation

## 2020-11-13 DIAGNOSIS — Z86718 Personal history of other venous thrombosis and embolism: Secondary | ICD-10-CM | POA: Diagnosis not present

## 2020-11-13 DIAGNOSIS — I10 Essential (primary) hypertension: Secondary | ICD-10-CM | POA: Insufficient documentation

## 2020-11-13 DIAGNOSIS — Z299 Encounter for prophylactic measures, unspecified: Secondary | ICD-10-CM

## 2020-11-13 DIAGNOSIS — Z452 Encounter for adjustment and management of vascular access device: Secondary | ICD-10-CM | POA: Diagnosis not present

## 2020-11-13 HISTORY — PX: IVC FILTER REMOVAL: CATH118246

## 2020-11-13 SURGERY — IVC FILTER REMOVAL
Anesthesia: Moderate Sedation

## 2020-11-13 MED ORDER — ONDANSETRON HCL 4 MG/2ML IJ SOLN: 4.0000 mg | Freq: Four times a day (QID) | INTRAMUSCULAR | Status: DC | PRN

## 2020-11-13 MED ORDER — CEFAZOLIN SODIUM-DEXTROSE 2-4 GM/100ML-% IV SOLN: 2.0000 g | Freq: Once | INTRAVENOUS | Status: AC

## 2020-11-13 MED ORDER — MIDAZOLAM HCL 5 MG/5ML IJ SOLN
INTRAMUSCULAR | Status: AC
  Filled 2020-11-13: qty 5

## 2020-11-13 MED ORDER — CEFAZOLIN SODIUM-DEXTROSE 2-4 GM/100ML-% IV SOLN
INTRAVENOUS | Status: AC
  Administered 2020-11-13: 09:00:00 2 g via INTRAVENOUS
  Filled 2020-11-13: qty 100

## 2020-11-13 MED ORDER — HYDROMORPHONE HCL 1 MG/ML IJ SOLN: 1.0000 mg | Freq: Once | INTRAMUSCULAR | Status: DC | PRN

## 2020-11-13 MED ORDER — SODIUM CHLORIDE 0.9 % IV SOLN: INTRAVENOUS | Status: DC

## 2020-11-13 MED ORDER — DIPHENHYDRAMINE HCL 50 MG/ML IJ SOLN: 50.0000 mg | Freq: Once | INTRAMUSCULAR | Status: DC | PRN

## 2020-11-13 MED ORDER — FAMOTIDINE 20 MG PO TABS: 40.0000 mg | ORAL_TABLET | Freq: Once | ORAL | Status: DC | PRN

## 2020-11-13 MED ORDER — IODIXANOL 320 MG/ML IV SOLN
INTRAVENOUS | Status: DC | PRN
  Administered 2020-11-13: 10:00:00 15 mL via INTRA_ARTERIAL

## 2020-11-13 MED ORDER — MIDAZOLAM HCL 2 MG/2ML IJ SOLN
INTRAMUSCULAR | Status: DC | PRN
  Administered 2020-11-13: 2 mg via INTRAVENOUS

## 2020-11-13 MED ORDER — FENTANYL CITRATE (PF) 100 MCG/2ML IJ SOLN
INTRAMUSCULAR | Status: AC
  Filled 2020-11-13: qty 2

## 2020-11-13 MED ORDER — MIDAZOLAM HCL 2 MG/ML PO SYRP: 8.0000 mg | ORAL_SOLUTION | Freq: Once | ORAL | Status: DC | PRN

## 2020-11-13 MED ORDER — FENTANYL CITRATE (PF) 100 MCG/2ML IJ SOLN
INTRAMUSCULAR | Status: DC | PRN
  Administered 2020-11-13: 50 ug via INTRAVENOUS

## 2020-11-13 MED ORDER — METHYLPREDNISOLONE SODIUM SUCC 125 MG IJ SOLR: 125.0000 mg | Freq: Once | INTRAMUSCULAR | Status: DC | PRN

## 2020-11-13 SURGICAL SUPPLY — 5 items
ENSNARE 18-30 (MISCELLANEOUS) ×2 IMPLANT
KIT SNARE 25MM LOOP 120CM 6F (VASCULAR PRODUCTS) ×2 IMPLANT
PACK ANGIOGRAPHY (CUSTOM PROCEDURE TRAY) ×2 IMPLANT
SHEATH ANSEL 12FRX45CM (SHEATH) ×2 IMPLANT
WIRE GUIDERIGHT .035X150 (WIRE) ×2 IMPLANT

## 2020-11-13 NOTE — Op Note (Signed)
    OPERATIVE NOTE   PROCEDURE: 1. Ultrasound guidance for vascular access to right jugular vein. 2. Catheter placement into IVC from right jugular vein. 3. Inferior venacavogram. 4. Retrieval of IVC filter.  PRE-OPERATIVE DIAGNOSIS: 1. Status post IVC filter for previous DVT  2. Status post hip replacement  POST-OPERATIVE DIAGNOSIS: Same as above  SURGEON: Festus Barren, MD  ASSISTANT(S): Louisa Second, MD  ANESTHESIA: local with Moderate Conscious Sedation for approximately 23 minutes using 2 mg of Versed and 50 mcg of Fentanyl  ESTIMATED BLOOD LOSS: 10 cc  CONTRAST: 15 cc  FLUORO TIME: 6.7 minutes  FINDING(S): 1. Patent IVC  SPECIMEN(S): Filter retrieved and disposed of  INDICATIONS:  Patient is a 74 y.o.male who presents with a previous history of IVC filter placement for history of DVT with a recent hip replacement.  The patient desires removal of the filter to avoid the small lifetime risks of perforation, infection, thrombosis, migration, and stent fracture.  Risks and benefits of removal were discussed and the patient is agreeable to proceed.  The patient understands that the filter may not be able to be retrieved if the top of the filter has embedded in the caval wall.   DESCRIPTION: After obtaining full informed written consent, the patient was brought back to the operating room and placed supine upon the operating table. After obtaining adequate sedation, the patient was prepped and draped in the standard fashion. Moderate conscious sedation was administered during the face to face encounter with the patient throughout the procedure with my supervision of the RN administering medicines and monitoring the patient's vital signs, pulse oximetry, telemetry and mental status throughout from the start of the procedure until the patient was taken to the recovery room.  The right jugular vein was visualized with ultrasound and found to be widely patent. This was accessed  under direct ultrasound guidance with a Seldinger needle and a permanent image was recorded. A J-wire was then placed. After skin nick and dilatation, the retrieval sheath was placed over the wire into the inferior vena cava. Inferior venacavogram was then performed. The IVC was found to be widely patent and the filter was in good location. I then was able to snare the hook on the top of the filter and advanced the sheath over the filter collapsing it and bringing it into the sheath in its entirety. It was then removed through the sheath in its entirety. The sheath was then removed and pressure was held on the neck. The patient tolerated the procedure well and was taken to the recovery room in stable condition.  COMPLICATIONS: None  CONDITION: Stable   Festus Barren 11/13/2020 9:50 AM  This note was created with Dragon Medical transcription system. Any errors in dictation are purely unintentional.

## 2020-11-13 NOTE — Discharge Instructions (Signed)
Moderate Conscious Sedation, Adult, Care After °These instructions provide you with information about caring for yourself after your procedure. Your health care provider may also give you more specific instructions. Your treatment has been planned according to current medical practices, but problems sometimes occur. Call your health care provider if you have any problems or questions after your procedure. °What can I expect after the procedure? °After your procedure, it is common: °· To feel sleepy for several hours. °· To feel clumsy and have poor balance for several hours. °· To have poor judgment for several hours. °· To vomit if you eat too soon. °Follow these instructions at home: °For at least 24 hours after the procedure: ° °· Do not: °? Participate in activities where you could fall or become injured. °? Drive. °? Use heavy machinery. °? Drink alcohol. °? Take sleeping pills or medicines that cause drowsiness. °? Make important decisions or sign legal documents. °? Take care of children on your own. °· Rest. °Eating and drinking °· Follow the diet recommended by your health care provider. °· If you vomit: °? Drink water, juice, or soup when you can drink without vomiting. °? Make sure you have little or no nausea before eating solid foods. °General instructions °· Have a responsible adult stay with you until you are awake and alert. °· Take over-the-counter and prescription medicines only as told by your health care provider. °· If you smoke, do not smoke without supervision. °· Keep all follow-up visits as told by your health care provider. This is important. °Contact a health care provider if: °· You keep feeling nauseous or you keep vomiting. °· You feel light-headed. °· You develop a rash. °· You have a fever. °Get help right away if: °· You have trouble breathing. °This information is not intended to replace advice given to you by your health care provider. Make sure you discuss any questions you have  with your health care provider. °Document Revised: 10/15/2017 Document Reviewed: 02/22/2016 °Elsevier Patient Education © 2020 [employer] °Inferior Vena Cava Filter Removal, Care After °This sheet gives you information about how to care for yourself after your procedure. Your health care provider may also give you more specific instructions. If you have problems or questions, contact your health care provider. °What can I expect after the procedure? °After the procedure, it is common to have: °· Mild pain and bruising around your incision in your neck or groin. °· Fatigue. °Follow these instructions at home: °Incision care ° °· Follow instructions from your health care provider about how to take care of your incision. Make sure you: °? Wash your hands with soap and water before you change your bandage (dressing). If soap and water are not available, use hand sanitizer. °? Change your dressing as told by your health care provider. °· Check your incision area every day for signs of infection. Check for: °? Redness, swelling, or more pain. °? Fluid or blood. °? Warmth. °? Pus or a bad smell. °General instructions °· Take over-the-counter and prescription medicines only as told by your health care provider. °· Do not take baths, swim, or use a hot tub until your health care provider approves. Ask your health care provider if you may take showers. You may only be allowed to take sponge baths. °· Do not drive for 24 hours if you were given a medicine to help you relax (sedative) during your procedure. °· Return to your normal activities as told by your health care provider.   Ask your health care provider what activities are safe for you. °· Keep all follow-up visits as told by your health care provider. This is important. °Contact a health care provider if: °· You have chills or a fever. °· You have redness, swelling, or more pain around your incision. °· Your incision feels warm to the touch. °· You have pus or a bad  smell coming from your incision. °Get help right away if: °· You have blood coming from your incision (active bleeding). °? If you have bleeding from the incision site, lie down, apply pressure to the area with a clean cloth or gauze, and get help right away. °· You have chest pain. °· You have difficulty breathing. °Summary °· Follow instructions from your health care provider about how to take care of your incision. °· Return to your normal activities as told by your health care provider. °· Check your incision area every day for signs of infection. °· Get help right away if you have active bleeding, chest pain, or trouble breathing. °This information is not intended to replace advice given to you by your health care provider. Make sure you discuss any questions you have with your health care provider. °Document Revised: 10/15/2017 Document Reviewed: 05/13/2017 °Elsevier Patient Education © 2020 [employer] Inc[employer] ° °

## 2020-11-13 NOTE — Interval H&P Note (Signed)
History and Physical Interval Note:  11/13/2020 8:05 AM  [name] [name]  has presented today for surgery, with the diagnosis of IVC Filter Removal   DVT prophylaxis.  The various methods of treatment have been discussed with the patient and family. After consideration of risks, benefits and other options for treatment, the patient has consented to  Procedure(s): IVC FILTER REMOVAL (N/A) as a surgical intervention.  The patient's history has been reviewed, patient examined, no change in status, stable for surgery.  I have reviewed the patient's chart and labs.  Questions were answered to the patient's satisfaction.     Festus Barren

## 2020-11-18 ENCOUNTER — Encounter: Payer: Self-pay | Admitting: Vascular Surgery

## 2020-12-11 ENCOUNTER — Ambulatory Visit (INDEPENDENT_AMBULATORY_CARE_PROVIDER_SITE_OTHER): Payer: Medicare HMO | Admitting: Nurse Practitioner

## 2020-12-12 ENCOUNTER — Ambulatory Visit (INDEPENDENT_AMBULATORY_CARE_PROVIDER_SITE_OTHER): Payer: Medicare HMO | Admitting: Nurse Practitioner

## 2020-12-12 ENCOUNTER — Other Ambulatory Visit: Payer: Self-pay

## 2020-12-12 VITALS — BP 138/74 | HR 76 | Ht 70.0 in | Wt 206.0 lb

## 2020-12-12 DIAGNOSIS — I10 Essential (primary) hypertension: Secondary | ICD-10-CM

## 2020-12-12 DIAGNOSIS — E785 Hyperlipidemia, unspecified: Secondary | ICD-10-CM

## 2020-12-12 DIAGNOSIS — Z95828 Presence of other vascular implants and grafts: Secondary | ICD-10-CM

## 2020-12-22 ENCOUNTER — Encounter (INDEPENDENT_AMBULATORY_CARE_PROVIDER_SITE_OTHER): Payer: Self-pay | Admitting: Nurse Practitioner

## 2020-12-22 NOTE — Progress Notes (Signed)
Subjective:    Patient ID: [name] [name], male    DOB: 05-18-1946, 75 y.o.   MRN: [id number] Chief Complaint  Patient presents with  . Follow-up    4 week post IVC Filter  removal    The patient presents today following IVC filter removal on 11/13/2020.  The patient has done well following removal.  He is also doing well following his hip replacement.  He denies any fever, chills, nausea vomiting or diarrhea.  The wound has already healed.   Review of Systems  Musculoskeletal: Positive for arthralgias.  All other systems reviewed and are negative.      Objective:   Physical Exam Vitals reviewed.  HENT:     Head: Normocephalic.  Cardiovascular:     Rate and Rhythm: Normal rate.     Pulses: Normal pulses.  Pulmonary:     Effort: Pulmonary effort is normal.  Neurological:     Mental Status: He is alert and oriented to person, place, and time.  Psychiatric:        Mood and Affect: Mood normal.        Behavior: Behavior normal.        Thought Content: Thought content normal.        Judgment: Judgment normal.     BP 138/74   Pulse 76   Ht 5\' 10"  (1.778 m)   Wt 206 lb (93.4 kg)   BMI 29.56 kg/m   Past Medical History:  Diagnosis Date  . Arthritis   . 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 DVT (deep venous thrombosis) ([place])   . GERD (gastroesophageal reflux disease)   . Hyperlipemia   . Hypertension   . 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 Prostate cancer ([place]) 2018   Treated with radiation  . Rectal bleeding    2013, 2020    Social History   Socioeconomic History  . Marital status: Married    Spouse name: Not on file  . Number of children: Not on file  . Years of education: Not on file  . Highest education level: Not on file  Occupational History  . Not on file  Tobacco Use  . Smoking status: Never Smoker  . Smokeless tobacco: Never Used  Vaping Use  . Vaping Use: Never used  Substance and Sexual Activity  . Alcohol use: Not Currently  . Drug use: Not  Currently  . Sexual activity: Not on file  Other Topics Concern  . Not on file  Social History Narrative  . Not on file   Social Determinants of Health   Financial Resource Strain: Not on file  Food Insecurity: Not on file  Transportation Needs: Not on file  Physical Activity: Not on file  Stress: Not on file  Social Connections: Not on file  Intimate Partner Violence: Not on file    Past Surgical History:  Procedure Laterality Date  . COLONOSCOPY N/A 07/02/2019   Procedure: COLONOSCOPY;  Surgeon: Lin Landsman, MD;  Location: [facility] ENDOSCOPY;  Service: Gastroenterology;  Laterality: N/A;  . ESOPHAGOGASTRODUODENOSCOPY N/A 07/02/2019   Procedure: ESOPHAGOGASTRODUODENOSCOPY (EGD);  Surgeon: Lin Landsman, MD;  Location: [facility] ENDOSCOPY;  Service: Gastroenterology;  Laterality: N/A;  . FOOT FRACTURE SURGERY Right 2012   Heel fracture repair/ has metal, fell off ladder  . HERNIA REPAIR  5/52005  . IVC FILTER INSERTION N/A 09/23/2020   Procedure: IVC FILTER INSERTION;  Surgeon: Algernon Huxley, MD;  Location: [place] CV LAB;  Service: Cardiovascular;  Laterality: N/A;  . IVC FILTER REMOVAL N/A 11/13/2020   Procedure: IVC FILTER REMOVAL;  Surgeon: Algernon Huxley, MD;  Location: [place] CV LAB;  Service: Cardiovascular;  Laterality: N/A;  . TOTAL HIP ARTHROPLASTY Right 10/21/2020   Procedure: TOTAL HIP ARTHROPLASTY;  Surgeon: Dereck Leep, MD;  Location: [facility] ORS;  Service: Orthopedics;  Laterality: Right;    Family History  Problem Relation Age of Onset  . Heart attack Father     Allergies  Allergen Reactions  . Codeine Nausea Only  . Sulfa Antibiotics Nausea And Vomiting    CBC Latest Ref Rng & Units 09/20/2020 07/02/2019 07/01/2019  WBC 4.0 - 10.5 K/uL 5.0 7.0 -  Hemoglobin 13.0 - 17.0 g/dL 14.2 11.2(L) 11.4(L)  Hematocrit 39.0 - 52.0 % 42.1 34.3(L) 34.9(L)  Platelets 150 - 400 K/uL 228 226 -      CMP     Component Value Date/Time   NA 140 09/20/2020  1511   NA 144 10/15/2012 0418   K 3.7 09/20/2020 1511   K 4.3 10/15/2012 0418   CL 106 09/20/2020 1511   CL 112 (H) 10/15/2012 0418   CO2 27 09/20/2020 1511   CO2 25 10/15/2012 0418   GLUCOSE 100 (H) 09/20/2020 1511   GLUCOSE 91 10/15/2012 0418   BUN 17 09/20/2020 1511   BUN 13 10/15/2012 0418   CREATININE 1.04 09/20/2020 1511   CREATININE 0.97 10/15/2012 0418   CALCIUM 9.2 09/20/2020 1511   CALCIUM 8.5 10/15/2012 0418   PROT 6.9 09/20/2020 1511   PROT 6.0 (L) 10/13/2012 0845   ALBUMIN 4.2 09/20/2020 1511   ALBUMIN 3.5 10/13/2012 0845   AST 19 09/20/2020 1511   AST 20 10/13/2012 0845   ALT 15 09/20/2020 1511   ALT 19 10/13/2012 0845   ALKPHOS 58 09/20/2020 1511   ALKPHOS 64 10/13/2012 0845   BILITOT 0.8 09/20/2020 1511   BILITOT 0.2 10/13/2012 0845   GFRNONAA >60 09/20/2020 1511   GFRNONAA >60 10/15/2012 0418   GFRAA >60 07/02/2019 0554   GFRAA >60 10/15/2012 0418     No results found.     Assessment & Plan:   1. S/P insertion of IVC (inferior vena caval) filter The patient is doing well status post removal of IVC filter.  He is also doing well with recovery after his orthopedic surgery.  There is no visible wound from IVC filter removal at this time.  Patient will follow up on an as-needed basis.  2. Essential hypertension Continue antihypertensive medications as already ordered, these medications have been reviewed and there are no changes at this time.   3. Hyperlipidemia, unspecified hyperlipidemia type Continue statin as ordered and reviewed, no changes at this time    Current Outpatient Medications on File Prior to Visit  Medication Sig Dispense Refill  . acetaminophen (TYLENOL) 500 MG tablet Take 1,000 mg by mouth every 6 (six) hours as needed.    . Cyanocobalamin 2500 MCG SUBL Take 2,500 mcg by mouth daily.    . Glucosamine-Chondroit-Vit C-Mn (GLUCOSAMINE 1500 COMPLEX PO) Take by mouth.    . Glucosamine-Chondroitin (OSTEO BI-FLEX REGULAR STRENGTH PO)  Take 2 tablets by mouth daily.    . tamsulosin (FLOMAX) 0.4 MG CAPS capsule Take 0.4 mg by mouth daily.    Marland Kitchen triamterene-hydrochlorothiazide (MAXZIDE-25) 37.5-25 MG tablet Take 0.5 tablets by mouth daily.     . celecoxib (CELEBREX) 200 MG capsule Take 1 capsule (200 mg total) by mouth 2 (two) times daily. (  Patient not taking: Reported on 11/13/2020) 90 capsule 0  . oxyCODONE (OXY IR/ROXICODONE) 5 MG immediate release tablet Take 1 tablet (5 mg total) by mouth every 4 (four) hours as needed for moderate pain (pain score 4-6). (Patient not taking: Reported on 11/13/2020) 30 tablet 0  . traMADol (ULTRAM) 50 MG tablet Take 1 tablet (50 mg total) by mouth every 4 (four) hours as needed for moderate pain. (Patient not taking: Reported on 11/13/2020) 30 tablet 0  . warfarin (COUMADIN) 4 MG tablet Take 4 mg by mouth daily.     No current facility-administered medications on file prior to visit.    There are no Patient Instructions on file for this visit. No follow-ups on file.   Kris Hartmann, NP

## 2021-06-25 ENCOUNTER — Other Ambulatory Visit: Payer: Self-pay | Admitting: Internal Medicine

## 2021-06-25 DIAGNOSIS — Z8546 Personal history of malignant neoplasm of prostate: Secondary | ICD-10-CM

## 2021-06-25 DIAGNOSIS — I10 Essential (primary) hypertension: Secondary | ICD-10-CM

## 2021-06-25 DIAGNOSIS — R1031 Right lower quadrant pain: Secondary | ICD-10-CM

## 2021-07-03 ENCOUNTER — Other Ambulatory Visit: Payer: Self-pay

## 2021-07-03 ENCOUNTER — Ambulatory Visit
Admission: RE | Admit: 2021-07-03 | Discharge: 2021-07-03 | Disposition: A | Discharge status: Home / Self Care | Payer: Medicare HMO | Source: Ambulatory Visit | Attending: Internal Medicine | Admitting: Internal Medicine

## 2021-07-03 DIAGNOSIS — Z8546 Personal history of malignant neoplasm of prostate: Secondary | ICD-10-CM | POA: Insufficient documentation

## 2021-07-03 DIAGNOSIS — R1031 Right lower quadrant pain: Secondary | ICD-10-CM | POA: Diagnosis present

## 2021-07-03 DIAGNOSIS — I10 Essential (primary) hypertension: Secondary | ICD-10-CM | POA: Insufficient documentation

## 2021-07-03 MED ORDER — IOHEXOL 300 MG/ML  SOLN
150.0000 mL | Freq: Once | INTRAMUSCULAR | Status: AC | PRN
  Administered 2021-07-03: 100 mL via INTRAVENOUS

## 2021-07-08 ENCOUNTER — Ambulatory Visit: Payer: Self-pay | Admitting: Surgery

## 2021-07-08 NOTE — H&P (View-Only) (Signed)
Subjective:   CC: Non-recurrent unilateral inguinal hernia without obstruction or gangrene [K40.90]  HPI:  [name] [name] is a 75 y.o. male who was referred by Cheryll Cockayne, MD for evaluation of above. Symptoms were first noted several weeks ago. Pain is dull, intermittent and burning and numbness localized to groin.  Associated with nothing specific, exacerbated by exertion.  Relieved with rest.  Lump is reducible.    Past Medical History:  has a past medical history of Anemia, Basal cell carcinoma, Benign neoplasm of colon, Bilateral foot pain (onset 2006), Cancer of prostate (CMS-HCC) (11/26/2016), DDD (degenerative disc disease), Discomfort of right hip, DVT (deep venous thrombosis) (CMS-HCC), Essential hypertension, GERD (gastroesophageal reflux disease), Heart murmur, unspecified, Hematochezia, History of basal cell carcinoma, History of prostatitis, Hyperlipidemia, Onychia and paronychia of toe, and Rectal bleeding.  Past Surgical History:  Past Surgical History:  Procedure Laterality Date   COLONOSCOPY  01/13/2000   COLONOSCOPY  06/11/2010   repeat 06/11/2020 ([place])   EGD  10/28/2012   No repeat per RTE   FRACTURE SURGERY  11/28/2010   Calcaneal fracture   HERNIA REPAIR Left 03/2004   Left inguinal, Dr. Jacqlyn Larsen   JOINT REPLACEMENT  10/21/2020   Right hip replacement   Lipomas resected     low back   Resection of basal cell  11/2006   Right calcaneal fracture     with prior repair   Right total hip arthroplasty  10/21/2020   Dr Marry Guan   Surgery of heel Right 11/28/2010   TONSILLECTOMY      Family History: family history includes Myocardial Infarction (Heart attack) in his father; Skin cancer in his mother.  Social History:  reports that he quit smoking about 40 years ago. His smoking use included cigarettes. He started smoking about 62 years ago. He has a 22.00 pack-year smoking history. He has never used smokeless tobacco. He reports that he does not drink alcohol and does  not use drugs.  Current Medications: has a current medication list which includes the following prescription(s): acetaminophen, cyanocobalamin (vitamin b-12), glucosamine/chondr su a sod, tamsulosin, triamterene-hydrochlorothiazide, and warfarin.  Allergies:  Allergies as of 07/08/2021 - Reviewed 06/25/2021  Allergen Reaction Noted   Codeine sulfate Nausea 06/28/2014   Sulfa (sulfonamide antibiotics) Nausea 07/02/2014    ROS:  A 15 point review of systems was performed and pertinent positives and negatives noted in HPI   Objective:     BP (!) 150/82   Pulse 75   Ht 172.7 cm ('5\' 8"'$ )   Wt 92.5 kg (204 lb)   BMI 31.02 kg/m   Constitutional :  alert, appears stated age, cooperative and no distress  Lymphatics/Throat:  no asymmetry, masses, or scars  Respiratory:  clear to auscultation bilaterally  Cardiovascular:  regular rate and rhythm  Gastrointestinal: soft, non-tender; bowel sounds normal; no masses,  no organomegaly. inguinal hernia noted.  moderate, reducible, no overlying skin changes and TTP, right  Musculoskeletal: Steady gait and movement  Skin: Cool and moist, visible surgical scars left side  Psychiatric: Normal affect, non-agitated, not confused       LABS:  n/a   RADS: CLINICAL DATA:  RIGHT lower quadrant pelvic and inguinal pain for  several weeks, pain into flank area and as well as into RIGHT  testis, when on trip to [place] and developed pain after boarding a  cruise ship. Past history of prostate cancer with radiation therapy,  RIGHT hip replacement surgery December 2021, hypertension, DVT and  IVC  filter   EXAM:  CT ABDOMEN AND PELVIS WITH CONTRAST   TECHNIQUE:  Multidetector CT imaging of the abdomen and pelvis was performed  using the standard protocol following bolus administration of  intravenous contrast. Sagittal and coronal MPR images reconstructed  from axial data set.   CONTRAST:  138m OMNIPAQUE IOHEXOL 300 MG/ML SOLN IV. Dilute oral   contrast.   COMPARISON:  06/30/2019   FINDINGS:  Lower chest: Minimal linear subsegmental atelectasis in lower lobes  bilaterally   Hepatobiliary: Scattered hepatic cysts predominantly lateral segment  LEFT lobe. Gallbladder and liver otherwise normal appearance.   Pancreas: Normal appearance   Spleen: Normal appearance   Adrenals/Urinary Tract: Nonobstructing 5 mm calculus mid RIGHT  kidney. Adrenal glands, kidneys, visualized ureters, and visualized  bladder normal appearance. Portions of distal RIGHT ureter and  urinary bladder are obscured by beam hardening artifacts in pelvis.   Stomach/Bowel: Appendix not visualized. Sigmoid diverticulosis  without evidence of diverticulitis. Stomach and bowel loops  otherwise normal appearance.   Vascular/Lymphatic: Atherosclerotic calcifications aorta and iliac  arteries without aneurysm. No adenopathy.   Reproductive: Minimally enlarged prostate gland.  Seminal vesicles   Other: No free air or free fluid. RIGHT inguinal hernia containing  fat which extends into scrotum.   Musculoskeletal: Beam hardening artifacts in pelvis secondary to  RIGHT hip prosthesis. Bones demineralized. No acute osseous  findings.   IMPRESSION:  Nonobstructing 5 mm RIGHT renal calculus.   Sigmoid diverticulosis without evidence of diverticulitis.   RIGHT inguinal hernia containing fat which extends into scrotum.   No acute intra-abdominal or intrapelvic abnormalities.   Aortic Atherosclerosis (ICD10-I70.0).    Electronically Signed    By: MLavonia DanaM.D.    On: 07/03/2021 15:35  Assessment:       Non-recurrent unilateral inguinal hernia without obstruction or gangrene [K40.90], RIGHT  Plan:     1. Non-recurrent unilateral inguinal hernia without obstruction or gangrene [K40.90]   Discussed the risk of surgery including recurrence, which can be up to 50% in the case of incisional or complex hernias, possible use of prosthetic materials  (mesh) and the increased risk of mesh infxn if used, bleeding, chronic pain, post-op infxn, post-op SBO or ileus, and possible re-operation to address said risks. The risks of general anesthetic, if used, includes MI, CVA, sudden death or even reaction to anesthetic medications also discussed. Alternatives include continued observation.  Benefits include possible symptom relief, prevention of incarceration, strangulation, enlargement in size over time, and the risk of emergency surgery in the face of strangulation.   Typical post-op recovery time of 3-5 days with 2 weeks of activity restrictions were also discussed.  ED return precautions given for sudden increase in pain, size of hernia with accompanying fever, nausea, and/or vomiting.  The patient verbalized understanding and all questions were answered to the patient's satisfaction.   2. Patient has elected to proceed with surgical treatment. Procedure will be scheduled.  Will attempt rbotic assisted laparoscopic, with chance of conversion to open due to radiation hx.  Pt has hesitation proceeding with open repair initially due to prolonged recovery from contralateral repair in the past.  I explained we can attempt laparoscopic first in the hopes of avoiding what sounds like nerve entrapment issues in the past.    Also has hx of DVT on coumadin.  IVC filter needed to be placed for his recent hip surgery, subsequently filter removed.  Will reach out to primary to see if this needs to be done.

## 2021-07-08 NOTE — H&P (Signed)
Subjective:   CC: Non-recurrent unilateral inguinal hernia without obstruction or gangrene [K40.90]  HPI:  [name] [name] is a 75 y.o. male who was referred by Cheryll Cockayne, MD for evaluation of above. Symptoms were first noted several weeks ago. Pain is dull, intermittent and burning and numbness localized to groin.  Associated with nothing specific, exacerbated by exertion.  Relieved with rest.  Lump is reducible.    Past Medical History:  has a past medical history of Anemia, Basal cell carcinoma, Benign neoplasm of colon, Bilateral foot pain (onset 2006), Cancer of prostate (CMS-HCC) (11/26/2016), DDD (degenerative disc disease), Discomfort of right hip, DVT (deep venous thrombosis) (CMS-HCC), Essential hypertension, GERD (gastroesophageal reflux disease), Heart murmur, unspecified, Hematochezia, History of basal cell carcinoma, History of prostatitis, Hyperlipidemia, Onychia and paronychia of toe, and Rectal bleeding.  Past Surgical History:  Past Surgical History:  Procedure Laterality Date   COLONOSCOPY  01/13/2000   COLONOSCOPY  06/11/2010   repeat 06/11/2020 ([place])   EGD  10/28/2012   No repeat per RTE   FRACTURE SURGERY  11/28/2010   Calcaneal fracture   HERNIA REPAIR Left 03/2004   Left inguinal, Dr. Jacqlyn Larsen   JOINT REPLACEMENT  10/21/2020   Right hip replacement   Lipomas resected     low back   Resection of basal cell  11/2006   Right calcaneal fracture     with prior repair   Right total hip arthroplasty  10/21/2020   Dr Marry Guan   Surgery of heel Right 11/28/2010   TONSILLECTOMY      Family History: family history includes Myocardial Infarction (Heart attack) in his father; Skin cancer in his mother.  Social History:  reports that he quit smoking about 40 years ago. His smoking use included cigarettes. He started smoking about 62 years ago. He has a 22.00 pack-year smoking history. He has never used smokeless tobacco. He reports that he does not drink alcohol and does  not use drugs.  Current Medications: has a current medication list which includes the following prescription(s): acetaminophen, cyanocobalamin (vitamin b-12), glucosamine/chondr su a sod, tamsulosin, triamterene-hydrochlorothiazide, and warfarin.  Allergies:  Allergies as of 07/08/2021 - Reviewed 06/25/2021  Allergen Reaction Noted   Codeine sulfate Nausea 06/28/2014   Sulfa (sulfonamide antibiotics) Nausea 07/02/2014    ROS:  A 15 point review of systems was performed and pertinent positives and negatives noted in HPI   Objective:     BP (!) 150/82   Pulse 75   Ht 172.7 cm ('5\' 8"'$ )   Wt 92.5 kg (204 lb)   BMI 31.02 kg/m   Constitutional :  alert, appears stated age, cooperative and no distress  Lymphatics/Throat:  no asymmetry, masses, or scars  Respiratory:  clear to auscultation bilaterally  Cardiovascular:  regular rate and rhythm  Gastrointestinal: soft, non-tender; bowel sounds normal; no masses,  no organomegaly. inguinal hernia noted.  moderate, reducible, no overlying skin changes and TTP, right  Musculoskeletal: Steady gait and movement  Skin: Cool and moist, visible surgical scars left side  Psychiatric: Normal affect, non-agitated, not confused       LABS:  n/a   RADS: CLINICAL DATA:  RIGHT lower quadrant pelvic and inguinal pain for  several weeks, pain into flank area and as well as into RIGHT  testis, when on trip to [place] and developed pain after boarding a  cruise ship. Past history of prostate cancer with radiation therapy,  RIGHT hip replacement surgery December 2021, hypertension, DVT and  IVC  filter   EXAM:  CT ABDOMEN AND PELVIS WITH CONTRAST   TECHNIQUE:  Multidetector CT imaging of the abdomen and pelvis was performed  using the standard protocol following bolus administration of  intravenous contrast. Sagittal and coronal MPR images reconstructed  from axial data set.   CONTRAST:  141m OMNIPAQUE IOHEXOL 300 MG/ML SOLN IV. Dilute oral   contrast.   COMPARISON:  06/30/2019   FINDINGS:  Lower chest: Minimal linear subsegmental atelectasis in lower lobes  bilaterally   Hepatobiliary: Scattered hepatic cysts predominantly lateral segment  LEFT lobe. Gallbladder and liver otherwise normal appearance.   Pancreas: Normal appearance   Spleen: Normal appearance   Adrenals/Urinary Tract: Nonobstructing 5 mm calculus mid RIGHT  kidney. Adrenal glands, kidneys, visualized ureters, and visualized  bladder normal appearance. Portions of distal RIGHT ureter and  urinary bladder are obscured by beam hardening artifacts in pelvis.   Stomach/Bowel: Appendix not visualized. Sigmoid diverticulosis  without evidence of diverticulitis. Stomach and bowel loops  otherwise normal appearance.   Vascular/Lymphatic: Atherosclerotic calcifications aorta and iliac  arteries without aneurysm. No adenopathy.   Reproductive: Minimally enlarged prostate gland.  Seminal vesicles   Other: No free air or free fluid. RIGHT inguinal hernia containing  fat which extends into scrotum.   Musculoskeletal: Beam hardening artifacts in pelvis secondary to  RIGHT hip prosthesis. Bones demineralized. No acute osseous  findings.   IMPRESSION:  Nonobstructing 5 mm RIGHT renal calculus.   Sigmoid diverticulosis without evidence of diverticulitis.   RIGHT inguinal hernia containing fat which extends into scrotum.   No acute intra-abdominal or intrapelvic abnormalities.   Aortic Atherosclerosis (ICD10-I70.0).    Electronically Signed    By: MLavonia DanaM.D.    On: 07/03/2021 15:35  Assessment:       Non-recurrent unilateral inguinal hernia without obstruction or gangrene [K40.90], RIGHT  Plan:     1. Non-recurrent unilateral inguinal hernia without obstruction or gangrene [K40.90]   Discussed the risk of surgery including recurrence, which can be up to 50% in the case of incisional or complex hernias, possible use of prosthetic materials  (mesh) and the increased risk of mesh infxn if used, bleeding, chronic pain, post-op infxn, post-op SBO or ileus, and possible re-operation to address said risks. The risks of general anesthetic, if used, includes MI, CVA, sudden death or even reaction to anesthetic medications also discussed. Alternatives include continued observation.  Benefits include possible symptom relief, prevention of incarceration, strangulation, enlargement in size over time, and the risk of emergency surgery in the face of strangulation.   Typical post-op recovery time of 3-5 days with 2 weeks of activity restrictions were also discussed.  ED return precautions given for sudden increase in pain, size of hernia with accompanying fever, nausea, and/or vomiting.  The patient verbalized understanding and all questions were answered to the patient's satisfaction.   2. Patient has elected to proceed with surgical treatment. Procedure will be scheduled.  Will attempt rbotic assisted laparoscopic, with chance of conversion to open due to radiation hx.  Pt has hesitation proceeding with open repair initially due to prolonged recovery from contralateral repair in the past.  I explained we can attempt laparoscopic first in the hopes of avoiding what sounds like nerve entrapment issues in the past.    Also has hx of DVT on coumadin.  IVC filter needed to be placed for his recent hip surgery, subsequently filter removed.  Will reach out to primary to see if this needs to be done.

## 2021-07-15 ENCOUNTER — Other Ambulatory Visit: Payer: Self-pay

## 2021-07-15 ENCOUNTER — Encounter
Admission: RE | Admit: 2021-07-15 | Discharge: 2021-07-15 | Disposition: A | Discharge status: Home / Self Care | Payer: Medicare HMO | Source: Ambulatory Visit | Attending: Surgery | Admitting: Surgery

## 2021-07-15 DIAGNOSIS — I10 Essential (primary) hypertension: Secondary | ICD-10-CM | POA: Diagnosis not present

## 2021-07-15 DIAGNOSIS — Z01818 Encounter for other preprocedural examination: Secondary | ICD-10-CM | POA: Diagnosis not present

## 2021-07-15 HISTORY — DX: Other specified postprocedural states: Z98.890

## 2021-07-15 HISTORY — DX: Personal history of urinary calculi: Z87.442

## 2021-07-15 HISTORY — DX: Nausea with vomiting, unspecified: R11.2

## 2021-07-15 HISTORY — DX: Basal cell carcinoma of skin, unspecified: C44.91

## 2021-07-15 LAB — PROTIME-INR
INR: 1.6 — ABNORMAL HIGH (ref 0.8–1.2)
Prothrombin Time: 18.6 seconds — ABNORMAL HIGH (ref 11.4–15.2)

## 2021-07-15 NOTE — Patient Instructions (Addendum)
Your procedure is scheduled on: Friday, September 2 Report to the Registration Desk on the 1st floor of the [facility]. To find out your arrival time, please call [phone] between 1PM - 3PM on: Thursday, September 1  REMEMBER: Instructions that are not followed completely may result in serious medical risk, up to and including death; or upon the discretion of your surgeon and anesthesiologist your surgery may need to be rescheduled.  Do not eat food after midnight the night before surgery.  No gum chewing, lozengers or hard candies.  You may however, drink CLEAR liquids up to 2 hours before you are scheduled to arrive for your surgery. Do not drink anything within 2 hours of your scheduled arrival time.  Clear liquids include: - water  - apple juice without pulp - gatorade (not RED, PURPLE, OR BLUE) - black coffee or tea (Do NOT add milk or creamers to the coffee or tea) Do NOT drink anything that is not on this list.  DO NOT TAKE ANY MEDICATIONS THE MORNING OF SURGERY   Follow recommendations from Cardiologist, Pulmonologist or PCP regarding stopping Coumadin. Per your conversations with Dr. Olin Pia office, stop coumadin 5 days prior to surgery. Last day to take coumadin is Saturday, august 27. Restart per surgeons instruction.  One week prior to surgery: Stop Anti-inflammatories (NSAIDS) such as Advil, Aleve, Ibuprofen, Motrin, Naproxen, Naprosyn and Aspirin based products such as Excedrin, Goodys Powder, BC Powder. Stop ANY OVER THE COUNTER supplements until after surgery. Stop osteo-biflex. You may however, continue to take Tylenol if needed for pain up until the day of surgery.  No Alcohol for 24 hours before or after surgery.  On the morning of surgery brush your teeth with toothpaste and water, you may rinse your mouth with mouthwash if you wish. Do not swallow any toothpaste or mouthwash.  Do not wear jewelry.  Do not wear lotions, powders, or perfumes.   Do not  shave body from the neck down 48 hours prior to surgery just in case you cut yourself which could leave a site for infection.  Also, freshly shaved skin may become irritated if using the CHG soap.  Contact lenses, hearing aids and dentures may not be worn into surgery.  Do not bring valuables to the hospital. [facility] is not responsible for any missing/lost belongings or valuables.   Use CHG Soap as directed on instruction sheet.  Notify your doctor if there is any change in your medical condition (cold, fever, infection).  Wear comfortable clothing (specific to your surgery type) to the hospital.  After surgery, you can help prevent lung complications by doing breathing exercises.  Take deep breaths and cough every 1-2 hours. Your doctor may order a device called an Incentive Spirometer to help you take deep breaths.  If you are being discharged the day of surgery, you will not be allowed to drive home. You will need a responsible adult (18 years or older) to drive you home and stay with you that night.   If you are taking public transportation, you will need to have a responsible adult (18 years or older) with you. Please confirm with your physician that it is acceptable to use public transportation.   Please call the [facility] at [phone] if you have any questions about these instructions.  Surgery Visitation Policy:  Patients undergoing a surgery or procedure may have one family member or support person with them as long as that person is not COVID-19 positive  or experiencing its symptoms.  That person may remain in the waiting area during the procedure.

## 2021-07-15 NOTE — Pre-Procedure Instructions (Signed)
Clearance form faxed to Dr. Caryl Comes requesting information about when the patient can safely stop taking coumadin and whether the patient needs to have an IVC filter placed prior to upcoming hernia surgery. Patient stated that he was informed by Dr. Olin Pia office to stop taking coumadin 5 days prior to surgery so the last time he had taken coumadin was Saturday, August 27. Call from Dr. Ines Bloomer office nurse stated that the patient did not need to have filter placed prior to this surgery.

## 2021-07-17 MED ORDER — FAMOTIDINE 20 MG PO TABS: 20.0000 mg | ORAL_TABLET | Freq: Once | ORAL | Status: AC

## 2021-07-17 MED ORDER — ORAL CARE MOUTH RINSE: 15.0000 mL | Freq: Once | OROMUCOSAL | Status: AC

## 2021-07-17 MED ORDER — LACTATED RINGERS IV SOLN: INTRAVENOUS | Status: DC

## 2021-07-17 MED ORDER — APREPITANT 40 MG PO CAPS: 40.0000 mg | ORAL_CAPSULE | Freq: Once | ORAL | Status: AC

## 2021-07-17 MED ORDER — ACETAMINOPHEN 500 MG PO TABS: 1000.0000 mg | ORAL_TABLET | ORAL | Status: AC

## 2021-07-17 MED ORDER — CEFAZOLIN SODIUM-DEXTROSE 2-4 GM/100ML-% IV SOLN
2.0000 g | INTRAVENOUS | Status: AC
  Administered 2021-07-18: 2 g via INTRAVENOUS

## 2021-07-17 MED ORDER — CHLORHEXIDINE GLUCONATE CLOTH 2 % EX PADS: 6.0000 | MEDICATED_PAD | Freq: Once | CUTANEOUS | Status: DC

## 2021-07-17 MED ORDER — GABAPENTIN 300 MG PO CAPS: 300.0000 mg | ORAL_CAPSULE | ORAL | Status: AC

## 2021-07-17 MED ORDER — CHLORHEXIDINE GLUCONATE 0.12 % MT SOLN: 15.0000 mL | Freq: Once | OROMUCOSAL | Status: AC

## 2021-07-18 ENCOUNTER — Ambulatory Visit: Payer: Medicare HMO | Admitting: Urgent Care

## 2021-07-18 ENCOUNTER — Encounter
Admission: RE | Disposition: A | Discharge status: Home Health | Payer: Self-pay | Source: Home / Self Care | Attending: Surgery

## 2021-07-18 ENCOUNTER — Other Ambulatory Visit: Payer: Self-pay

## 2021-07-18 ENCOUNTER — Ambulatory Visit
Admission: RE | Admit: 2021-07-18 | Discharge: 2021-07-18 | Disposition: A | Discharge status: Home Health | Payer: Medicare HMO | Attending: Surgery | Admitting: Surgery

## 2021-07-18 ENCOUNTER — Encounter: Payer: Self-pay | Admitting: Surgery

## 2021-07-18 DIAGNOSIS — Z96641 Presence of right artificial hip joint: Secondary | ICD-10-CM | POA: Diagnosis not present

## 2021-07-18 DIAGNOSIS — Z882 Allergy status to sulfonamides status: Secondary | ICD-10-CM | POA: Insufficient documentation

## 2021-07-18 DIAGNOSIS — Z86718 Personal history of other venous thrombosis and embolism: Secondary | ICD-10-CM | POA: Insufficient documentation

## 2021-07-18 DIAGNOSIS — Z885 Allergy status to narcotic agent status: Secondary | ICD-10-CM | POA: Insufficient documentation

## 2021-07-18 DIAGNOSIS — K4090 Unilateral inguinal hernia, without obstruction or gangrene, not specified as recurrent: Secondary | ICD-10-CM | POA: Diagnosis not present

## 2021-07-18 DIAGNOSIS — Z7901 Long term (current) use of anticoagulants: Secondary | ICD-10-CM | POA: Diagnosis not present

## 2021-07-18 DIAGNOSIS — Z87891 Personal history of nicotine dependence: Secondary | ICD-10-CM | POA: Insufficient documentation

## 2021-07-18 HISTORY — PX: XI ROBOTIC ASSISTED INGUINAL HERNIA REPAIR WITH MESH: SHX6706

## 2021-07-18 LAB — PROTIME-INR
INR: 1 (ref 0.8–1.2)
Prothrombin Time: 13.6 seconds (ref 11.4–15.2)

## 2021-07-18 SURGERY — REPAIR, HERNIA, INGUINAL, ROBOT-ASSISTED, LAPAROSCOPIC, USING MESH
Anesthesia: General | Site: Groin | Laterality: Right

## 2021-07-18 MED ORDER — CHLORHEXIDINE GLUCONATE 0.12 % MT SOLN
OROMUCOSAL | Status: AC
  Administered 2021-07-18: 15 mL via OROMUCOSAL
  Filled 2021-07-18: qty 15

## 2021-07-18 MED ORDER — FAMOTIDINE 20 MG PO TABS
ORAL_TABLET | ORAL | Status: AC
  Administered 2021-07-18: 20 mg via ORAL
  Filled 2021-07-18: qty 1

## 2021-07-18 MED ORDER — GLYCOPYRROLATE 0.2 MG/ML IJ SOLN
INTRAMUSCULAR | Status: AC
  Filled 2021-07-18: qty 4

## 2021-07-18 MED ORDER — DEXAMETHASONE SODIUM PHOSPHATE 10 MG/ML IJ SOLN
INTRAMUSCULAR | Status: DC | PRN
  Administered 2021-07-18: 10 mg via INTRAVENOUS

## 2021-07-18 MED ORDER — LIDOCAINE HCL (PF) 2 % IJ SOLN
INTRAMUSCULAR | Status: AC
  Filled 2021-07-18: qty 15

## 2021-07-18 MED ORDER — BUPIVACAINE LIPOSOME 1.3 % IJ SUSP
INTRAMUSCULAR | Status: DC | PRN
  Administered 2021-07-18: 20 mL

## 2021-07-18 MED ORDER — ONDANSETRON HCL 4 MG/2ML IJ SOLN
INTRAMUSCULAR | Status: AC
  Filled 2021-07-18: qty 12

## 2021-07-18 MED ORDER — BUPIVACAINE-EPINEPHRINE 0.5% -1:200000 IJ SOLN
INTRAMUSCULAR | Status: DC | PRN
  Administered 2021-07-18: 18 mL

## 2021-07-18 MED ORDER — DEXAMETHASONE SODIUM PHOSPHATE 10 MG/ML IJ SOLN
INTRAMUSCULAR | Status: AC
  Filled 2021-07-18: qty 4

## 2021-07-18 MED ORDER — DOCUSATE SODIUM 100 MG PO CAPS: 100.0000 mg | ORAL_CAPSULE | Freq: Two times a day (BID) | ORAL | 0 refills | Status: AC | PRN

## 2021-07-18 MED ORDER — HYDROMORPHONE HCL 1 MG/ML IJ SOLN
INTRAMUSCULAR | Status: AC
  Filled 2021-07-18: qty 1

## 2021-07-18 MED ORDER — LACTATED RINGERS IV SOLN: INTRAVENOUS | Status: DC | PRN

## 2021-07-18 MED ORDER — PROMETHAZINE HCL 25 MG/ML IJ SOLN: 6.2500 mg | INTRAMUSCULAR | Status: DC | PRN

## 2021-07-18 MED ORDER — PROPOFOL 10 MG/ML IV BOLUS
INTRAVENOUS | Status: AC
  Filled 2021-07-18: qty 20

## 2021-07-18 MED ORDER — BUPIVACAINE-EPINEPHRINE (PF) 0.5% -1:200000 IJ SOLN
INTRAMUSCULAR | Status: AC
  Filled 2021-07-18: qty 30

## 2021-07-18 MED ORDER — FENTANYL CITRATE (PF) 100 MCG/2ML IJ SOLN
25.0000 ug | INTRAMUSCULAR | Status: DC | PRN
  Administered 2021-07-18: 50 ug via INTRAVENOUS

## 2021-07-18 MED ORDER — LIDOCAINE HCL (CARDIAC) PF 100 MG/5ML IV SOSY
PREFILLED_SYRINGE | INTRAVENOUS | Status: DC | PRN
  Administered 2021-07-18: 100 mg via INTRAVENOUS

## 2021-07-18 MED ORDER — GABAPENTIN 300 MG PO CAPS
ORAL_CAPSULE | ORAL | Status: AC
  Administered 2021-07-18: 300 mg via ORAL
  Filled 2021-07-18: qty 1

## 2021-07-18 MED ORDER — FENTANYL CITRATE (PF) 100 MCG/2ML IJ SOLN
INTRAMUSCULAR | Status: DC | PRN
  Administered 2021-07-18: 75 ug via INTRAVENOUS
  Administered 2021-07-18: 25 ug via INTRAVENOUS

## 2021-07-18 MED ORDER — MEPERIDINE HCL 25 MG/ML IJ SOLN: 6.2500 mg | INTRAMUSCULAR | Status: DC | PRN

## 2021-07-18 MED ORDER — OXYCODONE HCL 5 MG PO TABS
5.0000 mg | ORAL_TABLET | Freq: Once | ORAL | Status: AC | PRN
Start: 2021-07-18 — End: 2021-07-18
  Administered 2021-07-18: 5 mg via ORAL

## 2021-07-18 MED ORDER — 0.9 % SODIUM CHLORIDE (POUR BTL) OPTIME
TOPICAL | Status: DC | PRN
  Administered 2021-07-18: 500 mL

## 2021-07-18 MED ORDER — ACETAMINOPHEN 325 MG PO TABS: 650.0000 mg | ORAL_TABLET | Freq: Three times a day (TID) | ORAL | 0 refills | Status: AC | PRN

## 2021-07-18 MED ORDER — PROPOFOL 10 MG/ML IV BOLUS
INTRAVENOUS | Status: DC | PRN
  Administered 2021-07-18: 150 mg via INTRAVENOUS
  Administered 2021-07-18: 25 ug/kg/min via INTRAVENOUS

## 2021-07-18 MED ORDER — TRAMADOL HCL 50 MG PO TABS: 50.0000 mg | ORAL_TABLET | Freq: Four times a day (QID) | ORAL | 0 refills | Status: DC | PRN

## 2021-07-18 MED ORDER — FENTANYL CITRATE (PF) 100 MCG/2ML IJ SOLN
INTRAMUSCULAR | Status: AC
  Filled 2021-07-18: qty 2

## 2021-07-18 MED ORDER — FENTANYL CITRATE (PF) 100 MCG/2ML IJ SOLN
INTRAMUSCULAR | Status: AC
  Administered 2021-07-18: 50 ug via INTRAVENOUS
  Filled 2021-07-18: qty 2

## 2021-07-18 MED ORDER — IBUPROFEN 800 MG PO TABS: 800.0000 mg | ORAL_TABLET | Freq: Three times a day (TID) | ORAL | 0 refills | Status: DC | PRN

## 2021-07-18 MED ORDER — APREPITANT 40 MG PO CAPS
ORAL_CAPSULE | ORAL | Status: AC
  Administered 2021-07-18: 40 mg via ORAL
  Filled 2021-07-18: qty 1

## 2021-07-18 MED ORDER — ONDANSETRON HCL 4 MG/2ML IJ SOLN
INTRAMUSCULAR | Status: DC | PRN
  Administered 2021-07-18 (×2): 4 mg via INTRAVENOUS

## 2021-07-18 MED ORDER — ACETAMINOPHEN 500 MG PO TABS
ORAL_TABLET | ORAL | Status: AC
  Administered 2021-07-18: 1000 mg via ORAL
  Filled 2021-07-18: qty 2

## 2021-07-18 MED ORDER — GLYCOPYRROLATE 0.2 MG/ML IJ SOLN
INTRAMUSCULAR | Status: DC | PRN
  Administered 2021-07-18: .2 mg via INTRAVENOUS

## 2021-07-18 MED ORDER — OXYCODONE HCL 5 MG/5ML PO SOLN: 5.0000 mg | Freq: Once | ORAL | Status: AC | PRN

## 2021-07-18 MED ORDER — BUPIVACAINE LIPOSOME 1.3 % IJ SUSP
INTRAMUSCULAR | Status: AC
  Filled 2021-07-18: qty 20

## 2021-07-18 MED ORDER — CEFAZOLIN SODIUM-DEXTROSE 2-4 GM/100ML-% IV SOLN
INTRAVENOUS | Status: AC
  Filled 2021-07-18: qty 100

## 2021-07-18 MED ORDER — SUGAMMADEX SODIUM 200 MG/2ML IV SOLN
INTRAVENOUS | Status: DC | PRN
  Administered 2021-07-18 (×2): 30 mg via INTRAVENOUS
  Administered 2021-07-18: 40 mg via INTRAVENOUS
  Administered 2021-07-18 (×2): 50 mg via INTRAVENOUS

## 2021-07-18 MED ORDER — OXYCODONE HCL 5 MG PO TABS
ORAL_TABLET | ORAL | Status: AC
  Filled 2021-07-18: qty 1

## 2021-07-18 MED ORDER — ROCURONIUM BROMIDE 100 MG/10ML IV SOLN
INTRAVENOUS | Status: DC | PRN
  Administered 2021-07-18: 50 mg via INTRAVENOUS
  Administered 2021-07-18 (×2): 10 mg via INTRAVENOUS

## 2021-07-18 MED ORDER — ROCURONIUM BROMIDE 10 MG/ML (PF) SYRINGE
PREFILLED_SYRINGE | INTRAVENOUS | Status: AC
  Filled 2021-07-18: qty 20

## 2021-07-18 MED ORDER — HYDROMORPHONE HCL 1 MG/ML IJ SOLN
0.2500 mg | INTRAMUSCULAR | Status: DC | PRN
  Administered 2021-07-18: 0.25 mg via INTRAVENOUS

## 2021-07-18 SURGICAL SUPPLY — 55 items
"PENCIL ELECTRO HAND CTR " (MISCELLANEOUS) ×1 IMPLANT
ADH SKN CLS APL DERMABOND .7 (GAUZE/BANDAGES/DRESSINGS) ×1
APL PRP STRL LF DISP 70% ISPRP (MISCELLANEOUS) ×1
BAG INFUSER PRESSURE 100CC (MISCELLANEOUS) IMPLANT
BLADE SURG SZ11 CARB STEEL (BLADE) ×2 IMPLANT
BNDG GAUZE ELAST 4 BULKY (GAUZE/BANDAGES/DRESSINGS) ×2 IMPLANT
CHLORAPREP W/TINT 26 (MISCELLANEOUS) ×2 IMPLANT
COVER TIP SHEARS 8 DVNC (MISCELLANEOUS) ×1 IMPLANT
COVER TIP SHEARS 8MM DA VINCI (MISCELLANEOUS) ×1
COVER WAND RF STERILE (DRAPES) ×2 IMPLANT
DEFOGGER SCOPE WARMER CLEARIFY (MISCELLANEOUS) ×2 IMPLANT
DERMABOND ADVANCED (GAUZE/BANDAGES/DRESSINGS) ×1
DERMABOND ADVANCED .7 DNX12 (GAUZE/BANDAGES/DRESSINGS) ×1 IMPLANT
DRAPE ARM DVNC X/XI (DISPOSABLE) ×3 IMPLANT
DRAPE COLUMN DVNC XI (DISPOSABLE) ×1 IMPLANT
DRAPE DA VINCI XI ARM (DISPOSABLE) ×3
DRAPE DA VINCI XI COLUMN (DISPOSABLE) ×1
ELECT CAUTERY BLADE 6.4 (BLADE) IMPLANT
ELECT REM PT RETURN 9FT ADLT (ELECTROSURGICAL) ×2
ELECTRODE REM PT RTRN 9FT ADLT (ELECTROSURGICAL) ×1 IMPLANT
GAUZE 4X4 16PLY ~~LOC~~+RFID DBL (SPONGE) ×2 IMPLANT
GLOVE SURG SYN 6.5 ES PF (GLOVE) ×4 IMPLANT
GLOVE SURG SYN 6.5 PF PI (GLOVE) ×2 IMPLANT
GLOVE SURG UNDER POLY LF SZ7 (GLOVE) ×4 IMPLANT
GOWN STRL REUS W/ TWL LRG LVL3 (GOWN DISPOSABLE) ×3 IMPLANT
GOWN STRL REUS W/TWL LRG LVL3 (GOWN DISPOSABLE) ×6
IRRIGATOR SUCT 8 DISP DVNC XI (IRRIGATION / IRRIGATOR) IMPLANT
IRRIGATOR SUCTION 8MM XI DISP (IRRIGATION / IRRIGATOR)
IV NS 1000ML (IV SOLUTION)
IV NS 1000ML BAXH (IV SOLUTION) IMPLANT
LABEL OR SOLS (LABEL) IMPLANT
MANIFOLD NEPTUNE II (INSTRUMENTS) ×2 IMPLANT
MESH 3DMAX 4X6 RT LRG (Mesh General) ×1 IMPLANT
MESH 3DMAX MID 4X6 RT LRG (Mesh General) IMPLANT
NDL INSUFFLATION 14GA 120MM (NEEDLE) ×1 IMPLANT
NEEDLE HYPO 22GX1.5 SAFETY (NEEDLE) ×2 IMPLANT
NEEDLE INSUFFLATION 14GA 120MM (NEEDLE) ×2 IMPLANT
OBTURATOR OPTICAL STANDARD 8MM (TROCAR) ×1
OBTURATOR OPTICAL STND 8 DVNC (TROCAR) ×1
OBTURATOR OPTICALSTD 8 DVNC (TROCAR) ×1 IMPLANT
PACK LAP CHOLECYSTECTOMY (MISCELLANEOUS) ×2 IMPLANT
PENCIL ELECTRO HAND CTR (MISCELLANEOUS) ×2 IMPLANT
SEAL CANN UNIV 5-8 DVNC XI (MISCELLANEOUS) ×3 IMPLANT
SEAL XI 5MM-8MM UNIVERSAL (MISCELLANEOUS) ×3
SET TUBE SMOKE EVAC HIGH FLOW (TUBING) ×2 IMPLANT
SOLUTION ELECTROLUBE (MISCELLANEOUS) ×2 IMPLANT
SUT MNCRL 4-0 (SUTURE) ×2
SUT MNCRL 4-0 27XMFL (SUTURE) ×1
SUT VIC AB 2-0 SH 27 (SUTURE) ×2
SUT VIC AB 2-0 SH 27XBRD (SUTURE) ×1 IMPLANT
SUT VLOC 90 6 CV-15 VIOLET (SUTURE) ×4 IMPLANT
SUTURE MNCRL 4-0 27XMF (SUTURE) ×1 IMPLANT
SYR 30ML LL (SYRINGE) ×2 IMPLANT
TAPE TRANSPORE STRL 2 31045 (GAUZE/BANDAGES/DRESSINGS) ×2 IMPLANT
WATER STERILE IRR 500ML POUR (IV SOLUTION) ×2 IMPLANT

## 2021-07-18 NOTE — Anesthesia Procedure Notes (Signed)
Procedure Name: Intubation Date/Time: 07/18/2021 11:46 AM Performed by: Gayland Curry, CRNA Pre-anesthesia Checklist: Patient identified, Emergency Drugs available, Suction available and Patient being monitored Patient Re-evaluated:Patient Re-evaluated prior to induction Oxygen Delivery Method: Circle system utilized Preoxygenation: Pre-oxygenation with 100% oxygen Induction Type: IV induction Ventilation: Mask ventilation without difficulty and Oral airway inserted - appropriate to patient size Laryngoscope Size: Mac and 3 Grade View: Grade II Tube type: Oral Tube size: 7.0 mm Number of attempts: 1 Placement Confirmation: ETT inserted through vocal cords under direct vision, positive ETCO2 and breath sounds checked- equal and bilateral Secured at: 22 cm Tube secured with: Tape Dental Injury: Teeth and Oropharynx as per pre-operative assessment

## 2021-07-18 NOTE — Op Note (Signed)
Preoperative diagnosis: Right inguinal Hernia, initial reducible  Postoperative diagnosis: Right inguinal Hernia  Procedure: Robotic assisted laparoscopic right inguinal hernia repair with mesh  Anesthesia: General  Surgeon: Dr. Lysle Pearl  Wound Classification: Clean  Specimen: none  Complications: None  Estimated Blood Loss: 78m   Indications:  inguinal hernia. Repair was indicated to avoid complications of incarceration, obstruction and pain, and a prosthetic mesh repair was elected.  See H&P for further details.  Findings: 1. Vas Deferens and cord structures identified and preserved 2. Bard 3D max medium weight mesh used for repair 3. Adequate hemostasis achieved  Description of procedure: The patient was taken to the operating room. A time-out was completed verifying correct patient, procedure, site, positioning, and implant(s) and/or special equipment prior to beginning this procedure.  Area was prepped and draped in the usual sterile fashion. An incision was marked 20 cm above the pubic tubercle, slightly above the umbilicus  Scrotum wrapped in Kerlix roll.  Veress needle inserted at palmer's point.  Saline drop test noted to be positive with gradual increase in pressure after initiation of gas insufflation.  15 mm of pressure was achieved prior to removing the Veress needle and then placing a 8 mm port via the Optiview technique through the supraumbilical site.  Inspection of the area afterwards noted no injury to the surrounding organs during insertion of the needle and the port.  2 port sites were marked 8 cm to the lateral sides of the initial port, and a 8 mm robotic port was placed on the left side, another 8 mm robotic port on the right side under direct supervision.  Local anesthesia  infused to the preplanned incision sites prior to insertion of the port.  The d[place] then brought into the operative field and docked to the ports.  Examination of the  abdominal cavity noted a right inguinal hernia.  A peritoneal flap was created approximately 8cm cephalad to the defect by using scissors with electrocautery.  Dissection was carried down towards the pubic tubercle, developing the myopectineal orifice view.  Laterally the flap was carried towards the ASIS.  Moderate hernia sac and lipoma was noted, which carefully dissected away from the adjacent tissues to be fully reduced out of hernia cavity.  Any bleeding was controlled with combination of electrocautery and manual pressure.    After confirming adequate dissection and the peritoneal reflection completely down and away from the cord structures, a Large Bard 3DMax medium weight mesh was placed within the anterior abdominal wall, secured in place using 2-0 Vicryl on an SH needle immediately above the pubic tubercle.  After noting proper placement of the mesh with the peritoneal reflection deep to it, the previously created peritoneal flap was secured back up to the anterior abdominal wall using running 3-0 V-Lock.  Both needles were then removed out of the abdominal cavity, Xi platform undocked from the ports and removed off of operative field.  exparel infused as ilioinguinal block.  Abdomen then desufflated and ports removed. All the skin incisions were then closed with a subcuticular stitch of Monocryl 4-0. Dermabond was applied. The testis was gently pulled down into its anatomic position in the scrotum.  The patient tolerated the procedure well and was taken to the postanesthesia care unit in stable condition. Sponge and instrument count correct at end of procedure.

## 2021-07-18 NOTE — Discharge Instructions (Addendum)
Hernia repair, Care After This sheet gives you information about how to care for yourself after your procedure. Your health care provider may also give you more specific instructions. If you have problems or questions, contact your health care provider. What can I expect after the procedure? After your procedure, it is common to have the following: Pain in your abdomen, especially in the incision areas. You will be given medicine to control the pain. Tiredness. This is a normal part of the recovery process. Your energy level will return to normal over the next several weeks. Changes in your bowel movements, such as constipation or needing to go more often. Talk with your health care provider about how to manage this. Follow these instructions at home: Medicines RESTART COUMADIN AND LOVENOX 48HRS AFTER SURGERY.  PLEASE CALL DR. KLEIN'S OFFICE ON Monday FOR FURTHER INSTRUCTIONS tylenol and advil as needed for discomfort.  Please alternate between the two every four hours as needed for pain.    Use narcotics, if prescribed, only when tylenol and motrin is not enough to control pain.  325-'650mg'$  every 8hrs to max of '3000mg'$ /24hrs (including the '325mg'$  in every norco dose) for the tylenol.    Advil up to '800mg'$  per dose every 8hrs as needed for pain.   PLEASE RECORD NUMBER OF PILLS TAKEN UNTIL NEXT FOLLOW UP APPT.  THIS WILL HELP DETERMINE HOW READY YOU ARE TO BE RELEASED FROM ANY ACTIVITY RESTRICTIONS Do not drive or use heavy machinery while taking prescription pain medicine. Do not drink alcohol while taking prescription pain medicine.  Incision care    Follow instructions from your health care provider about how to take care of your incision areas. Make sure you: Keep your incisions clean and [place]. Wash your hands with soap and water before and after applying medicine to the areas, and before and after changing your bandage (dressing). If soap and water are not available, use hand sanitizer. Change  your dressing as told by your health care provider. Leave stitches (sutures), skin glue, or adhesive strips in place. These skin closures may need to stay in place for 2 weeks or longer. If adhesive strip edges start to loosen and curl up, you may trim the loose edges. Do not remove adhesive strips completely unless your health care provider tells you to do that. Do not wear tight clothing over the incisions. Tight clothing may rub and irritate the incision areas, which may cause the incisions to open. Do not take baths, swim, or use a hot tub until your health care provider approves. OK TO SHOWER IN 24HRS.   Check your incision area every day for signs of infection. Check for: More redness, swelling, or pain. More fluid or blood. Warmth. Pus or a bad smell. Activity Avoid lifting anything that is heavier than 10 lb (4.5 kg) for 2 weeks or until your health care provider says it is okay. No pushing/pulling greater than 30lbs You may resume normal activities as told by your health care provider. Ask your health care provider what activities are safe for you. Take rest breaks during the day as needed. Eating and drinking Follow instructions from your health care provider about what you can eat after surgery. To prevent or treat constipation while you are taking prescription pain medicine, your health care provider may recommend that you: Drink enough fluid to keep your urine clear or pale yellow. Take over-the-counter or prescription medicines. Eat foods that are high in fiber, such as fresh fruits and vegetables, whole  grains, and beans. Limit foods that are high in fat and processed sugars, such as fried and sweet foods. General instructions Ask your health care provider when you will need an appointment to get your sutures or staples removed. Keep all follow-up visits as told by your health care provider. This is important. Contact a health care provider if: You have more redness, swelling,  or pain around your incisions. You have more fluid or blood coming from the incisions. Your incisions feel warm to the touch. You have pus or a bad smell coming from your incisions or your dressing. You have a fever. You have an incision that breaks open (edges not staying together) after sutures or staples have been removed. Get help right away if: You develop a rash. You have chest pain or difficulty breathing. You have pain or swelling in your legs. You feel light-headed or you faint. Your abdomen swells (becomes distended). You have nausea or vomiting. You have blood in your stool (feces). This information is not intended to replace advice given to you by your health care provider. Make sure you discuss any questions you have with your health care provider. Document Released: 05/22/2005 Document Revised: 07/22/2018 Document Reviewed: 08/03/2016 Elsevier Interactive Patient Education  2019 [place]   The drugs that you were given will stay in your system until tomorrow so for the next 24 hours you should not:  Drive an automobile Make any legal decisions Drink any alcoholic beverage   You may resume regular meals tomorrow.  Today it is better to start with liquids and gradually work up to solid foods.  You may eat anything you prefer, but it is better to start with liquids, then soup and crackers, and gradually work up to solid foods.   Please notify your doctor immediately if you have any unusual bleeding, trouble breathing, redness and pain at the surgery site, drainage, fever, or pain not relieved by medication.    Additional Instructions:   Please contact your physician with any problems or Same Day Surgery at [phone], Monday through Friday 6 am to 4 pm, or [place] at [facility] number at [phone].

## 2021-07-18 NOTE — Transfer of Care (Signed)
Immediate Anesthesia Transfer of Care Note  Patient: [name] [name]  Procedure(s) Performed: XI ROBOTIC ASSISTED INGUINAL HERNIA REPAIR WITH MESH (Right: Groin)  Patient Location: PACU  Anesthesia Type:General  Level of Consciousness: awake, alert , oriented and patient cooperative  Airway & Oxygen Therapy: Patient Spontanous Breathing and Patient connected to face mask oxygen  Post-op Assessment:   Post vital signs: Reviewed and stable  Last Vitals:  Vitals Value Taken Time  BP    Temp    Pulse    Resp    SpO2      Last Pain:  Vitals:   07/18/21 0933  TempSrc: Temporal  PainSc: 1          Complications: No notable events documented.

## 2021-07-18 NOTE — Anesthesia Postprocedure Evaluation (Signed)
Anesthesia Post Note  Patient: Zamon November Vuncannon  Procedure(s) Performed: XI ROBOTIC ASSISTED INGUINAL HERNIA REPAIR WITH MESH (Right: Groin)  Patient location during evaluation: PACU Anesthesia Type: General Level of consciousness: awake and alert and oriented Pain management: pain level controlled Vital Signs Assessment: post-procedure vital signs reviewed and stable Respiratory status: spontaneous breathing, nonlabored ventilation and respiratory function stable Cardiovascular status: blood pressure returned to baseline and stable Postop Assessment: no signs of nausea or vomiting Anesthetic complications: no   No notable events documented.   Last Vitals:  Vitals:   07/18/21 1335 07/18/21 1345  BP: (!) 145/59 138/65  Pulse: 71   Resp: 12 12  Temp:    SpO2: 99% 96%    Last Pain:  Vitals:   07/18/21 1345  TempSrc:   PainSc: 7                  Levester Waldridge

## 2021-07-18 NOTE — Interval H&P Note (Signed)
No change. All questions addressed. INR 1.0 This am

## 2021-07-18 NOTE — Anesthesia Preprocedure Evaluation (Signed)
Anesthesia Evaluation  Patient identified by MRN, date of birth, ID band Patient awake    Reviewed: Allergy & Precautions, NPO status , Patient's Chart, lab work & pertinent test results  History of Anesthesia Complications (+) PONV and history of anesthetic complications  Airway Mallampati: II  TM Distance: >3 FB Neck ROM: Full    Dental no notable dental hx.    Pulmonary neg sleep apnea, neg COPD, former smoker,    breath sounds clear to auscultation- rhonchi (-) wheezing      Cardiovascular hypertension, Pt. 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 left leg No date: GERD (gastroesophageal reflux disease) No date: History of kidney stones No date: Hyperlipemia No date: Hypertension No date: PONV (postoperative nausea and vomiting) 09/23/2020: Presence of IVC filter 2018: Prostate cancer ([place])     Comment:  Treated with radiation No date: Rectal bleeding     Comment:  2013, 2020   Reproductive/Obstetrics                             Anesthesia Physical Anesthesia Plan  ASA: 2  Anesthesia Plan: General   Post-op Pain Management:    Induction: Intravenous  PONV Risk Score and Plan: 2 and Ondansetron, Dexamethasone and Aprepitant  Airway Management  Planned: Oral ETT  Additional Equipment:   Intra-op Plan:   Post-operative Plan: Extubation in OR  Informed Consent: I have reviewed the patients History and Physical, chart, labs and discussed the procedure including the risks, benefits and alternatives for the proposed anesthesia with the patient or authorized representative who has indicated his/her understanding and acceptance.     Dental advisory given  Plan Discussed with: CRNA and Anesthesiologist  Anesthesia Plan Comments:         Anesthesia Quick Evaluation

## 2021-07-19 ENCOUNTER — Encounter: Payer: Self-pay | Admitting: Surgery

## 2021-12-16 ENCOUNTER — Other Ambulatory Visit: Payer: Self-pay | Admitting: Surgery

## 2021-12-16 ENCOUNTER — Other Ambulatory Visit (HOSPITAL_COMMUNITY): Payer: Self-pay | Admitting: Surgery

## 2021-12-16 DIAGNOSIS — R1032 Left lower quadrant pain: Secondary | ICD-10-CM

## 2021-12-22 ENCOUNTER — Other Ambulatory Visit: Payer: Self-pay

## 2021-12-22 ENCOUNTER — Ambulatory Visit
Admission: RE | Admit: 2021-12-22 | Discharge: 2021-12-22 | Disposition: A | Discharge status: Home / Self Care | Payer: Medicare HMO | Source: Ambulatory Visit | Attending: Surgery | Admitting: Surgery

## 2021-12-22 DIAGNOSIS — R1032 Left lower quadrant pain: Secondary | ICD-10-CM | POA: Insufficient documentation

## 2021-12-22 LAB — POCT I-STAT CREATININE: Creatinine, Ser: 1.2 mg/dL (ref 0.61–1.24)

## 2021-12-22 MED ORDER — IOHEXOL 300 MG/ML  SOLN
100.0000 mL | Freq: Once | INTRAMUSCULAR | Status: AC | PRN
  Administered 2021-12-22: 100 mL via INTRAVENOUS

## 2022-01-04 ENCOUNTER — Emergency Department: Payer: Medicare HMO

## 2022-01-04 ENCOUNTER — Emergency Department
Admission: EM | Admit: 2022-01-04 | Discharge: 2022-01-04 | Disposition: A | Discharge status: Home / Self Care | Payer: Medicare HMO | Attending: Emergency Medicine | Admitting: Emergency Medicine

## 2022-01-04 ENCOUNTER — Other Ambulatory Visit: Payer: Self-pay

## 2022-01-04 ENCOUNTER — Encounter: Payer: Self-pay | Admitting: Emergency Medicine

## 2022-01-04 DIAGNOSIS — R102 Pelvic and perineal pain: Secondary | ICD-10-CM | POA: Insufficient documentation

## 2022-01-04 DIAGNOSIS — Z8546 Personal history of malignant neoplasm of prostate: Secondary | ICD-10-CM | POA: Insufficient documentation

## 2022-01-04 DIAGNOSIS — R52 Pain, unspecified: Secondary | ICD-10-CM

## 2022-01-04 DIAGNOSIS — I10 Essential (primary) hypertension: Secondary | ICD-10-CM | POA: Insufficient documentation

## 2022-01-04 DIAGNOSIS — Z96649 Presence of unspecified artificial hip joint: Secondary | ICD-10-CM | POA: Diagnosis not present

## 2022-01-04 LAB — CBC WITH DIFFERENTIAL/PLATELET
Abs Immature Granulocytes: 0.01 10*3/uL (ref 0.00–0.07)
Basophils Absolute: 0.1 10*3/uL (ref 0.0–0.1)
Basophils Relative: 1 %
Eosinophils Absolute: 0 10*3/uL (ref 0.0–0.5)
Eosinophils Relative: 0 %
HCT: 45.7 % (ref 39.0–52.0)
Hemoglobin: 14.8 g/dL (ref 13.0–17.0)
Immature Granulocytes: 0 %
Lymphocytes Relative: 24 %
Lymphs Abs: 1.7 10*3/uL (ref 0.7–4.0)
MCH: 29.7 pg (ref 26.0–34.0)
MCHC: 32.4 g/dL (ref 30.0–36.0)
MCV: 91.6 fL (ref 80.0–100.0)
Monocytes Absolute: 0.3 10*3/uL (ref 0.1–1.0)
Monocytes Relative: 5 %
Neutro Abs: 4.8 10*3/uL (ref 1.7–7.7)
Neutrophils Relative %: 70 %
Platelets: 250 10*3/uL (ref 150–400)
RBC: 4.99 MIL/uL (ref 4.22–5.81)
RDW: 13.4 % (ref 11.5–15.5)
WBC: 7 10*3/uL (ref 4.0–10.5)
nRBC: 0 % (ref 0.0–0.2)

## 2022-01-04 LAB — URINALYSIS, ROUTINE W REFLEX MICROSCOPIC
Bacteria, UA: NONE SEEN
Bilirubin Urine: NEGATIVE
Glucose, UA: NEGATIVE mg/dL
Ketones, ur: 5 mg/dL — AB
Leukocytes,Ua: NEGATIVE
Nitrite: NEGATIVE
Protein, ur: NEGATIVE mg/dL
Specific Gravity, Urine: 1.023 (ref 1.005–1.030)
Squamous Epithelial / HPF: NONE SEEN (ref 0–5)
pH: 5 (ref 5.0–8.0)

## 2022-01-04 LAB — BASIC METABOLIC PANEL
Anion gap: 6 (ref 5–15)
BUN: 18 mg/dL (ref 8–23)
CO2: 31 mmol/L (ref 22–32)
Calcium: 9.7 mg/dL (ref 8.9–10.3)
Chloride: 103 mmol/L (ref 98–111)
Creatinine, Ser: 1.04 mg/dL (ref 0.61–1.24)
GFR, Estimated: >60 mL/min (ref >60)
Glucose, Bld: 125 mg/dL — ABNORMAL HIGH (ref 70–99)
Potassium: 4 mmol/L (ref 3.5–5.1)
Sodium: 140 mmol/L (ref 135–145)

## 2022-01-04 MED ORDER — OXYCODONE-ACETAMINOPHEN 5-325 MG PO TABS
1.0000 | ORAL_TABLET | Freq: Once | ORAL | Status: AC
  Administered 2022-01-04: 1 via ORAL
  Filled 2022-01-04: qty 1

## 2022-01-04 MED ORDER — OXYCODONE HCL 5 MG PO TABS: 5.0000 mg | ORAL_TABLET | Freq: Three times a day (TID) | ORAL | 0 refills | Status: AC | PRN

## 2022-01-04 MED ORDER — LIDOCAINE 5 % EX PTCH: 1.0000 | MEDICATED_PATCH | CUTANEOUS | 0 refills | Status: DC

## 2022-01-04 MED ORDER — LIDOCAINE 5 % EX PTCH
1.0000 | MEDICATED_PATCH | CUTANEOUS | Status: DC
  Administered 2022-01-04: 1 via TRANSDERMAL
  Filled 2022-01-04: qty 1

## 2022-01-04 NOTE — Discharge Instructions (Addendum)
The ultrasound of your testicles was normal.  I suspect that your pain may be related to nerve pain in the groin area.  You can try the Lidoderm patch for pain and take the oxycodone every 6 hours only as needed.  Please be sure not to drive while you are taking this medicine as it can make you sleepy.  Like you to see a urologist.  Please call the number above to schedule an appointment.  You can continue to take the Tylenol as well.

## 2022-01-04 NOTE — ED Triage Notes (Signed)
Pt to ED via POV with c/o left groin pain, he thought that he had a hernia went to the PMD was cleared for a hernia and UTI. He was suppose to go to urology, but got better until today, pain has intensified and is going into his left testicle. No swelling or painful urination.

## 2022-01-04 NOTE — ED Provider Notes (Signed)
[facility] Provider Note    Event Date/Time   First MD Initiated Contact with Patient 01/04/22 1556     (approximate)   History   Groin Pain   HPI  [name] [name] is a 76 y.o. male with past medical history of hypertension, hyperlipidemia, prostate cancer status post radiation who presents with groin pain.  Symptoms have been going on for about the last 2 months.  He endorses a shocklike electric pain in his left inguinal region radiating around to the testicle.  It comes and goes.  Does feel worse after he has been sitting for some time.  Denies pain worse with coughing sneezing or movement.  Denies any testicular swelling.  No dysuria or abnormal discharge.  Has had a hernia repair on that side in the past.  Denies swelling.  He is currently only taking Tylenol for it.  Patient was seen by Dr. Lysle Pearl on 2/6 for evaluation of left groin pain in the setting of having history of an open inguinal hernia repair on that side.  Dr. Nicki Reaper ordered a CT of the pelvis which showed some bladder wall thickening potentially consistent with cystitis but no hernia or other abnormality.  He was seen again by his PCP on 2/10.  He did complete a course of antibiotics for UTI.  Was advised to follow-up with urology as there is a thought that he could be having some pudendal nerve irritation.    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[place]) 09/30/2017   History of prostate cancer 11/26/2016   Cancer of prostate with intermediate recurrence risk (stage T2b-c or Gleason 7 or PSA 10-20) ([place]) 11/24/2016   Phlebitis and thrombophlebitis of deep vessels of lower extremities ([place]) 03/30/2014     Physical Exam  Triage Vital Signs: ED Triage Vitals  Enc Vitals Group     BP 01/04/22 1417 (!) 160/87     Pulse Rate 01/04/22 1417 (!) 103     Resp 01/04/22 1417 20     Temp 01/04/22 1417 98.7 F (37.1 C)     Temp Source 01/04/22 1417 Oral     SpO2 01/04/22 1417 96 %     Weight 01/04/22 1418 210 lb (95.3 kg)     Height 01/04/22 1418 5\' 10"  (1.778 m)     Head Circumference --      Peak Flow --      Pain Score 01/04/22 1417 6     Pain Loc --      Pain Edu? --      Excl. in [place]? --  Most recent vital signs: Vitals:   01/04/22 1417 01/04/22 1619  BP: (!) 160/87 (!) 155/82  Pulse: (!) 103 91  Resp: 20 18  Temp: 98.7 F (37.1 C) 97.8 F (36.6 C)  SpO2: 96% 94%     General: Awake, no distress.  CV:  Good peripheral perfusion.  Resp:  Normal effort.  Abd:  No distention.  Neuro:             Awake, Alert, Oriented x 3  Other:  Unremarkable GU exam, no scrotal tenderness or swelling, no erythema, no masses   ED Results / Procedures / Treatments  Labs (all labs ordered are listed, but only abnormal results are displayed) Labs Reviewed  URINALYSIS, ROUTINE W REFLEX MICROSCOPIC - Abnormal; Notable for the following components:      Result Value   Color, Urine YELLOW (*)    APPearance CLEAR (*)    Hgb urine dipstick SMALL (*)    Ketones, ur 5 (*)    All other components within normal limits  BASIC METABOLIC PANEL - Abnormal; Notable for the following components:   Glucose, Bld 125 (*)    All  other components within normal limits  CBC WITH DIFFERENTIAL/PLATELET     EKG     RADIOLOGY Reviewed the scrotal ultrasound which is negative for torsion   PROCEDURES:  Critical Care performed: No     MEDICATIONS ORDERED IN ED: Medications  lidocaine (LIDODERM) 5 % 1 patch (has no administration in time range)  oxyCODONE-acetaminophen (PERCOCET/ROXICET) 5-325 MG per tablet 1 tablet (has no administration in time range)     IMPRESSION / MDM / ASSESSMENT AND PLAN / ED COURSE  I reviewed the triage vital signs and the nursing notes.                              Differential diagnosis includes, but is not limited to, neuropathic pain, inguinal hernia, epididymitis, orchitis, UTI  Patient is a 76 year old male with history of open left hernia repair on the left who presents with 2 months of sharp electric-like pain in the left groin worsening over the last several days.  Patient has been seen by Dr. Lysle Pearl who did his previous hernia repair on the right as well as his primary doctor.  He has had a CT of the pelvis which did not demonstrate any abnormality other than some bladder wall thickening suggestive of cystitis and completed the course of antibiotics for this.  There was no evidence of hernia on CT.  Patient presents today because of acute worsening.  He has been taking Tylenol for it.  On exam today there is no scrotal tenderness swelling erythema to suggest infection.  Exam is rather unremarkable.  Scrotal ultrasound was obtained given the radiation of the pain to the testicle this does not demonstrate any acute abnormalities including no evidence of torsion or epididymitis.  Suspicion for torsion exceedingly low given his exam and the chronicity of his symptoms.  His recitation to me is most concerning for neuropathic type pain perhaps of the ilioinguinal nerve or genitofemoral nerve.  The shocklike electric pain worse with the hip flexed would seem to suggest that this is  potentially neuropathic.  Upon review of post hernia repair pain it is not uncommon that these neuropathies occur years after the surgery.  I recommended a Lidoderm patch as well as a short course of oxycodone as patient is on warfarin thus we cannot  use NSAIDs.  Ultimately I think he needs to be referred and seen by urology.  There is no indication for further work-up today.  Discussed cautions around opiate use at home.      FINAL CLINICAL IMPRESSION(S) / ED DIAGNOSES   Final diagnoses:  Pain     Rx / DC Orders   ED Discharge Orders          Ordered    lidocaine (LIDODERM) 5 %  Every 24 hours        01/04/22 1616    oxyCODONE (ROXICODONE) 5 MG immediate release tablet  Every 8 hours PRN        01/04/22 1616             Note:  This document was prepared using Dragon voice recognition software and may include unintentional dictation errors.   Rada Hay, MD 01/04/22 1623

## 2022-01-15 ENCOUNTER — Other Ambulatory Visit: Payer: Self-pay

## 2022-01-15 ENCOUNTER — Encounter: Payer: Self-pay | Admitting: Urology

## 2022-01-15 ENCOUNTER — Ambulatory Visit: Payer: Medicare HMO | Admitting: Urology

## 2022-01-15 VITALS — BP 146/73 | HR 78 | Ht 69.0 in | Wt 210.0 lb

## 2022-01-15 DIAGNOSIS — N50812 Left testicular pain: Secondary | ICD-10-CM

## 2022-01-15 DIAGNOSIS — Z8546 Personal history of malignant neoplasm of prostate: Secondary | ICD-10-CM

## 2022-01-15 DIAGNOSIS — N5082 Scrotal pain: Secondary | ICD-10-CM

## 2022-01-15 DIAGNOSIS — R1032 Left lower quadrant pain: Secondary | ICD-10-CM | POA: Diagnosis not present

## 2022-01-15 MED ORDER — GABAPENTIN 600 MG PO TABS: 300.0000 mg | ORAL_TABLET | Freq: Two times a day (BID) | ORAL | 0 refills | Status: DC

## 2022-01-15 NOTE — Progress Notes (Signed)
? ?01/15/2022 ?10:39 AM  ? ?[name] ?[birth year] ?[id number] ? ?Referring provider: Adin Hector, MD ?[facility] ?[place],  [place] [address] ? ?Chief Complaint  ?Patient presents with  ? Testicle Pain  ? ? ?HPI: ?[name] [name] is a 76 y.o. male who presents for follow-up of recent ED visit for scrotal pain. ? ?Onset of left groin pain radiating to left testis December 2022 ?Pain originates in the left inguinal region and is described as a stabbing, electrical shocklike pain.  The pain radiates to the left hemiscrotum and he complains of a raw sensation to his scrotum though has had no skin changes ?He has had a previous right hip replacement and saw Dr. Marry Guan to see if there could be problems with his left hip and states evaluation was negative ?He has a history of an open left inguinal hernia repair by Dr. Jacqlyn Larsen around 2005.  He had a laparoscopic right inguinal hernia repair September 2022 by Dr. Lysle Pearl.  He saw Dr. Lysle Pearl and examination was on remarkable.  A CT of the pelvis was ordered which showed no significant abnormalities.  Minimal haziness of the perivesical fat was commented. ?He underwent a course of empiric antibiotic therapy by Dr. Caryl Comes with no improvement in his symptoms ?He was [facility] at [facility]04[facility]2021 and referred to the ED for further evaluation ?He had a scrotal sonogram performed which was unremarkable ?He does relate to left low back pain and sciatica with no previous evaluation ?He has a history of prostate cancer treated with radiation therapy at [facility] in 2020.  Last PSA 12/2021 was 0.11 ? ? ?PMH: ?Past Medical History:  ?Diagnosis Date  ? Arthritis   ? 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 RIGHT  ? Basal cell carcinoma   ? face, legs  ? DVT (deep venous thrombosis) ([place]) 2005  ? after hernia surgery. left leg  ? GERD (gastroesophageal reflux disease)   ? History of kidney stones   ? Hyperlipemia   ? Hypertension   ? PONV (postoperative nausea and vomiting)   ?  Presence of IVC filter 09/23/2020  ? Prostate cancer ([place]) 2018  ? Treated with radiation  ? Rectal bleeding   ? 2013, 2020  ? ? ?Surgical History: ?Past Surgical History:  ?Procedure Laterality Date  ? COLONOSCOPY N/A 07/02/2019  ? Procedure: COLONOSCOPY;  Surgeon: Lin Landsman, MD;  Location: [facility] ENDOSCOPY;  Service: Gastroenterology;  Laterality: N/A;  ? COLONOSCOPY    ? 2001, 2011  ? ESOPHAGOGASTRODUODENOSCOPY N/A 07/02/2019  ? Procedure: ESOPHAGOGASTRODUODENOSCOPY (EGD);  Surgeon: Lin Landsman, MD;  Location[facility] ENDOSCOPY;  Service: Gastroenterology;  Laterality: N/A;  ? ESOPHAGOGASTRODUODENOSCOPY  2013  ? FOOT FRACTURE SURGERY Right 2012  ? Heel fracture repair/ has metal, fell off ladder  ? HERNIA REPAIR Right 2005  ? inguinal  ? IVC FILTER INSERTION N/A 09/23/2020  ? Procedure: IVC FILTER INSERTION;  Surgeon: Algernon Huxley, MD;  Location: [place] CV LAB;  Service: Cardiovascular;  Laterality: N/A;  ? IVC FILTER REMOVAL N/A 11/13/2020  ? Procedure: IVC FILTER REMOVAL;  Surgeon: Algernon Huxley, MD;  Location: [place] CV LAB;  Service: Cardiovascular;  Laterality: N/A;  ? TONSILLECTOMY    ? as a child  ? TOTAL HIP ARTHROPLASTY Right 10/21/2020  ? Procedure: TOTAL HIP ARTHROPLASTY;  Surgeon: Dereck Leep, MD;  Location: [facility] ORS;  Service: Orthopedics;  Laterality: Right;  ? XI ROBOTIC ASSISTED INGUINAL HERNIA  REPAIR WITH MESH Right 07/18/2021  ? Procedure: XI ROBOTIC ASSISTED INGUINAL HERNIA REPAIR WITH MESH;  Surgeon: Benjamine Sprague, DO;  Location: [facility] ORS;  Service: General;  Laterality: Right;  ? ? ?Home Medications:  ?Allergies as of 01/15/2022   ? ?   Reactions  ? Codeine Nausea Only  ? Sulfa Antibiotics Nausea And Vomiting  ? ?  ? ?  ?Medication List  ?  ? ?  ? Accurate as of January 15, 2022 10:39 AM. If you have any questions, ask your nurse or doctor.  ?  ?  ? ?  ? ?STOP taking these medications   ? ?ibuprofen 800 MG tablet ?Commonly known as: ADVIL ?Stopped by: Abbie Sons,  MD ?  ?traMADol 50 MG tablet ?Commonly known as: Ultram ?Stopped by: Abbie Sons, MD ?  ? ?  ? ?TAKE these medications   ? ?Cyanocobalamin 2500 MCG Subl ?Take 2,500 mcg by mouth daily. Takes at 11 am ?  ?lidocaine 5 % ?Commonly known as: Lidoderm ?Place 1 patch onto the skin daily. Remove & Discard patch within 12 hours or as directed by MD ?  ?OSTEO BI-FLEX REGULAR STRENGTH PO ?Take 2 tablets by mouth daily. Takes at 11 am ?  ?tamsulosin 0.4 MG Caps capsule ?Commonly known as: FLOMAX ?Take 0.4 mg by mouth daily. Takes at 11 am ?  ?triamterene-hydrochlorothiazide 37.5-25 MG tablet ?Commonly known as: MAXZIDE-25 ?Take 0.5 tablets by mouth daily. Takes at 11 am ?  ?warfarin 4 MG tablet ?Commonly known as: COUMADIN ?Take 4 mg by mouth at bedtime. ?  ? ?  ? ? ?Allergies:  ?Allergies  ?Allergen Reactions  ? Codeine Nausea Only  ? Sulfa Antibiotics Nausea And Vomiting  ? ? ?Family History: ?Family History  ?Problem Relation Age of Onset  ? Heart attack Father   ? ? ?Social History:  reports that he quit smoking about 41 years ago. His smoking use included cigarettes. He has never used smokeless tobacco. He reports that he does not currently use alcohol. He reports that he does not use drugs. ? ? ?Physical Exam: ?BP (!) 146/73   Pulse 78   Ht 5\' 9"  (1.753 m)   Wt 210 lb (95.3 kg)   BMI 31.01 kg/m?   ?Constitutional:  Alert and oriented, No acute distress. ?HEENT: [place] AT, moist mucus membranes.  Trachea midline, no masses. ?Respiratory: Normal respiratory effort, no increased work of breathing. ?GI: Abdomen is soft, nontender, nondistended, no abdominal masses ?GU: Phallus without lesion.  Testes descended bilaterally out masses or tenderness.  Cord/epididymis palpably normal bilaterally ?Skin: No rashes, bruises or suspicious lesions. ?Neurologic: Grossly intact, no focal deficits, moving all 4 extremities. ?Psychiatric: Normal mood and affect. ? ?Laboratory Data: ? ?Urinalysis ?Dipstick/microscopy  negative ? ? ?Pertinent Imaging: ?Scrotal ultrasound and pelvic CT images were personally reviewed and interpreted ? ? ?Assessment & Plan:   ? ?1.  Left inguinal/scrotal pain ?Most likely neuropathic pain ?Unlikely this would be related to his inguinal hernia surgery in 2005 ?He has had relative recent onset of sciatica and this could potentially be referred pain from disc disease ?Trial Neurontin 300 mg twice daily x30 days ?If no improvement would follow-up with Dr. Caryl Comes for further evaluation of his back pain ? ? ?Abbie Sons, MD ? ?[place] ?[address] ?[place], [place] [address] ?[phone] ? ?

## 2022-01-16 ENCOUNTER — Encounter: Payer: Self-pay | Admitting: Urology

## 2022-01-16 LAB — MICROSCOPIC EXAMINATION
Bacteria, UA: NONE SEEN
WBC, UA: NONE SEEN /hpf (ref 0–5)

## 2022-01-16 LAB — URINALYSIS, COMPLETE
Bilirubin, UA: NEGATIVE
Glucose, UA: NEGATIVE
Ketones, UA: NEGATIVE
Leukocytes,UA: NEGATIVE
Nitrite, UA: NEGATIVE
Protein,UA: NEGATIVE
Specific Gravity, UA: 1.025 (ref 1.005–1.030)
Urobilinogen, Ur: 0.2 mg/dL (ref 0.2–1.0)
pH, UA: 6 (ref 5.0–7.5)

## 2022-01-25 IMAGING — CT CT ABD-PELV W/ CM
1 of 3 series · 13 of 32 positions shown, 18 images · IV contrast (omnipaque)
Comparison: 06/30/2019

CLINICAL DATA: RIGHT lower quadrant pelvic and inguinal pain for
several weeks, pain into flank area and as well as into RIGHT
testis, when on trip to [place] and developed pain after boarding [employer]
[place] [place]. Past history of prostate cancer with radiation therapy,
RIGHT hip replacement surgery October 2020, hypertension, DVT and
IVC filter

EXAM:
CT ABDOMEN AND PELVIS WITH CONTRAST
TECHNIQUE: Multidetector CT imaging of the abdomen and pelvis was performed
using the standard protocol following bolus administration of
intravenous contrast. Sagittal and coronal MPR images reconstructed
from axial data set.
CONTRAST:  100mL OMNIPAQUE IOHEXOL 300 MG/ML SOLN IV. Dilute oral
contrast.

[Series 2: axial st · axial · 0.95mm/px · z∈[-1068,-513]mm · 13 of 125 slices shown, 18 images]
[im 7/125  soft-tissue]
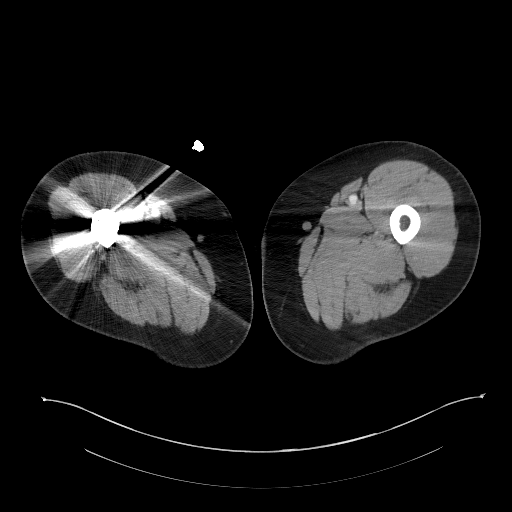
[im 7/125  bone]
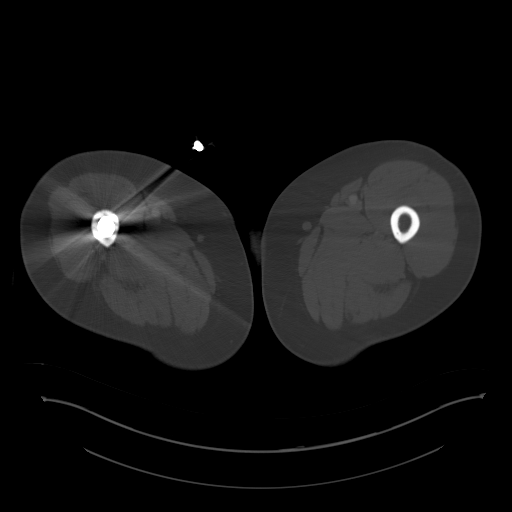
[im 20/125  soft-tissue]
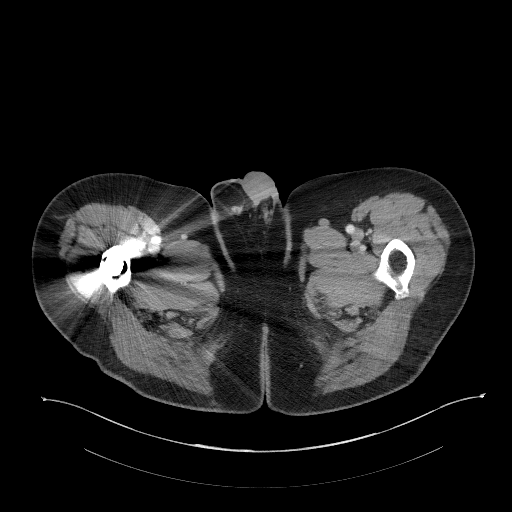
[im 27/125  soft-tissue]
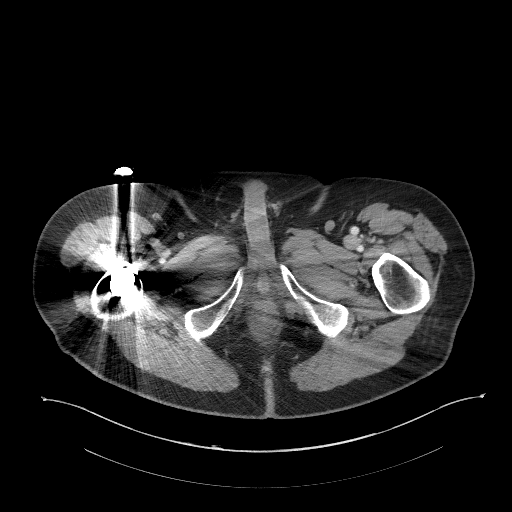
[im 40/125  soft-tissue]
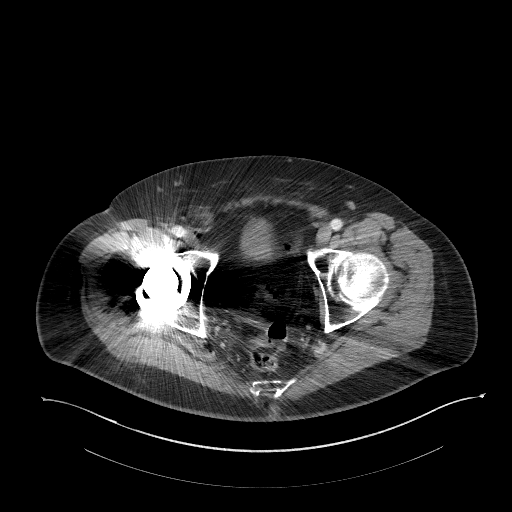
[im 46/125  soft-tissue]
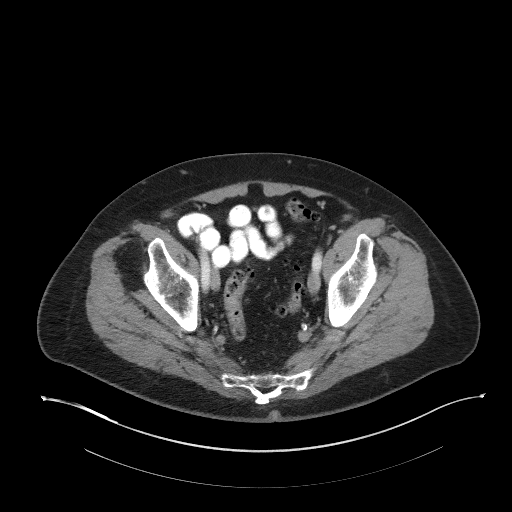
[im 59/125  soft-tissue]
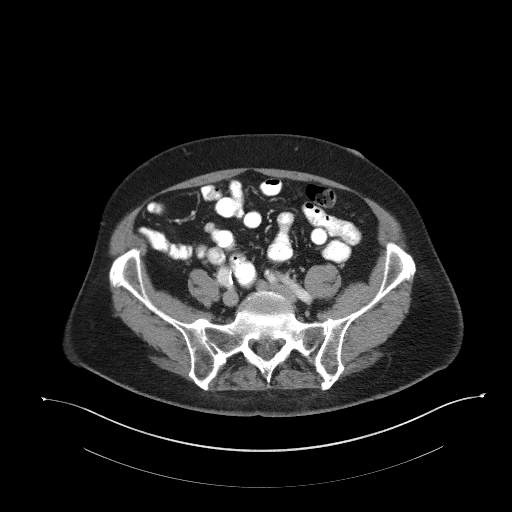
[im 66/125  soft-tissue]
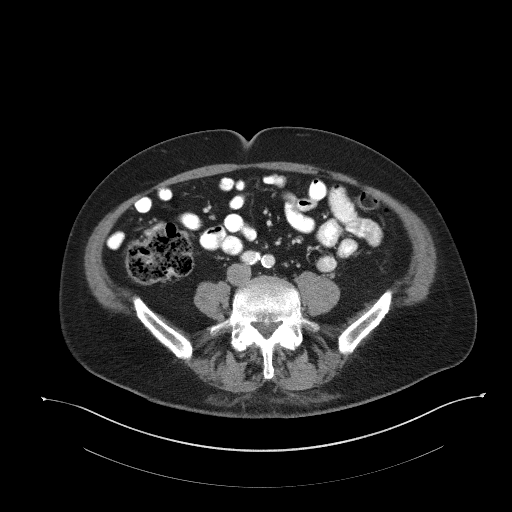
[im 79/125  soft-tissue]
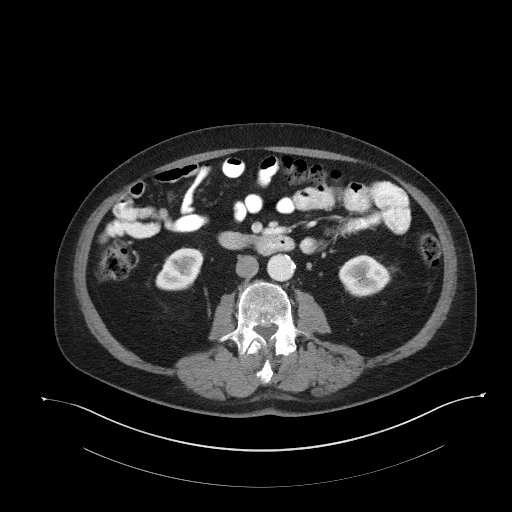
[im 85/125  soft-tissue]
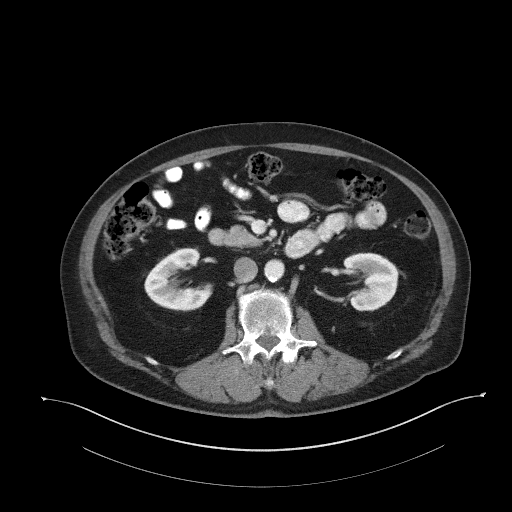
[im 85/125  bone]
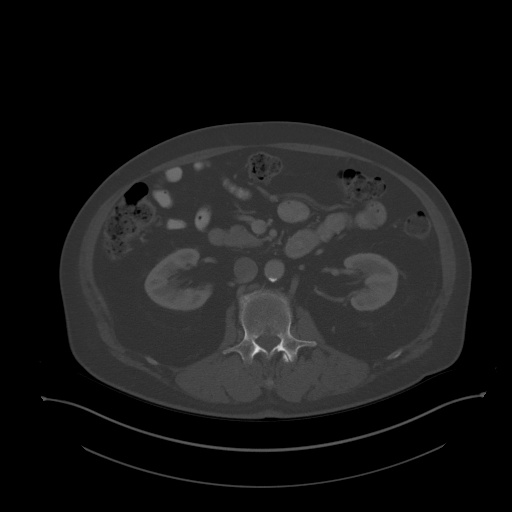
[im 98/125  soft-tissue]
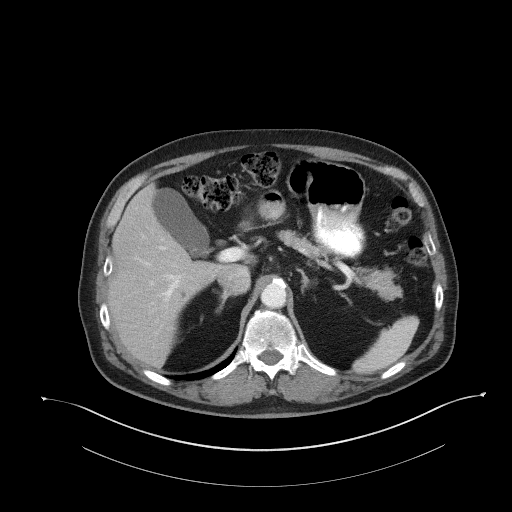
[im 98/125  lung]
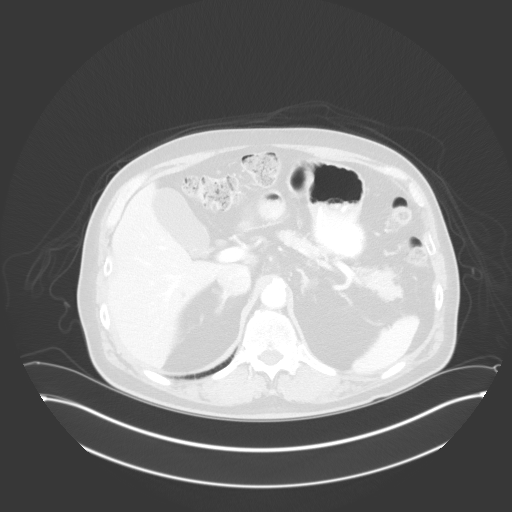
[im 105/125  soft-tissue]
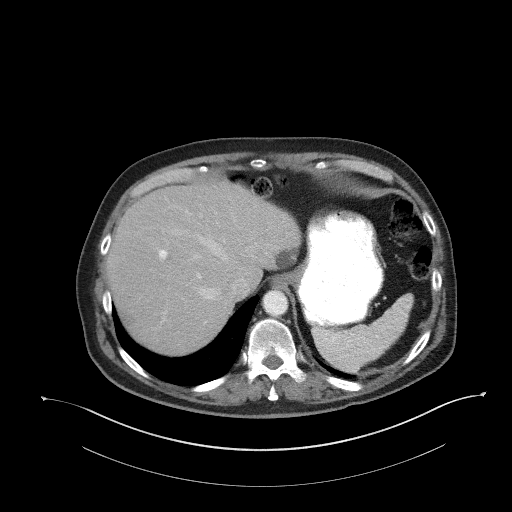
[im 105/125  lung]
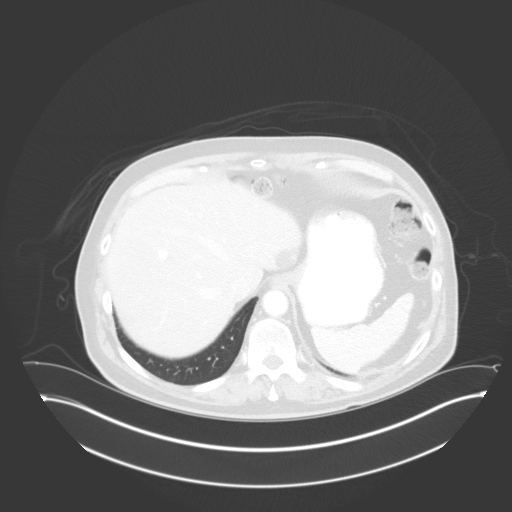
[im 111/125  lung]
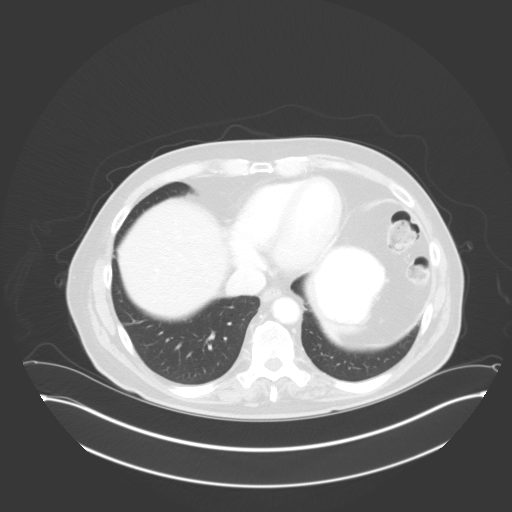
[im 118/125  soft-tissue]
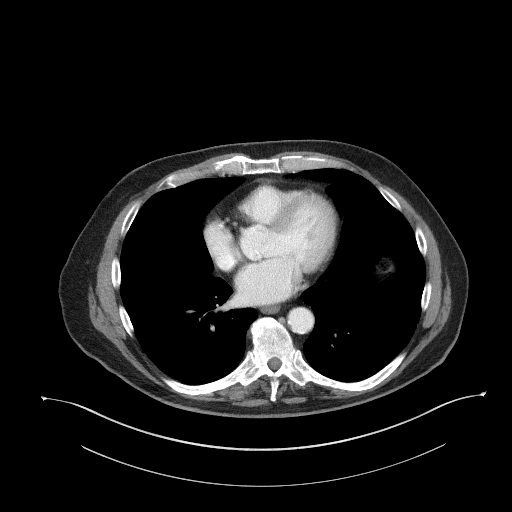
[im 118/125  lung]
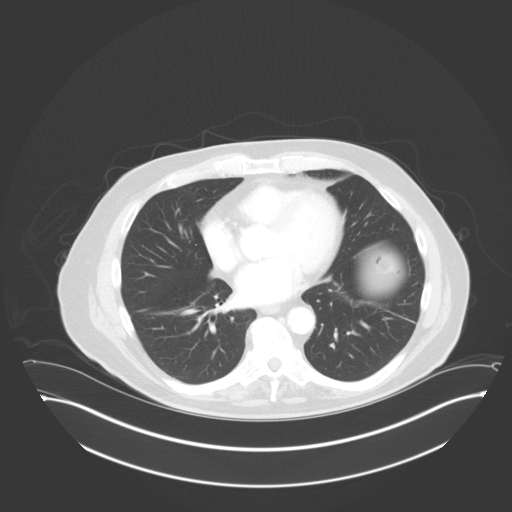

[13 of 32 positions shown; findings below may reference images not displayed]

FINDINGS: Lower chest: Minimal linear subsegmental atelectasis in lower lobes
bilaterally

Hepatobiliary: Scattered hepatic cysts predominantly lateral segment
LEFT lobe. Gallbladder and liver otherwise normal appearance.

Pancreas: Normal appearance

Spleen: Normal appearance

Adrenals/Urinary Tract: Nonobstructing 5 mm calculus mid RIGHT
kidney. Adrenal glands, kidneys, visualized ureters, and visualized
bladder normal appearance. Portions of distal RIGHT ureter and
urinary bladder are obscured by beam hardening artifacts in pelvis.

Stomach/Bowel: Appendix not visualized. Sigmoid diverticulosis
without evidence of diverticulitis. Stomach and bowel loops
otherwise normal appearance.

Vascular/Lymphatic: Atherosclerotic calcifications aorta and iliac
arteries without aneurysm. No adenopathy.

Reproductive: Minimally enlarged prostate gland.  Seminal vesicles

Other: No free air or free fluid. RIGHT inguinal hernia containing
fat which extends into scrotum.

Musculoskeletal: Beam hardening artifacts in pelvis secondary to
RIGHT hip prosthesis. Bones demineralized. No acute osseous
findings.
IMPRESSION: Nonobstructing 5 mm RIGHT renal calculus.

Sigmoid diverticulosis without evidence of diverticulitis.

RIGHT inguinal hernia containing fat which extends into scrotum.

No acute intra-abdominal or intrapelvic abnormalities.

Aortic Atherosclerosis (B8S6G-3ML.L).

## 2022-04-05 ENCOUNTER — Emergency Department
Admission: EM | Admit: 2022-04-05 | Discharge: 2022-04-05 | Disposition: A | Discharge status: Home / Self Care | Payer: Medicare HMO | Source: Home / Self Care | Attending: Emergency Medicine | Admitting: Emergency Medicine

## 2022-04-05 ENCOUNTER — Emergency Department: Payer: Medicare HMO

## 2022-04-05 ENCOUNTER — Encounter: Payer: Self-pay | Admitting: Emergency Medicine

## 2022-04-05 DIAGNOSIS — Z8546 Personal history of malignant neoplasm of prostate: Secondary | ICD-10-CM | POA: Insufficient documentation

## 2022-04-05 DIAGNOSIS — Z85828 Personal history of other malignant neoplasm of skin: Secondary | ICD-10-CM | POA: Insufficient documentation

## 2022-04-05 DIAGNOSIS — E860 Dehydration: Secondary | ICD-10-CM | POA: Diagnosis present

## 2022-04-05 DIAGNOSIS — N131 Hydronephrosis with ureteral stricture, not elsewhere classified: Secondary | ICD-10-CM | POA: Diagnosis not present

## 2022-04-05 DIAGNOSIS — N132 Hydronephrosis with renal and ureteral calculous obstruction: Secondary | ICD-10-CM | POA: Insufficient documentation

## 2022-04-05 DIAGNOSIS — Z87891 Personal history of nicotine dependence: Secondary | ICD-10-CM

## 2022-04-05 DIAGNOSIS — Z882 Allergy status to sulfonamides status: Secondary | ICD-10-CM

## 2022-04-05 DIAGNOSIS — Z6831 Body mass index (BMI) 31.0-31.9, adult: Secondary | ICD-10-CM

## 2022-04-05 DIAGNOSIS — Z923 Personal history of irradiation: Secondary | ICD-10-CM

## 2022-04-05 DIAGNOSIS — Z86718 Personal history of other venous thrombosis and embolism: Secondary | ICD-10-CM

## 2022-04-05 DIAGNOSIS — Z87442 Personal history of urinary calculi: Secondary | ICD-10-CM

## 2022-04-05 DIAGNOSIS — Z7901 Long term (current) use of anticoagulants: Secondary | ICD-10-CM

## 2022-04-05 DIAGNOSIS — Z66 Do not resuscitate: Secondary | ICD-10-CM | POA: Diagnosis present

## 2022-04-05 DIAGNOSIS — R791 Abnormal coagulation profile: Secondary | ICD-10-CM | POA: Insufficient documentation

## 2022-04-05 DIAGNOSIS — Z96641 Presence of right artificial hip joint: Secondary | ICD-10-CM | POA: Diagnosis present

## 2022-04-05 DIAGNOSIS — N179 Acute kidney failure, unspecified: Secondary | ICD-10-CM | POA: Diagnosis not present

## 2022-04-05 DIAGNOSIS — Z79899 Other long term (current) drug therapy: Secondary | ICD-10-CM

## 2022-04-05 DIAGNOSIS — I10 Essential (primary) hypertension: Secondary | ICD-10-CM | POA: Diagnosis present

## 2022-04-05 DIAGNOSIS — E785 Hyperlipidemia, unspecified: Secondary | ICD-10-CM | POA: Diagnosis present

## 2022-04-05 DIAGNOSIS — N23 Unspecified renal colic: Secondary | ICD-10-CM

## 2022-04-05 DIAGNOSIS — F419 Anxiety disorder, unspecified: Secondary | ICD-10-CM | POA: Diagnosis present

## 2022-04-05 DIAGNOSIS — E669 Obesity, unspecified: Secondary | ICD-10-CM | POA: Diagnosis present

## 2022-04-05 DIAGNOSIS — N201 Calculus of ureter: Secondary | ICD-10-CM

## 2022-04-05 DIAGNOSIS — Z8249 Family history of ischemic heart disease and other diseases of the circulatory system: Secondary | ICD-10-CM

## 2022-04-05 DIAGNOSIS — Z885 Allergy status to narcotic agent status: Secondary | ICD-10-CM

## 2022-04-05 LAB — CBC WITH DIFFERENTIAL/PLATELET
Abs Immature Granulocytes: 0.02 10*3/uL (ref 0.00–0.07)
Basophils Absolute: 0.1 10*3/uL (ref 0.0–0.1)
Basophils Relative: 1 %
Eosinophils Absolute: 0.1 10*3/uL (ref 0.0–0.5)
Eosinophils Relative: 2 %
HCT: 40.8 % (ref 39.0–52.0)
Hemoglobin: 13.5 g/dL (ref 13.0–17.0)
Immature Granulocytes: 0 %
Lymphocytes Relative: 45 %
Lymphs Abs: 2.5 10*3/uL (ref 0.7–4.0)
MCH: 29.9 pg (ref 26.0–34.0)
MCHC: 33.1 g/dL (ref 30.0–36.0)
MCV: 90.5 fL (ref 80.0–100.0)
Monocytes Absolute: 0.4 10*3/uL (ref 0.1–1.0)
Monocytes Relative: 8 %
Neutro Abs: 2.5 10*3/uL (ref 1.7–7.7)
Neutrophils Relative %: 44 %
Platelets: 253 10*3/uL (ref 150–400)
RBC: 4.51 MIL/uL (ref 4.22–5.81)
RDW: 13.2 % (ref 11.5–15.5)
WBC: 5.7 10*3/uL (ref 4.0–10.5)
nRBC: 0 % (ref 0.0–0.2)

## 2022-04-05 LAB — BASIC METABOLIC PANEL
Anion gap: 10 (ref 5–15)
BUN: 23 mg/dL (ref 8–23)
CO2: 24 mmol/L (ref 22–32)
Calcium: 9.5 mg/dL (ref 8.9–10.3)
Chloride: 105 mmol/L (ref 98–111)
Creatinine, Ser: 1.47 mg/dL — ABNORMAL HIGH (ref 0.61–1.24)
GFR, Estimated: 49 mL/min — ABNORMAL LOW (ref >60)
Glucose, Bld: 119 mg/dL — ABNORMAL HIGH (ref 70–99)
Potassium: 3.9 mmol/L (ref 3.5–5.1)
Sodium: 139 mmol/L (ref 135–145)

## 2022-04-05 LAB — URINALYSIS, COMPLETE (UACMP) WITH MICROSCOPIC
Bilirubin Urine: NEGATIVE
Glucose, UA: NEGATIVE mg/dL
Ketones, ur: 5 mg/dL — AB
Leukocytes,Ua: NEGATIVE
Nitrite: NEGATIVE
Protein, ur: 100 mg/dL — AB
RBC / HPF: >50 RBC/hpf — ABNORMAL HIGH (ref 0–5)
Specific Gravity, Urine: 1.02 (ref 1.005–1.030)
Squamous Epithelial / HPF: NONE SEEN (ref 0–5)
WBC, UA: >50 WBC/hpf — ABNORMAL HIGH (ref 0–5)
pH: 6 (ref 5.0–8.0)

## 2022-04-05 LAB — COMPREHENSIVE METABOLIC PANEL
ALT: 18 U/L (ref 0–44)
AST: 22 U/L (ref 15–41)
Albumin: 3.9 g/dL (ref 3.5–5.0)
Alkaline Phosphatase: 64 U/L (ref 38–126)
Anion gap: 11 (ref 5–15)
BUN: 24 mg/dL — ABNORMAL HIGH (ref 8–23)
CO2: 22 mmol/L (ref 22–32)
Calcium: 8.9 mg/dL (ref 8.9–10.3)
Chloride: 107 mmol/L (ref 98–111)
Creatinine, Ser: 1.13 mg/dL (ref 0.61–1.24)
GFR, Estimated: >60 mL/min (ref >60)
Glucose, Bld: 121 mg/dL — ABNORMAL HIGH (ref 70–99)
Potassium: 3.9 mmol/L (ref 3.5–5.1)
Sodium: 140 mmol/L (ref 135–145)
Total Bilirubin: 0.9 mg/dL (ref 0.3–1.2)
Total Protein: 6.8 g/dL (ref 6.5–8.1)

## 2022-04-05 LAB — CBC
HCT: 42 % (ref 39.0–52.0)
Hemoglobin: 13.9 g/dL (ref 13.0–17.0)
MCH: 30 pg (ref 26.0–34.0)
MCHC: 33.1 g/dL (ref 30.0–36.0)
MCV: 90.7 fL (ref 80.0–100.0)
Platelets: 276 10*3/uL (ref 150–400)
RBC: 4.63 MIL/uL (ref 4.22–5.81)
RDW: 13.2 % (ref 11.5–15.5)
WBC: 8.7 10*3/uL (ref 4.0–10.5)
nRBC: 0 % (ref 0.0–0.2)

## 2022-04-05 LAB — LIPASE, BLOOD: Lipase: 34 U/L (ref 11–51)

## 2022-04-05 LAB — PROTIME-INR
INR: 2 — ABNORMAL HIGH (ref 0.8–1.2)
Prothrombin Time: 22.8 seconds — ABNORMAL HIGH (ref 11.4–15.2)

## 2022-04-05 MED ORDER — KETOROLAC TROMETHAMINE 15 MG/ML IJ SOLN
15.0000 mg | Freq: Once | INTRAMUSCULAR | Status: AC
  Administered 2022-04-05: 15 mg via INTRAMUSCULAR
  Filled 2022-04-05: qty 1

## 2022-04-05 MED ORDER — TAMSULOSIN HCL 0.4 MG PO CAPS
0.4000 mg | ORAL_CAPSULE | Freq: Once | ORAL | Status: AC
  Administered 2022-04-05: 0.4 mg via ORAL
  Filled 2022-04-05: qty 1

## 2022-04-05 MED ORDER — TAMSULOSIN HCL 0.4 MG PO CAPS: 0.4000 mg | ORAL_CAPSULE | Freq: Every day | ORAL | 0 refills | Status: AC

## 2022-04-05 MED ORDER — OXYCODONE HCL 5 MG PO TABS
5.0000 mg | ORAL_TABLET | Freq: Once | ORAL | Status: AC
  Administered 2022-04-05: 5 mg via ORAL
  Filled 2022-04-05: qty 1

## 2022-04-05 MED ORDER — ONDANSETRON HCL 4 MG/2ML IJ SOLN
4.0000 mg | Freq: Once | INTRAMUSCULAR | Status: AC
  Administered 2022-04-05: 4 mg via INTRAVENOUS
  Filled 2022-04-05: qty 2

## 2022-04-05 MED ORDER — FENTANYL CITRATE PF 50 MCG/ML IJ SOSY
50.0000 ug | PREFILLED_SYRINGE | Freq: Once | INTRAMUSCULAR | Status: AC
  Administered 2022-04-05: 50 ug via INTRAVENOUS
  Filled 2022-04-05: qty 1

## 2022-04-05 MED ORDER — ONDANSETRON 4 MG PO TBDP: 4.0000 mg | ORAL_TABLET | Freq: Three times a day (TID) | ORAL | 0 refills | Status: DC | PRN

## 2022-04-05 MED ORDER — OXYCODONE-ACETAMINOPHEN 5-325 MG PO TABS: 1.0000 | ORAL_TABLET | ORAL | 0 refills | Status: DC | PRN

## 2022-04-05 MED ORDER — CEFDINIR 300 MG PO CAPS: 300.0000 mg | ORAL_CAPSULE | Freq: Two times a day (BID) | ORAL | 0 refills | Status: DC

## 2022-04-05 MED ORDER — ACETAMINOPHEN 500 MG PO TABS
1000.0000 mg | ORAL_TABLET | Freq: Once | ORAL | Status: AC
  Administered 2022-04-05: 1000 mg via ORAL
  Filled 2022-04-05: qty 2

## 2022-04-05 NOTE — ED Triage Notes (Addendum)
Pt presents with complaints of right hip pain - stating he woke up with the pain. Of note, the patient had hip sx on the right hip in 2021. He took an oxycodone PTA. Denies any falls or recent injury to that hip.

## 2022-04-05 NOTE — Discharge Instructions (Addendum)
Take the antibiotic and the Flomax as prescribed.  You may take the Zofran and pain medication as needed.  Follow-up with the urologist.  Return to the ER immediately for new, worsening, or persistent severe pain, any fever, vomiting, weakness, or any other new or worsening symptoms that concern you.

## 2022-04-05 NOTE — ED Provider Notes (Signed)
[facility] Provider Note    Event Date/Time   First MD Initiated Contact with Patient 04/05/22 0515     (approximate)   History   Hip Pain   HPI  [name] [name] is a 76 y.o. male with a history of DVT on Coumadin, hypertension, hyperlipidemia, remote prostate cancer who presents for evaluation of right hip pain.  Patient reports sudden onset of severe sharp pain that started this evening.  He reports that the pain starts in his right lower back and radiates across into the right lower abdomen.  He has had nausea associated with it.  No dysuria or hematuria.  No vomiting.  No chest pain or shortness of breath.     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   Prostate cancer ([place]) 2018   Treated with radiation   Rectal bleeding    2013, 2020    Past Surgical History:  Procedure Laterality Date   COLONOSCOPY N/A 07/02/2019   Procedure: COLONOSCOPY;  Surgeon: Lin Landsman, MD;  Location: [facility] ENDOSCOPY;  Service: Gastroenterology;  Laterality: N/A;   COLONOSCOPY     2001, 2011   ESOPHAGOGASTRODUODENOSCOPY N/A 07/02/2019   Procedure: ESOPHAGOGASTRODUODENOSCOPY (EGD);  Surgeon: Lin Landsman, MD;  Location: [facility] ENDOSCOPY;  Service: Gastroenterology;  Laterality: N/A;   ESOPHAGOGASTRODUODENOSCOPY  2013   FOOT FRACTURE SURGERY Right 2012   Heel fracture repair/ has metal, fell off ladder   HERNIA REPAIR Right 2005   inguinal   IVC FILTER INSERTION N/A 09/23/2020   Procedure: IVC FILTER INSERTION;  Surgeon: Algernon Huxley, MD;  Location: [place] CV LAB;  Service: Cardiovascular;  Laterality: N/A;   IVC  FILTER REMOVAL N/A 11/13/2020   Procedure: IVC FILTER REMOVAL;  Surgeon: Algernon Huxley, MD;  Location: [place] CV LAB;  Service: Cardiovascular;  Laterality: N/A;   TONSILLECTOMY     as a child   TOTAL HIP ARTHROPLASTY Right 10/21/2020   Procedure: TOTAL HIP ARTHROPLASTY;  Surgeon: Dereck Leep, MD;  Location: [facility] ORS;  Service: Orthopedics;  Laterality: Right;   XI ROBOTIC ASSISTED INGUINAL HERNIA REPAIR WITH MESH Right 07/18/2021   Procedure: XI ROBOTIC ASSISTED INGUINAL HERNIA REPAIR WITH MESH;  Surgeon: Benjamine Sprague, DO;  Location: [facility] ORS;  Service: General;  Laterality: Right;     Physical Exam   Triage Vital Signs: ED Triage Vitals  Enc Vitals Group     BP 04/05/22 0509 (!) 154/82     Pulse Rate 04/05/22 0509 75     Resp 04/05/22 0509 20     Temp 04/05/22 0509 97.9 F (36.6 C)     Temp Source 04/05/22 0509 Oral     SpO2 04/05/22 0509 100 %     Weight 04/05/22 0510 210 lb (95.3 kg)     Height 04/05/22 0510 '5\' 9"'$  (1.753 m)     Head Circumference --      Peak Flow --      Pain Score 04/05/22 0505 10     Pain Loc --  Pain Edu? --      Excl. in [place]? --     Most recent vital signs: Vitals:   04/05/22 0509 04/05/22 0706  BP: (!) 154/82 140/72  Pulse: 75 79  Resp: 20 20  Temp: 97.9 F (36.6 C)   SpO2: 100% 99%     Constitutional: Alert and oriented.  Looks uncomfortable due to pain HEENT:      Head: Normocephalic and atraumatic.         Eyes: Conjunctivae are normal. Sclera is non-icteric.       Mouth/Throat: Mucous membranes are moist.       Neck: Supple with no signs of meningismus. Cardiovascular: Regular rate and rhythm. No murmurs, gallops, or rubs. 2+ symmetrical distal pulses are present in all extremities.  Respiratory: Normal respiratory effort. Lungs are clear to auscultation bilaterally.  Gastrointestinal: Soft, non tender, and non distended with positive bowel sounds. No rebound or guarding. Genitourinary: R CVA tenderness. Musculoskeletal:   No edema, cyanosis, or erythema of extremities. Neurologic: Normal speech and language. Face is symmetric. Moving all extremities. No gross focal neurologic deficits are appreciated. Skin: Skin is warm, [place] and intact. No rash noted. Psychiatric: Mood and affect are normal. Speech and behavior are normal.  ED Results / Procedures / Treatments   Labs (all labs ordered are listed, but only abnormal results are displayed) Labs Reviewed  COMPREHENSIVE METABOLIC PANEL - Abnormal; Notable for the following components:      Result Value   Glucose, Bld 121 (*)    BUN 24 (*)    All other components within normal limits  CBC WITH DIFFERENTIAL/PLATELET  LIPASE, BLOOD  URINALYSIS, COMPLETE (UACMP) WITH MICROSCOPIC  PROTIME-INR     EKG  ED ECG REPORT I, Rudene Re, the attending physician, personally viewed and interpreted this ECG.  Sinus rhythm with a left bundle branch block, rate of 72, no ST elevations or depression.  Unchanged when compared to prior  RADIOLOGY I, Rudene Re, attending MD, have personally viewed and interpreted the images obtained during this visit as below:  CT renal showing right-sided ureteral stone   ___________________________________________________ Interpretation by Radiologist:  CT Renal Stone Study  Result Date: 04/05/2022 CLINICAL DATA:  76 year old male with history of flank pain. Suspected kidney stone. EXAM: CT ABDOMEN AND PELVIS WITHOUT CONTRAST TECHNIQUE: Multidetector CT imaging of the abdomen and pelvis was performed following the standard protocol without IV contrast. RADIATION DOSE REDUCTION: This exam was performed according to the departmental dose-optimization program which includes automated exposure control, adjustment of the mA and/or kV according to patient size and/or use of iterative reconstruction technique. COMPARISON:  CT the abdomen and pelvis 07/03/2021. CT the pelvis 12/22/2021. FINDINGS: Lower chest: Mild linear  scarring throughout the lung bases bilaterally. Atherosclerotic calcifications in the thoracic aorta. Hepatobiliary: Several low-attenuation lesions are noted in the left lobe of the liver, incompletely characterized on today's noncontrast CT examination, but statistically likely to represent cysts, largest of which measures 2.5 x 2.1 cm in the central aspect of segment 2 of the liver. No other definite suspicious hepatic lesions are confidently identified on today's noncontrast CT examination. Unenhanced appearance of the gallbladder is normal. Pancreas: No definite pancreatic mass or peripancreatic fluid collections or inflammatory changes are noted on today's noncontrast CT examination. Spleen: Unremarkable. Adrenals/Urinary Tract: Several tiny 1-2 mm nonobstructive calculi are noted within the collecting systems of both kidneys. In the middle third of the right ureter (coronal image 49 of series 5) there are 2 adjacent 3  mm calculi. No additional calculi are noted elsewhere along the course of the right ureter, within the left ureter, or in the lumen of the urinary bladder. At this time, there is very mild proximal right hydroureteronephrosis. No left hydroureteronephrosis. Unenhanced appearance of the kidneys and bilateral adrenal glands is unremarkable. Urinary bladder is partially obscured by extensive beam hardening artifact from the patient's right hip arthroplasty. Visualized portions of the urinary bladder are unremarkable. Stomach/Bowel: The appearance of the stomach is normal. There is no pathologic dilatation of small bowel or colon. Numerous colonic diverticulae are noted, without surrounding inflammatory changes to suggest an acute diverticulitis at this time. The appendix is not confidently identified and may be surgically absent. Regardless, there are no inflammatory changes noted adjacent to the cecum to suggest the presence of an acute appendicitis at this time. Vascular/Lymphatic:  Atherosclerotic calcifications in the abdominal aorta and pelvic vasculature. Fusiform ectasia of the infrarenal abdominal aorta which measures up to 2.7 cm in diameter. No lymphadenopathy noted in the abdomen or pelvis. Reproductive: Prostate gland and seminal vesicles are largely obscured by beam hardening artifact from the patient's right hip arthroplasty. Other: No significant volume of ascites.  No pneumoperitoneum. Musculoskeletal: Status post right hip arthroplasty. There are no aggressive appearing lytic or blastic lesions noted in the visualized portions of the skeleton. IMPRESSION: 1. In the middle third of the right ureter there are 2 adjacent 3 mm calculi which are associated with mild proximal right hydroureteronephrosis. 2. Multiple additional 1-2 mm nonobstructive calculi in the collecting systems of both kidneys. 3. Colonic diverticulosis without evidence of acute diverticulitis at this time. 4. 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 Recommend follow-up ultrasound every 5 years. This recommendation follows ACR consensus guidelines: White Paper of the ACR Incidental Findings Committee II on Vascular Findings. J Am Coll Radiol 2013; 10:789-794. Electronically Signed   By: Vinnie Langton M.D.   On: 04/05/2022 06:22       PROCEDURES:  Critical Care performed: No  Procedures    IMPRESSION / MDM / ASSESSMENT AND PLAN / ED COURSE  I reviewed the triage vital signs and the nursing notes.  76 y.o. male with a history of DVT on Coumadin, hypertension, hyperlipidemia, remote prostate cancer who presents for evaluation of right hip pain.  Patient looks uncomfortable, has right flank tenderness, abdomen soft.  Ddx: Kidney stone versus pyelonephritis versus UTI versus gallbladder pathology versus AAA versus dissection   Plan: CT renal, IV fentanyl for pain, CBC, CMP, lipase, urinalysis, EKG.  Patient placed on  telemetry for monitoring of cardiorespiratory status.   MEDICATIONS GIVEN IN ED: Medications  fentaNYL (SUBLIMAZE) injection 50 mcg (50 mcg Intravenous Given 04/05/22 0537)  ondansetron (ZOFRAN) injection 4 mg (4 mg Intravenous Given 04/05/22 0537)  acetaminophen (TYLENOL) tablet 1,000 mg (1,000 mg Oral Given 04/05/22 0706)  oxyCODONE (Oxy IR/ROXICODONE) immediate release tablet 5 mg (5 mg Oral Given 04/05/22 0706)  tamsulosin (FLOMAX) capsule 0.4 mg (0.4 mg Oral Given 04/05/22 0706)     ED COURSE:  CT consistent with 2x 3 mm right ureteral stone with hydronephrosis.  Patient's pain is improved on fentanyl.  Because is on Coumadin we will hold off Toradol.  Will give Flomax, and p.o. Tylenol and oxycodone.  Labs showing no signs of AKI.  UA is pending.  Plan to discharge home if pain is well controlled and there is no overlying urinary tract infection.  Care transferred to  Dr. Cherylann Banas   Consults: none   EMR reviewed including patient's last visit with his primary care doctor from 3 days ago for cellulitis    FINAL CLINICAL IMPRESSION(S) / ED DIAGNOSES   Final diagnoses:  Renal colic  Ureteral stone     Rx / DC Orders   ED Discharge Orders          Ordered    oxyCODONE-acetaminophen (PERCOCET) 5-325 MG tablet  Every 4 hours PRN        04/05/22 0714    tamsulosin (FLOMAX) 0.4 MG CAPS capsule  Daily        04/05/22 0714    ondansetron (ZOFRAN-ODT) 4 MG disintegrating tablet  Every 8 hours PRN        04/05/22 0714    Ambulatory referral to Urology       Comments: Kidney stone   04/05/22 0715             Note:  This document was prepared using Dragon voice recognition software and may include unintentional dictation errors.   Please note:  Patient was evaluated in Emergency Department today for the symptoms described in the history of present illness. Patient was evaluated in the context of the global COVID-19 pandemic, which necessitated consideration that the patient  might be at risk for infection with the SARS-CoV-2 virus that causes COVID-19. Institutional protocols and algorithms that pertain to the evaluation of patients at risk for COVID-19 are in a state of rapid change based on information released by regulatory bodies including the CDC and federal and state organizations. These policies and algorithms were followed during the patient's care in the ED.  Some ED evaluations and interventions may be delayed as a result of limited staffing during the pandemic.       Alfred Levins, [place], MD 04/05/22 [phone]

## 2022-04-05 NOTE — ED Triage Notes (Addendum)
Pt presents via POV with complaints of right sided flank pain - diagnosed with kidney stone last night and states that the pain meds wasn't very effective. He last took oxycodone at 1730. Denies CP or SOB.

## 2022-04-05 NOTE — ED Provider Notes (Signed)
-----------------------------------------   10:25 AM on 04/05/2022 -----------------------------------------  I took over care of this patient from Dr. Alfred Levins.  Urinalysis shows significant RBCs as well as WBCs and bacteria although no nitrates.  Patient does not have any leukocytosis or fever so my overall clinical suspicion for pyelonephritis is low.  He also did not have any dysuria or symptoms preceding the acute onset of pain this morning.  I consulted Dr. Claudia Desanctis from urology and discussed the case with her.  She does recommend starting the patient on empiric antibiotics so I have prescribed cefdinir in addition to the medications prescribed by Dr. Alfred Levins.  On reassessment the patient is comfortable appearing and feels well to go home.  He is stable for discharge at this time I counseled him on the results of the work-up and gave him very thorough return precautions with specific emphasis on fever or any symptoms of pyelonephritis, and the patient expressed understanding.   Arta Silence, MD 04/05/22 1027

## 2022-04-06 ENCOUNTER — Other Ambulatory Visit: Payer: Self-pay | Admitting: Urology

## 2022-04-06 ENCOUNTER — Encounter: Payer: Self-pay | Admitting: Family Medicine

## 2022-04-06 ENCOUNTER — Encounter
Admission: EM | Disposition: A | Discharge status: Home / Self Care | Payer: Self-pay | Source: Home / Self Care | Attending: Internal Medicine

## 2022-04-06 ENCOUNTER — Observation Stay: Payer: Medicare HMO

## 2022-04-06 ENCOUNTER — Other Ambulatory Visit: Payer: Self-pay

## 2022-04-06 ENCOUNTER — Observation Stay: Payer: Medicare HMO | Admitting: Registered Nurse

## 2022-04-06 ENCOUNTER — Inpatient Hospital Stay
Admission: EM | Admit: 2022-04-06 | Discharge: 2022-04-07 | DRG: 661 | Disposition: A | Discharge status: Home / Self Care | Payer: Medicare HMO | Attending: Internal Medicine | Admitting: Internal Medicine

## 2022-04-06 DIAGNOSIS — N179 Acute kidney failure, unspecified: Secondary | ICD-10-CM | POA: Diagnosis present

## 2022-04-06 DIAGNOSIS — Z86718 Personal history of other venous thrombosis and embolism: Secondary | ICD-10-CM | POA: Diagnosis not present

## 2022-04-06 DIAGNOSIS — Z79899 Other long term (current) drug therapy: Secondary | ICD-10-CM | POA: Diagnosis not present

## 2022-04-06 DIAGNOSIS — Z882 Allergy status to sulfonamides status: Secondary | ICD-10-CM | POA: Diagnosis not present

## 2022-04-06 DIAGNOSIS — F419 Anxiety disorder, unspecified: Secondary | ICD-10-CM | POA: Clinically undetermined

## 2022-04-06 DIAGNOSIS — N23 Unspecified renal colic: Secondary | ICD-10-CM | POA: Diagnosis not present

## 2022-04-06 DIAGNOSIS — I77811 Abdominal aortic ectasia: Secondary | ICD-10-CM | POA: Diagnosis present

## 2022-04-06 DIAGNOSIS — I10 Essential (primary) hypertension: Secondary | ICD-10-CM | POA: Diagnosis present

## 2022-04-06 DIAGNOSIS — N201 Calculus of ureter: Secondary | ICD-10-CM | POA: Diagnosis not present

## 2022-04-06 DIAGNOSIS — N131 Hydronephrosis with ureteral stricture, not elsewhere classified: Secondary | ICD-10-CM | POA: Diagnosis present

## 2022-04-06 DIAGNOSIS — Z923 Personal history of irradiation: Secondary | ICD-10-CM | POA: Diagnosis not present

## 2022-04-06 DIAGNOSIS — Z87442 Personal history of urinary calculi: Secondary | ICD-10-CM | POA: Diagnosis not present

## 2022-04-06 DIAGNOSIS — E785 Hyperlipidemia, unspecified: Secondary | ICD-10-CM | POA: Diagnosis present

## 2022-04-06 DIAGNOSIS — Z7901 Long term (current) use of anticoagulants: Secondary | ICD-10-CM | POA: Diagnosis not present

## 2022-04-06 DIAGNOSIS — Z885 Allergy status to narcotic agent status: Secondary | ICD-10-CM | POA: Diagnosis not present

## 2022-04-06 DIAGNOSIS — E860 Dehydration: Secondary | ICD-10-CM | POA: Diagnosis present

## 2022-04-06 DIAGNOSIS — Z6831 Body mass index (BMI) 31.0-31.9, adult: Secondary | ICD-10-CM | POA: Diagnosis not present

## 2022-04-06 DIAGNOSIS — N139 Obstructive and reflux uropathy, unspecified: Secondary | ICD-10-CM | POA: Diagnosis present

## 2022-04-06 DIAGNOSIS — E669 Obesity, unspecified: Secondary | ICD-10-CM | POA: Diagnosis present

## 2022-04-06 DIAGNOSIS — Z8249 Family history of ischemic heart disease and other diseases of the circulatory system: Secondary | ICD-10-CM | POA: Diagnosis not present

## 2022-04-06 DIAGNOSIS — Z96641 Presence of right artificial hip joint: Secondary | ICD-10-CM | POA: Diagnosis present

## 2022-04-06 DIAGNOSIS — C61 Malignant neoplasm of prostate: Secondary | ICD-10-CM | POA: Diagnosis present

## 2022-04-06 DIAGNOSIS — Z66 Do not resuscitate: Secondary | ICD-10-CM | POA: Diagnosis present

## 2022-04-06 DIAGNOSIS — Z8546 Personal history of malignant neoplasm of prostate: Secondary | ICD-10-CM | POA: Diagnosis not present

## 2022-04-06 DIAGNOSIS — Z85828 Personal history of other malignant neoplasm of skin: Secondary | ICD-10-CM | POA: Diagnosis not present

## 2022-04-06 DIAGNOSIS — N1330 Unspecified hydronephrosis: Secondary | ICD-10-CM

## 2022-04-06 DIAGNOSIS — Z87891 Personal history of nicotine dependence: Secondary | ICD-10-CM | POA: Diagnosis not present

## 2022-04-06 HISTORY — PX: CYSTOSCOPY W/ RETROGRADES: SHX1426

## 2022-04-06 HISTORY — PX: CYSTOSCOPY/URETEROSCOPY/HOLMIUM LASER/STENT PLACEMENT: SHX6546

## 2022-04-06 LAB — URINALYSIS, ROUTINE W REFLEX MICROSCOPIC
Bacteria, UA: NONE SEEN
Bilirubin Urine: NEGATIVE
Glucose, UA: NEGATIVE mg/dL
Ketones, ur: 20 mg/dL — AB
Leukocytes,Ua: NEGATIVE
Nitrite: NEGATIVE
Protein, ur: 30 mg/dL — AB
RBC / HPF: >50 RBC/hpf — ABNORMAL HIGH (ref 0–5)
Specific Gravity, Urine: 1.03 (ref 1.005–1.030)
WBC, UA: NONE SEEN WBC/hpf (ref 0–5)
pH: 5 (ref 5.0–8.0)

## 2022-04-06 LAB — BASIC METABOLIC PANEL
Anion gap: 7 (ref 5–15)
BUN: 27 mg/dL — ABNORMAL HIGH (ref 8–23)
CO2: 26 mmol/L (ref 22–32)
Calcium: 8.7 mg/dL — ABNORMAL LOW (ref 8.9–10.3)
Chloride: 107 mmol/L (ref 98–111)
Creatinine, Ser: 1.58 mg/dL — ABNORMAL HIGH (ref 0.61–1.24)
GFR, Estimated: 45 mL/min — ABNORMAL LOW (ref >60)
Glucose, Bld: 103 mg/dL — ABNORMAL HIGH (ref 70–99)
Potassium: 3.9 mmol/L (ref 3.5–5.1)
Sodium: 140 mmol/L (ref 135–145)

## 2022-04-06 LAB — CBC
HCT: 38.4 % — ABNORMAL LOW (ref 39.0–52.0)
Hemoglobin: 12.4 g/dL — ABNORMAL LOW (ref 13.0–17.0)
MCH: 29.5 pg (ref 26.0–34.0)
MCHC: 32.3 g/dL (ref 30.0–36.0)
MCV: 91.4 fL (ref 80.0–100.0)
Platelets: 230 10*3/uL (ref 150–400)
RBC: 4.2 MIL/uL — ABNORMAL LOW (ref 4.22–5.81)
RDW: 13.4 % (ref 11.5–15.5)
WBC: 8 10*3/uL (ref 4.0–10.5)
nRBC: 0 % (ref 0.0–0.2)

## 2022-04-06 LAB — PROTIME-INR
INR: 2 — ABNORMAL HIGH (ref 0.8–1.2)
Prothrombin Time: 22.5 seconds — ABNORMAL HIGH (ref 11.4–15.2)

## 2022-04-06 SURGERY — CYSTOSCOPY/URETEROSCOPY/HOLMIUM LASER/STENT PLACEMENT
Anesthesia: General | Laterality: Right

## 2022-04-06 MED ORDER — ALPRAZOLAM 0.5 MG PO TABS
0.2500 mg | ORAL_TABLET | ORAL | Status: AC
  Administered 2022-04-06: 0.25 mg via ORAL
  Filled 2022-04-06: qty 1

## 2022-04-06 MED ORDER — PROPOFOL 10 MG/ML IV BOLUS
INTRAVENOUS | Status: DC | PRN
  Administered 2022-04-06: 150 mg via INTRAVENOUS

## 2022-04-06 MED ORDER — WARFARIN SODIUM 3 MG PO TABS: 3.0000 mg | ORAL_TABLET | ORAL | Status: DC

## 2022-04-06 MED ORDER — ONDANSETRON HCL 4 MG/2ML IJ SOLN
4.0000 mg | Freq: Four times a day (QID) | INTRAMUSCULAR | Status: DC | PRN
  Administered 2022-04-06: 4 mg via INTRAVENOUS
  Filled 2022-04-06: qty 2

## 2022-04-06 MED ORDER — LIDOCAINE HCL (CARDIAC) PF 100 MG/5ML IV SOSY
PREFILLED_SYRINGE | INTRAVENOUS | Status: DC | PRN
  Administered 2022-04-06: 100 mg via INTRAVENOUS

## 2022-04-06 MED ORDER — TRAZODONE HCL 50 MG PO TABS: 25.0000 mg | ORAL_TABLET | Freq: Every evening | ORAL | Status: DC | PRN

## 2022-04-06 MED ORDER — MAGNESIUM HYDROXIDE 400 MG/5ML PO SUSP: 30.0000 mL | Freq: Every day | ORAL | Status: DC | PRN

## 2022-04-06 MED ORDER — DEXMEDETOMIDINE (PRECEDEX) IN NS 20 MCG/5ML (4 MCG/ML) IV SYRINGE
PREFILLED_SYRINGE | INTRAVENOUS | Status: DC | PRN
  Administered 2022-04-06 (×3): 4 ug via INTRAVENOUS

## 2022-04-06 MED ORDER — WARFARIN - PHARMACIST DOSING INPATIENT
Freq: Every day | Status: DC
  Filled 2022-04-06: qty 1

## 2022-04-06 MED ORDER — MORPHINE SULFATE (PF) 4 MG/ML IV SOLN
4.0000 mg | Freq: Once | INTRAVENOUS | Status: AC
  Administered 2022-04-06: 4 mg via INTRAVENOUS
  Filled 2022-04-06: qty 1

## 2022-04-06 MED ORDER — ACETAMINOPHEN 10 MG/ML IV SOLN
INTRAVENOUS | Status: AC
  Filled 2022-04-06: qty 100

## 2022-04-06 MED ORDER — WARFARIN SODIUM 4 MG PO TABS: 4.0000 mg | ORAL_TABLET | ORAL | Status: DC

## 2022-04-06 MED ORDER — FENTANYL CITRATE (PF) 100 MCG/2ML IJ SOLN
INTRAMUSCULAR | Status: DC | PRN
  Administered 2022-04-06 (×4): 25 ug via INTRAVENOUS

## 2022-04-06 MED ORDER — PROPOFOL 10 MG/ML IV BOLUS
INTRAVENOUS | Status: AC
  Filled 2022-04-06: qty 20

## 2022-04-06 MED ORDER — OXYBUTYNIN CHLORIDE 5 MG PO TABS: 5.0000 mg | ORAL_TABLET | Freq: Three times a day (TID) | ORAL | 0 refills | Status: DC | PRN

## 2022-04-06 MED ORDER — ACETAMINOPHEN 325 MG PO TABS: 650.0000 mg | ORAL_TABLET | Freq: Four times a day (QID) | ORAL | Status: DC | PRN

## 2022-04-06 MED ORDER — ONDANSETRON HCL 4 MG/2ML IJ SOLN
INTRAMUSCULAR | Status: DC | PRN
  Administered 2022-04-06: 4 mg via INTRAVENOUS

## 2022-04-06 MED ORDER — CEFAZOLIN SODIUM-DEXTROSE 2-4 GM/100ML-% IV SOLN: 2.0000 g | Freq: Once | INTRAVENOUS | Status: DC

## 2022-04-06 MED ORDER — DEXAMETHASONE SODIUM PHOSPHATE 10 MG/ML IJ SOLN
INTRAMUSCULAR | Status: AC
  Filled 2022-04-06: qty 1

## 2022-04-06 MED ORDER — DEXAMETHASONE SODIUM PHOSPHATE 10 MG/ML IJ SOLN
INTRAMUSCULAR | Status: DC | PRN
  Administered 2022-04-06: 10 mg via INTRAVENOUS

## 2022-04-06 MED ORDER — SODIUM CHLORIDE 0.9 % IV SOLN: INTRAVENOUS | Status: DC

## 2022-04-06 MED ORDER — CEFAZOLIN SODIUM-DEXTROSE 2-4 GM/100ML-% IV SOLN
INTRAVENOUS | Status: AC
  Filled 2022-04-06: qty 100

## 2022-04-06 MED ORDER — OXYCODONE-ACETAMINOPHEN 5-325 MG PO TABS: 1.0000 | ORAL_TABLET | ORAL | Status: DC | PRN

## 2022-04-06 MED ORDER — CHLORHEXIDINE GLUCONATE 0.12 % MT SOLN
OROMUCOSAL | Status: AC
  Filled 2022-04-06: qty 15

## 2022-04-06 MED ORDER — GABAPENTIN 600 MG PO TABS: 300.0000 mg | ORAL_TABLET | Freq: Two times a day (BID) | ORAL | Status: DC

## 2022-04-06 MED ORDER — PROPOFOL 500 MG/50ML IV EMUL
INTRAVENOUS | Status: AC
  Filled 2022-04-06: qty 50

## 2022-04-06 MED ORDER — VITAMIN B-12 1000 MCG PO TABS
2000.0000 ug | ORAL_TABLET | Freq: Every day | ORAL | Status: DC
  Administered 2022-04-06 – 2022-04-07 (×2): 2000 ug via ORAL
  Filled 2022-04-06 (×2): qty 2

## 2022-04-06 MED ORDER — ACETAMINOPHEN 10 MG/ML IV SOLN
INTRAVENOUS | Status: DC | PRN
  Administered 2022-04-06: 1000 mg via INTRAVENOUS

## 2022-04-06 MED ORDER — ONDANSETRON HCL 4 MG/2ML IJ SOLN
INTRAMUSCULAR | Status: AC
  Filled 2022-04-06: qty 2

## 2022-04-06 MED ORDER — LACTATED RINGERS IV SOLN: INTRAVENOUS | Status: DC | PRN

## 2022-04-06 MED ORDER — PROPOFOL 500 MG/50ML IV EMUL
INTRAVENOUS | Status: DC | PRN
  Administered 2022-04-06: 150 ug/kg/min via INTRAVENOUS

## 2022-04-06 MED ORDER — ROSUVASTATIN CALCIUM 10 MG PO TABS
5.0000 mg | ORAL_TABLET | Freq: Every day | ORAL | Status: DC
  Administered 2022-04-06 – 2022-04-07 (×2): 5 mg via ORAL
  Filled 2022-04-06 (×2): qty 1

## 2022-04-06 MED ORDER — ACETAMINOPHEN 650 MG RE SUPP: 650.0000 mg | Freq: Four times a day (QID) | RECTAL | Status: DC | PRN

## 2022-04-06 MED ORDER — ONDANSETRON HCL 4 MG/2ML IJ SOLN: 4.0000 mg | Freq: Once | INTRAMUSCULAR | Status: DC | PRN

## 2022-04-06 MED ORDER — ONDANSETRON HCL 4 MG PO TABS: 4.0000 mg | ORAL_TABLET | Freq: Four times a day (QID) | ORAL | Status: DC | PRN

## 2022-04-06 MED ORDER — GLUCOSAMINE-CHONDROITIN 250-200 MG PO TABS: ORAL_TABLET | Freq: Every day | ORAL | Status: DC

## 2022-04-06 MED ORDER — DOXYCYCLINE HYCLATE 100 MG PO TABS
100.0000 mg | ORAL_TABLET | Freq: Two times a day (BID) | ORAL | Status: DC
  Administered 2022-04-06 – 2022-04-07 (×3): 100 mg via ORAL
  Filled 2022-04-06 (×3): qty 1

## 2022-04-06 MED ORDER — FENTANYL CITRATE (PF) 100 MCG/2ML IJ SOLN: 25.0000 ug | INTRAMUSCULAR | Status: DC | PRN

## 2022-04-06 MED ORDER — DEXMEDETOMIDINE HCL IN NACL 80 MCG/20ML IV SOLN
INTRAVENOUS | Status: AC
  Filled 2022-04-06: qty 20

## 2022-04-06 MED ORDER — TAMSULOSIN HCL 0.4 MG PO CAPS
0.4000 mg | ORAL_CAPSULE | Freq: Every day | ORAL | Status: DC
  Administered 2022-04-06 – 2022-04-07 (×2): 0.4 mg via ORAL
  Filled 2022-04-06 (×2): qty 1

## 2022-04-06 MED ORDER — ONDANSETRON 4 MG PO TBDP: 4.0000 mg | ORAL_TABLET | Freq: Three times a day (TID) | ORAL | Status: DC | PRN

## 2022-04-06 MED ORDER — FENTANYL CITRATE (PF) 100 MCG/2ML IJ SOLN
INTRAMUSCULAR | Status: AC
Start: 2022-04-06 — End: ?
  Filled 2022-04-06: qty 2

## 2022-04-06 MED ORDER — ONDANSETRON HCL 4 MG/2ML IJ SOLN
4.0000 mg | Freq: Once | INTRAMUSCULAR | Status: AC
  Administered 2022-04-06: 4 mg via INTRAVENOUS
  Filled 2022-04-06: qty 2

## 2022-04-06 SURGICAL SUPPLY — 27 items
ADHESIVE MASTISOL STRL (MISCELLANEOUS) IMPLANT
BAG DRAIN CYSTO-URO LG1000N (MISCELLANEOUS) ×2 IMPLANT
BASKET ZERO TIP 1.9FR (BASKET) IMPLANT
BRUSH SCRUB EZ 1% IODOPHOR (MISCELLANEOUS) ×2 IMPLANT
CATH URET FLEX-TIP 2 LUMEN 10F (CATHETERS) IMPLANT
CATH URETL OPEN 5X70 (CATHETERS) ×2 IMPLANT
CNTNR SPEC 2.5X3XGRAD LEK (MISCELLANEOUS)
CONT SPEC 4OZ STER OR WHT (MISCELLANEOUS)
CONTAINER SPEC 2.5X3XGRAD LEK (MISCELLANEOUS) IMPLANT
DRAPE UTILITY 15X26 TOWEL STRL (DRAPES) ×2 IMPLANT
DRSG TEGADERM 2-3/8X2-3/4 SM (GAUZE/BANDAGES/DRESSINGS) IMPLANT
GLOVE BIO SURGEON STRL SZ 6.5 (GLOVE) ×2 IMPLANT
GOWN STRL REUS W/ TWL LRG LVL3 (GOWN DISPOSABLE) ×2 IMPLANT
GOWN STRL REUS W/TWL LRG LVL3 (GOWN DISPOSABLE) ×2
GUIDEWIRE GREEN .038 145CM (MISCELLANEOUS) ×1 IMPLANT
GUIDEWIRE STR DUAL SENSOR (WIRE) ×3 IMPLANT
IV NS IRRIG 3000ML ARTHROMATIC (IV SOLUTION) ×2 IMPLANT
KIT TURNOVER CYSTO (KITS) ×2 IMPLANT
PACK CYSTO AR (MISCELLANEOUS) ×2 IMPLANT
SET CYSTO W/LG BORE CLAMP LF (SET/KITS/TRAYS/PACK) ×2 IMPLANT
SHEATH NAVIGATOR HD 12/14X36 (SHEATH) IMPLANT
STENT URET 6FRX24 CONTOUR (STENTS) IMPLANT
STENT URET 6FRX26 CONTOUR (STENTS) ×1 IMPLANT
SURGILUBE 2OZ TUBE FLIPTOP (MISCELLANEOUS) ×2 IMPLANT
TRACTIP FLEXIVA PULSE ID 200 (Laser) ×1 IMPLANT
WATER STERILE IRR 1000ML POUR (IV SOLUTION) ×2 IMPLANT
WATER STERILE IRR 500ML POUR (IV SOLUTION) ×2 IMPLANT

## 2022-04-06 NOTE — Progress Notes (Signed)
PHARMACIST - PHYSICIAN ORDER COMMUNICATION  CONCERNING: P&T Medication Policy on Herbal Medications  DESCRIPTION:  This patient's order for:  Glucosamine-Chondroitin 250-200 MG TABS  has been noted.  This product(s) is classified as an "herbal" or natural product. Due to a lack of definitive safety studies or FDA approval, nonstandard manufacturing practices, plus the potential risk of unknown drug-drug interactions while on inpatient medications, the Pharmacy and Therapeutics Committee does not permit the use of "herbal" or natural products of this type within [facility].   ACTION TAKEN: The pharmacy department is unable to verify this order at this time. Please reevaluate patient's clinical condition at discharge and address if the herbal or natural product(s) should be resumed at that time.   Renda Rolls, PharmD, [facility] 04/06/2022 2:39 AM

## 2022-04-06 NOTE — Assessment & Plan Note (Addendum)
Secondary to right ureteral stones with subsequent hydroureteronephrosis.  Pain controlled with medications.  Urology consulted.

## 2022-04-06 NOTE — Assessment & Plan Note (Signed)
Patient felt quite overwhelmed with situation.  He states that normally he is quite a healthy person and has never really had to come into the hospital for something unplanned.  He also felt quite nauseous following pain medication.  He became quite tearful during my evaluation.  We will try some one-time Xanax to see if this helps.  If so, will add as needed.

## 2022-04-06 NOTE — H&P (Signed)
Urology Consult  I have been asked to see the patient by Dr. Sidney Ace, for evaluation and management of right ureteral stones.  Chief Complaint: right flank pain  History of Present Illness: [name] [name] is a 76 y.o. year old M presenting to the emergency room with right ureteral calculi x2 and obstructing hydronephrosis.  He reports that he started having severe pain yesterday.  He presented to the emergency room I was able to get his pain under control and left however he returned very quickly.  He was since been admitted for pain control purposes.  He has been NPO.  His creatinine is elevated and rising.  There is no evidence of infection.  He denies any fevers or chills.  No dysuria. + hematuria.  Ongoing right flank pain.  KUB confirmed retained ureteral calculi.  Does have a personal history of stones but never required intervention.  He is on Coumadin for history of DVT.  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   Prostate cancer ([place]) 2018   Treated with radiation   Rectal bleeding    2013, 2020    Past Surgical History:  Procedure Laterality Date   COLONOSCOPY N/A 07/02/2019   Procedure: COLONOSCOPY;  Surgeon: Lin Landsman, MD;  Location: [facility] ENDOSCOPY;  Service: Gastroenterology;  Laterality: N/A;   COLONOSCOPY     2001, 2011   ESOPHAGOGASTRODUODENOSCOPY N/A 07/02/2019   Procedure: ESOPHAGOGASTRODUODENOSCOPY (EGD);  Surgeon: Lin Landsman, MD;  Location: [facility] ENDOSCOPY;  Service: Gastroenterology;  Laterality: N/A;   ESOPHAGOGASTRODUODENOSCOPY  2013   FOOT FRACTURE SURGERY Right 2012   Heel fracture repair/ has  metal, fell off ladder   HERNIA REPAIR Right 2005   inguinal   IVC FILTER INSERTION N/A 09/23/2020   Procedure: IVC FILTER INSERTION;  Surgeon: Algernon Huxley, MD;  Location: [place] CV LAB;  Service: Cardiovascular;  Laterality: N/A;   IVC FILTER REMOVAL N/A 11/13/2020   Procedure: IVC FILTER REMOVAL;  Surgeon: Algernon Huxley, MD;  Location: [place] CV LAB;  Service: Cardiovascular;  Laterality: N/A;   TONSILLECTOMY     as a child   TOTAL HIP ARTHROPLASTY Right 10/21/2020   Procedure: TOTAL HIP ARTHROPLASTY;  Surgeon: Dereck Leep, MD;  Location: [facility] ORS;  Service: Orthopedics;  Laterality: Right;   XI ROBOTIC ASSISTED INGUINAL HERNIA REPAIR WITH MESH Right 07/18/2021   Procedure: XI ROBOTIC ASSISTED INGUINAL HERNIA REPAIR WITH MESH;  Surgeon: Benjamine Sprague, DO;  Location: [facility] ORS;  Service: General;  Laterality: Right;    Home Medications:  Current Meds  Medication Sig   Cyanocobalamin 2500 MCG SUBL Take 2,500 mcg by mouth daily. Takes at 11 am   doxycycline (VIBRAMYCIN) 100 MG capsule Take 100 mg by mouth 2 (two) times daily.   Glucosamine-Chondroitin (OSTEO BI-FLEX REGULAR STRENGTH PO) Take 2 tablets by mouth daily. Takes at 11 am   ondansetron (ZOFRAN-ODT) 4 MG disintegrating tablet Take 1 tablet (4 mg total) by mouth every 8 (eight) hours as needed for nausea or vomiting.  oxyCODONE-acetaminophen (PERCOCET) 5-325 MG tablet Take 1 tablet by mouth every 4 (four) hours as needed.   rosuvastatin (CRESTOR) 5 MG tablet Take 5 mg by mouth daily.   tamsulosin (FLOMAX) 0.4 MG CAPS capsule Take 1 capsule (0.4 mg total) by mouth daily for 7 days.   triamterene-hydrochlorothiazide (MAXZIDE-25) 37.5-25 MG tablet Take 0.5 tablets by mouth daily. Takes at 11 am   warfarin (COUMADIN) 3 MG tablet Take 3 mg by mouth as directed. Takes on Mondays, Weds., and Fridays   warfarin (COUMADIN) 4 MG tablet Take 4 mg by mouth as directed. Takes on Tues, Thurs, Sat and Sun.    Allergies:   Allergies  Allergen Reactions   Codeine Nausea Only   Sulfa Antibiotics Nausea And Vomiting    Family History  Problem Relation Age of Onset   Heart attack Father     Social History:  reports that he quit smoking about 41 years ago. His smoking use included cigarettes. He has never used smokeless tobacco. He reports that he does not currently use alcohol. He reports that he does not use drugs.  ROS: A complete review of systems was performed.  All systems are negative except for pertinent findings as noted.  Physical Exam:  Vital signs in last 24 hours: Temp:  [97.6 F (36.4 C)-98.3 F (36.8 C)] 97.6 F (36.4 C) (05/22 1520) Pulse Rate:  [65-86] 80 (05/22 1520) Resp:  [0-24] 16 (05/22 1520) BP: (117-177)/(52-84) 156/65 (05/22 1520) SpO2:  [94 %-100 %] 100 % (05/22 1520) Weight:  [95.3 kg] 95.3 kg (05/22 1520) Constitutional:  Alert and oriented, No acute distress.   HEENT: [place] AT, moist mucus membranes.  Trachea midline, no masses Cardiovascular: Regular rate and rhythm, no clubbing, cyanosis, or edema. Respiratory: Normal respiratory effort, lungs clear bilaterally GI: R CVA tenderness Skin: No rashes, bruises or suspicious lesions Neurologic: Grossly intact, no focal deficits, moving all 4 extremities Psychiatric: Normal mood and affect   Laboratory Data:  Recent Labs    04/05/22 0541 04/05/22 1920 04/06/22 0749  WBC 5.7 8.7 8.0  HGB 13.5 13.9 12.4*  HCT 40.8 42.0 38.4*   Recent Labs    04/05/22 0541 04/05/22 1920 04/06/22 0749  NA 140 139 140  K 3.9 3.9 3.9  CL 107 105 107  CO2 '22 24 26  '$ GLUCOSE 121* 119* 103*  BUN 24* 23 27*  CREATININE 1.13 1.47* 1.58*  CALCIUM 8.9 9.5 8.7*   Recent Labs    04/05/22 0541 04/06/22 0107  INR 2.0* 2.0*   No results for input(s): LABURIN in the last 72 hours. Results for orders placed or performed in visit on 01/15/22  Microscopic Examination     Status: Abnormal   Collection Time: 01/15/22 10:27 AM   Urine   Result Value Ref Range Status   WBC, UA None seen 0 - 5 /hpf Final   RBC 0-2 0 - 2 /hpf Final   Epithelial Cells (non renal) 0-10 0 - 10 /hpf Final   Mucus, UA Present (A) Not Estab. Final   Bacteria, UA None seen None seen/Few Final     Radiologic Imaging: DG Abd 1 View  Result Date: 04/06/2022 CLINICAL DATA:  A 76 year old male presents for evaluation of RIGHT-sided flank pain, recent diagnosis of kidney stone. EXAM: ABDOMEN - 1 VIEW COMPARISON:  CT evaluation from Apr 05, 2022. FINDINGS: EKG leads project over the patient's chest and abdomen. Lung bases are clear. Bowel gas pattern without signs of obstruction with scattered stool and  gas throughout the colon. Calcific density projecting just to the RIGHT of L4-5, likely reflects the 2 calculi that were seen in this location on the recent CT evaluation perhaps having migrated very slightly distally on the current exam but still within the middle third of the RIGHT ureter. Signs of RIGHT hip arthroplasty incompletely assessed. No acute skeletal findings on limited assessment. IMPRESSION: 1. Calcific density likely the 2 tiny RIGHT ureteral calculi seen on recent CT imaging may have migrated 1-2 cm but still remain in the mid RIGHT ureter. 2. No signs of bowel obstruction. Electronically Signed   By: Zetta Bills M.D.   On: 04/06/2022 10:14   DG OR UROLOGY CYSTO IMAGE ([facility] ONLY)  Result Date: 04/06/2022 There is no interpretation for this exam.  This order is for images obtained during a surgical procedure.  Please See "Surgeries" Tab for more information regarding the procedure.   CT Renal Stone Study  Result Date: 04/05/2022 CLINICAL DATA:  76 year old male with history of flank pain. Suspected kidney stone. EXAM: CT ABDOMEN AND PELVIS WITHOUT CONTRAST TECHNIQUE: Multidetector CT imaging of the abdomen and pelvis was performed following the standard protocol without IV contrast. RADIATION DOSE REDUCTION: This exam was performed according  to the departmental dose-optimization program which includes automated exposure control, adjustment of the mA and/or kV according to patient size and/or use of iterative reconstruction technique. COMPARISON:  CT the abdomen and pelvis 07/03/2021. CT the pelvis 12/22/2021. FINDINGS: Lower chest: Mild linear scarring throughout the lung bases bilaterally. Atherosclerotic calcifications in the thoracic aorta. Hepatobiliary: Several low-attenuation lesions are noted in the left lobe of the liver, incompletely characterized on today's noncontrast CT examination, but statistically likely to represent cysts, largest of which measures 2.5 x 2.1 cm in the central aspect of segment 2 of the liver. No other definite suspicious hepatic lesions are confidently identified on today's noncontrast CT examination. Unenhanced appearance of the gallbladder is normal. Pancreas: No definite pancreatic mass or peripancreatic fluid collections or inflammatory changes are noted on today's noncontrast CT examination. Spleen: Unremarkable. Adrenals/Urinary Tract: Several tiny 1-2 mm nonobstructive calculi are noted within the collecting systems of both kidneys. In the middle third of the right ureter (coronal image 49 of series 5) there are 2 adjacent 3 mm calculi. No additional calculi are noted elsewhere along the course of the right ureter, within the left ureter, or in the lumen of the urinary bladder. At this time, there is very mild proximal right hydroureteronephrosis. No left hydroureteronephrosis. Unenhanced appearance of the kidneys and bilateral adrenal glands is unremarkable. Urinary bladder is partially obscured by extensive beam hardening artifact from the patient's right hip arthroplasty. Visualized portions of the urinary bladder are unremarkable. Stomach/Bowel: The appearance of the stomach is normal. There is no pathologic dilatation of small bowel or colon. Numerous colonic diverticulae are noted, without surrounding  inflammatory changes to suggest an acute diverticulitis at this time. The appendix is not confidently identified and may be surgically absent. Regardless, there are no inflammatory changes noted adjacent to the cecum to suggest the presence of an acute appendicitis at this time. Vascular/Lymphatic: Atherosclerotic calcifications in the abdominal aorta and pelvic vasculature. Fusiform ectasia of the infrarenal abdominal aorta which measures up to 2.7 cm in diameter. No lymphadenopathy noted in the abdomen or pelvis. Reproductive: Prostate gland and seminal vesicles are largely obscured by beam hardening artifact from the patient's right hip arthroplasty. Other: No significant volume of ascites.  No pneumoperitoneum. Musculoskeletal: Status post right hip arthroplasty. There  are no aggressive appearing lytic or blastic lesions noted in the visualized portions of the skeleton. IMPRESSION: 1. In the middle third of the right ureter there are 2 adjacent 3 mm calculi which are associated with mild proximal right hydroureteronephrosis. 2. Multiple additional 1-2 mm nonobstructive calculi in the collecting systems of both kidneys. 3. Colonic diverticulosis without evidence of acute diverticulitis at this time. 4. 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 Recommend follow-up ultrasound every 5 years. This recommendation follows ACR consensus guidelines: White Paper of the ACR Incidental Findings Committee II on Vascular Findings. J Am Coll Radiol 2013; 10:789-794. Electronically Signed   By: Vinnie Langton M.D.   On: 04/05/2022 06:22    CT personally reviewed, agree with interpretation.  KUB also confirms stones.    Impression/ Plan:  1.  Right ureteral stone x 2-in the setting of failed outpatient management ongoing severe pain, I recommended consideration of intervention.  I offered him ureteroscopy with laser lithotripsy this afternoon.   We discussed the risk including risk of bleeding, infection, damage surrounding structures, need for ureteral stent or need for further procedures along with the success and failure rate.  Risk and benefits were reviewed.  He would like to proceed as planned.  He likely can be discharged after the procedure.  2.  Acute kidney injury-secondary to unilateral obstruction and dehydration, should improve with definitive management of the stone  04/06/2022, 3:43 PM  Hollice Espy,  MD

## 2022-04-06 NOTE — Hospital Course (Signed)
76 year old male with past medical history of hypertension, prostate cancer, hyperlipidemia and DVT presented to the emergency room on the morning of 5/21 with complaints of sudden onset right flank pain and associated hematuria.  In the emergency room, patient found to have AKI with a creatinine of 1.47 and urinalysis noted hematuria and proteinuria.  CT scan noted mild proximal right hydroureteronephrosis caused by 2 small renal calculi in the middle third of the right ureter.  Patient also incidentally noted to have aortic ectasia of the infrarenal abdominal aorta at 2.7 cm.  Patient started on IV fluids and given medication for pain and urology consulted.

## 2022-04-06 NOTE — Anesthesia Preprocedure Evaluation (Signed)
Anesthesia Evaluation  Patient identified by MRN, date of birth, ID band Patient awake    Reviewed: Allergy & Precautions, NPO status , Patient's Chart, lab work & pertinent test results  History of Anesthesia Complications (+) PONV and history of anesthetic complications  Airway Mallampati: II  TM Distance: >3 FB Neck ROM: Full    Dental no notable dental hx.    Pulmonary neg shortness of breath, neg sleep apnea, neg COPD, neg recent URI, former smoker,    breath sounds clear to auscultation- rhonchi (-) wheezing      Cardiovascular hypertension, Pt. 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 left leg No date: GERD (gastroesophageal reflux disease) No date: History of kidney stones No date: Hyperlipemia No date: Hypertension No date: PONV (postoperative nausea and vomiting) 09/23/2020: Presence of IVC filter 2018: Prostate cancer ([place])     Comment:  Treated with radiation No date: Rectal bleeding     Comment:  2013, 2020   Reproductive/Obstetrics                             Anesthesia Physical  Anesthesia Plan  ASA: 2  Anesthesia Plan: General   Post-op Pain Management:    Induction: Intravenous  PONV Risk Score  and Plan: 2 and Ondansetron, Dexamethasone, Propofol infusion and TIVA  Airway Management Planned: LMA  Additional Equipment:   Intra-op Plan:   Post-operative Plan: Extubation in OR  Informed Consent: I have reviewed the patients History and Physical, chart, labs and discussed the procedure including the risks, benefits and alternatives for the proposed anesthesia with the patient or authorized representative who has indicated his/her understanding and acceptance.     Dental advisory given  Plan Discussed with: CRNA and Anesthesiologist  Anesthesia Plan Comments:         Anesthesia Quick Evaluation

## 2022-04-06 NOTE — Discharge Instructions (Signed)
You have a ureteral stent in place.  This is a tube that extends from your kidney to your bladder.  This may cause urinary bleeding, burning with urination, and urinary frequency.  Please call our office or present to the ED if you develop fevers >101 or pain which is not able to be controlled with oral pain medications.  You may be given either Flomax and/ or ditropan to help with bladder spasms and stent pain in addition to pain medications.    [facility] [address], [address] [place], [place] [address] [phone] 

## 2022-04-06 NOTE — ED Provider Notes (Signed)
[facility] Provider Note    Event Date/Time   First MD Initiated Contact with Patient 04/06/22 0013     (approximate)   History   Flank Pain   HPI  ORA MCNATT is a 76 y.o. 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  Was seen here in the emergency department yesterday and was diagnosed with 2 mid ureteral stones and a possible UTI.  Was discharged on cefdinir and given oxycodone for pain.  States he has been taking 5 mg of oxycodone every 4 hours without any relief.  No fevers or chills.  Has had anorexia.  Has been n.p.o. since 11 AM.   History provided by patient and wife.    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   Prostate cancer ([place]) 2018   Treated with radiation   Rectal bleeding    2013, 2020    Past Surgical History:  Procedure Laterality Date   COLONOSCOPY N/A 07/02/2019   Procedure: COLONOSCOPY;  Surgeon: Lin Landsman, MD;  Location: [facility] ENDOSCOPY;  Service: Gastroenterology;  Laterality: N/A;   COLONOSCOPY     2001, 2011   ESOPHAGOGASTRODUODENOSCOPY N/A 07/02/2019   Procedure: ESOPHAGOGASTRODUODENOSCOPY (EGD);  Surgeon: Lin Landsman, MD;  Location: [facility] ENDOSCOPY;  Service: Gastroenterology;  Laterality: N/A;   ESOPHAGOGASTRODUODENOSCOPY  2013   FOOT FRACTURE SURGERY Right 2012   Heel fracture repair/ has metal, fell off ladder   HERNIA REPAIR Right 2005   inguinal   IVC FILTER INSERTION N/A 09/23/2020   Procedure: IVC FILTER INSERTION;  Surgeon:  Algernon Huxley, MD;  Location: [place] CV LAB;  Service: Cardiovascular;  Laterality: N/A;   IVC FILTER REMOVAL N/A 11/13/2020   Procedure: IVC FILTER REMOVAL;  Surgeon: Algernon Huxley, MD;  Location: [place] CV LAB;  Service: Cardiovascular;  Laterality: N/A;   TONSILLECTOMY     as a child   TOTAL HIP ARTHROPLASTY Right 10/21/2020   Procedure: TOTAL HIP ARTHROPLASTY;  Surgeon: Dereck Leep, MD;  Location: [facility] ORS;  Service: Orthopedics;  Laterality: Right;   XI ROBOTIC ASSISTED INGUINAL HERNIA REPAIR WITH MESH Right 07/18/2021   Procedure: XI ROBOTIC ASSISTED INGUINAL HERNIA REPAIR WITH MESH;  Surgeon: Benjamine Sprague, DO;  Location: [facility] ORS;  Service: General;  Laterality: Right;    MEDICATIONS:  Prior to Admission medications   Medication Sig Start Date End Date Taking? Authorizing Provider  Cyanocobalamin 2500 MCG SUBL Take 2,500 mcg by mouth daily. Takes at 11 am   Yes [provider]  doxycycline (VIBRAMYCIN) 100 MG capsule Take 100 mg by mouth 2 (two) times daily. 04/01/22  Yes [provider]  Glucosamine-Chondroitin (OSTEO BI-FLEX REGULAR STRENGTH PO) Take 2 tablets by mouth daily. Takes at 11 am   Yes [provider]  ondansetron (ZOFRAN-ODT) 4 MG disintegrating tablet Take 1 tablet (4 mg total) by mouth every 8 (eight) hours as needed for nausea or vomiting. 04/05/22  Yes  Alfred Levins, [place], MD  oxyCODONE-acetaminophen (PERCOCET) 5-325 MG tablet Take 1 tablet by mouth every 4 (four) hours as needed. 04/05/22  Yes Veronese, [place], MD  rosuvastatin (CRESTOR) 5 MG tablet Take 5 mg by mouth daily. 03/24/22  Yes [provider]  tamsulosin (FLOMAX) 0.4 MG CAPS capsule Take 1 capsule (0.4 mg total) by mouth daily for 7 days. 04/05/22 04/12/22 Yes Veronese, [place], MD  triamterene-hydrochlorothiazide [facility]) 37.5-25 MG tablet Take 0.5 tablets by mouth daily. Takes at 11 am 06/02/17  Yes [provider]  warfarin (COUMADIN) 3 MG tablet  Take 3 mg by mouth as directed. Takes on Mondays, Weds., and Fridays 01/13/22 01/13/23 Yes [provider]  warfarin (COUMADIN) 4 MG tablet Take 4 mg by mouth as directed. Takes on Tues, Thurs, Sat and Sun. 06/19/19 04/06/22 Yes [provider]  cefdinir (OMNICEF) 300 MG capsule Take 1 capsule (300 mg total) by mouth 2 (two) times daily for 10 days. Patient not taking: Reported on 04/06/2022 04/05/22 04/15/22  Arta Silence, MD  gabapentin (NEURONTIN) 600 MG tablet Take 0.5 tablets (300 mg total) by mouth 2 (two) times daily. Patient not taking: Reported on 04/06/2022 01/15/22   Abbie Sons, MD    Physical Exam   Triage Vital Signs: ED Triage Vitals  Enc Vitals Group     BP 04/05/22 1916 (!) 177/84     Pulse Rate 04/05/22 1916 85     Resp 04/05/22 1916 20     Temp 04/05/22 1916 98.3 F (36.8 C)     Temp Source 04/05/22 1916 Oral     SpO2 04/05/22 1916 98 %     Weight 04/05/22 1915 210 lb (95.3 kg)     Height 04/05/22 1915 '5\' 9"'$  (1.753 m)     Head Circumference --      Peak Flow --      Pain Score 04/05/22 1920 10     Pain Loc --      Pain Edu? --      Excl. in [place]? --     Most recent vital signs: Vitals:   04/06/22 0115 04/06/22 0130  BP: (!) 146/73 137/61  Pulse: 86 79  Resp: 13 18  Temp:    SpO2: 100% 97%    CONSTITUTIONAL: Alert and oriented and responds appropriately to questions. Well-appearing; well-nourished HEAD: Normocephalic, atraumatic EYES: Conjunctivae clear, pupils appear equal, sclera nonicteric ENT: normal nose; moist mucous membranes NECK: Supple, normal ROM CARD: RRR; S1 and S2 appreciated; no murmurs, no clicks, no rubs, no gallops RESP: Normal chest excursion without splinting or tachypnea; breath sounds clear and equal bilaterally; no wheezes, no rhonchi, no rales, no hypoxia or respiratory distress, speaking full sentences ABD/GI: Normal bowel sounds; non-distended; soft, non-tender, no rebound, no guarding, no peritoneal  signs BACK: The back appears normal EXT: Normal ROM in all joints; no deformity noted, no edema; no cyanosis SKIN: Normal color for age and race; warm; no rash on exposed skin NEURO: Moves all extremities equally, normal speech PSYCH: The patient's mood and manner are appropriate.   ED Results / Procedures / Treatments   LABS: (all labs ordered are listed, but only abnormal results are displayed) Labs Reviewed  BASIC METABOLIC PANEL - Abnormal; Notable for the following components:      Result Value   Glucose, Bld 119 (*)    Creatinine, Ser 1.47 (*)    GFR, Estimated 49 (*)    All other components within normal limits  PROTIME-INR - Abnormal; Notable  for the following components:   Prothrombin Time 22.5 (*)    INR 2.0 (*)    All other components within normal limits  URINALYSIS, ROUTINE W REFLEX MICROSCOPIC - Abnormal; Notable for the following components:   Color, Urine AMBER (*)    APPearance CLOUDY (*)    Hgb urine dipstick LARGE (*)    Ketones, ur 20 (*)    Protein, ur 30 (*)    RBC / HPF >50 (*)    All other components within normal limits  URINE CULTURE  CBC     EKG:   RADIOLOGY: My personal review and interpretation of imaging: CT scan from yesterday shows 2 mid ureteral stones.  I have personally reviewed all radiology reports.   CT Renal Stone Study  Result Date: 04/05/2022 CLINICAL DATA:  76 year old male with history of flank pain. Suspected kidney stone. EXAM: CT ABDOMEN AND PELVIS WITHOUT CONTRAST TECHNIQUE: Multidetector CT imaging of the abdomen and pelvis was performed following the standard protocol without IV contrast. RADIATION DOSE REDUCTION: This exam was performed according to the departmental dose-optimization program which includes automated exposure control, adjustment of the mA and/or kV according to patient size and/or use of iterative reconstruction technique. COMPARISON:  CT the abdomen and pelvis 07/03/2021. CT the pelvis 12/22/2021.  FINDINGS: Lower chest: Mild linear scarring throughout the lung bases bilaterally. Atherosclerotic calcifications in the thoracic aorta. Hepatobiliary: Several low-attenuation lesions are noted in the left lobe of the liver, incompletely characterized on today's noncontrast CT examination, but statistically likely to represent cysts, largest of which measures 2.5 x 2.1 cm in the central aspect of segment 2 of the liver. No other definite suspicious hepatic lesions are confidently identified on today's noncontrast CT examination. Unenhanced appearance of the gallbladder is normal. Pancreas: No definite pancreatic mass or peripancreatic fluid collections or inflammatory changes are noted on today's noncontrast CT examination. Spleen: Unremarkable. Adrenals/Urinary Tract: Several tiny 1-2 mm nonobstructive calculi are noted within the collecting systems of both kidneys. In the middle third of the right ureter (coronal image 49 of series 5) there are 2 adjacent 3 mm calculi. No additional calculi are noted elsewhere along the course of the right ureter, within the left ureter, or in the lumen of the urinary bladder. At this time, there is very mild proximal right hydroureteronephrosis. No left hydroureteronephrosis. Unenhanced appearance of the kidneys and bilateral adrenal glands is unremarkable. Urinary bladder is partially obscured by extensive beam hardening artifact from the patient's right hip arthroplasty. Visualized portions of the urinary bladder are unremarkable. Stomach/Bowel: The appearance of the stomach is normal. There is no pathologic dilatation of small bowel or colon. Numerous colonic diverticulae are noted, without surrounding inflammatory changes to suggest an acute diverticulitis at this time. The appendix is not confidently identified and may be surgically absent. Regardless, there are no inflammatory changes noted adjacent to the cecum to suggest the presence of an acute appendicitis at this time.  Vascular/Lymphatic: Atherosclerotic calcifications in the abdominal aorta and pelvic vasculature. Fusiform ectasia of the infrarenal abdominal aorta which measures up to 2.7 cm in diameter. No lymphadenopathy noted in the abdomen or pelvis. Reproductive: Prostate gland and seminal vesicles are largely obscured by beam hardening artifact from the patient's right hip arthroplasty. Other: No significant volume of ascites.  No pneumoperitoneum. Musculoskeletal: Status post right hip arthroplasty. There are no aggressive appearing lytic or blastic lesions noted in the visualized portions of the skeleton. IMPRESSION: 1. In the middle third of the right ureter there are  2 adjacent 3 mm calculi which are associated with mild proximal right hydroureteronephrosis. 2. Multiple additional 1-2 mm nonobstructive calculi in the collecting systems of both kidneys. 3. Colonic diverticulosis without evidence of acute diverticulitis at this time. 4. 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 Recommend follow-up ultrasound every 5 years. This recommendation follows ACR consensus guidelines: White Paper of the ACR Incidental Findings Committee II on Vascular Findings. J Am Coll Radiol 2013; 10:789-794. Electronically Signed   By: Vinnie Langton M.D.   On: 04/05/2022 06:22     PROCEDURES:  Critical Care performed: No     Procedures    IMPRESSION / MDM / ASSESSMENT AND PLAN / ED COURSE  I reviewed the triage vital signs and the nursing notes.    Patient here with intractable pain from 2 right-sided kidney stones.     DIFFERENTIAL DIAGNOSIS (includes but not limited to):   Intractable pain from kidney stones, UTI, no signs of sepsis, doubt dissection   PLAN: We will obtain CBC, BMP, urinalysis, urine culture.  Do not feel he needs repeat imaging today.  Given Toradol from triage and states pain is improved but now coming back.  Will  give morphine and Zofran.   MEDICATIONS GIVEN IN ED: Medications  0.9 %  sodium chloride infusion ( Intravenous New Bag/Given 04/06/22 0123)  ketorolac (TORADOL) 15 MG/ML injection 15 mg (15 mg Intramuscular Given 04/05/22 1935)  morphine (PF) 4 MG/ML injection 4 mg (4 mg Intravenous Given 04/06/22 0122)  ondansetron (ZOFRAN) injection 4 mg (4 mg Intravenous Given 04/06/22 0122)     ED COURSE: Patient's labs show no leukocytosis.  He has a mild AKI today with creatinine of 1.47.  Will avoid further nephrotoxic medications.  Urine today shows large amount of red blood cells but no other sign of infection.  Culture is pending.  His INR is therapeutic.  Given intractable pain that is not improving with oral medications at home, did discuss the case with Dr. Claudia Desanctis on-call for urology.  She states she feels it is reasonable to admit him to the hospital service for pain control and then the daytime urologist can see him in the morning to determine if any intervention is needed at that time.  We will keep patient NPO.  Patient and wife are comfortable with this plan.   CONSULTS:  Consulted and discussed patient's case with hospitalist, Dr. Sidney Ace.  I have recommended admission and consulting physician agrees and will place admission orders.  Patient (and family if present) agree with this plan.   I reviewed all nursing notes, vitals, pertinent previous records.  All labs, EKGs, imaging ordered have been independently reviewed and interpreted by myself.    OUTSIDE RECORDS REVIEWED: Reviewed patient's last office visit with Dr. Bernardo Heater on 01/15/2022.         FINAL CLINICAL IMPRESSION(S) / ED DIAGNOSES   Final diagnoses:  Ureteral colic  AKI (acute kidney injury) ([place])     Rx / DC Orders   ED Discharge Orders     None        Note:  This document was prepared using Dragon voice recognition software and may include unintentional dictation errors.   Jarvis Knodel, Delice Bison, DO 04/06/22 [phone]

## 2022-04-06 NOTE — Op Note (Signed)
Date of procedure: 04/06/22  Preoperative diagnosis:  Right mid ureteral calculi Right hydronephrosis Right flank pain Acute kidney injury  Postoperative diagnosis:  Same as above  Procedure: Right ureteroscopy Right ureteral stent placement Interpretation of fluoroscopy less than 30 minutes  Surgeon: Hollice Espy, MD  Anesthesia: General  Complications: None  Intraoperative findings: Mid ureteral stones seen on scout.  Unfortunately, despite multiple techniques including semirigid and flexible ureteroscopy, is unable to advance the scope to the level of the stones.  Some scarring of the distal ureter presumably related to radiation changes.  Trabeculated bladder.  6 x 26 French stent placed without tether with plans for staged procedure.  EBL: Minimal  Specimens: None  Drains: 6 x 26 French double-J ureteral stent on the right  Indication: [name] [name] is a 76 y.o. patient with poorly controlled pain secondary to right mid ureteral calculi along with acute kidney injury.  After reviewing the management options for treatment, he elected to proceed with the above surgical procedure(s). We have discussed the potential benefits and risks of the procedure, side effects of the proposed treatment, the likelihood of the patient achieving the goals of the procedure, and any potential problems that might occur during the procedure or recuperation. Informed consent has been obtained.  Description of procedure:  The patient was taken to the operating room and general anesthesia was induced.  The patient was placed in the dorsal lithotomy position, prepped and draped in the usual sterile fashion, and preoperative antibiotics were administered. A preoperative time-out was performed.   A 21 French scope was advanced per urethra into the bladder.  Notably, the bladder was moderate to heavily trabeculated.  Attention was turned to the right UO.  On scout imaging, the stones in question could be  seen within the mid ureter just above the level of the iliac crest.  A sensor wire was then placed up to the level of the kidney.  I advanced a 4.5 French semirigid ureteroscope alongside the wire but due to scarring and a very narrow distal ureter, had some trouble doing so.  Ended up using a railroad technique and was eventually able to advance it to the mid ureter but just distal to where the stones were located.  I employed multiple initial techniques including exchanging the wire for sensor wire, advancing the scope over a wire itself, attempted using a digital flexible 8 French flexible ureteroscope to no avail.  Ultimately, I elected to place a stent and return for staged procedure.  I backloaded the rigid cystoscope over a 6 x 26 French double-J ureteral stent.  The stent advanced easily and upon wire withdrawal, there was a full coil noted both within the renal pelvis as well as within the bladder.  The bladder was then drained.  The patient was then cleaned and dried, repositioned in supine position, reversed of anesthesia, taken to the PACU in stable condition.  Plan: We will plan for staged outpatient procedure with Dr. Bernardo Heater for definitive management of his right mid ureteral stones.  He may be discharged from a urologic perspective once his pain is adequately controlled.  [facility] have already been sent to pharmacy.  Hollice Espy, M.D.

## 2022-04-06 NOTE — Progress Notes (Signed)
ANTICOAGULATION CONSULT NOTE - Initial Consult  Pharmacy Consult for Warfarin continuation Indication: DVT  Allergies  Allergen Reactions   Codeine Nausea Only   Sulfa Antibiotics Nausea And Vomiting    Patient Measurements: Height: '5\' 9"'$  (175.3 cm) Weight: 95.3 kg (210 lb) IBW/kg (Calculated) : 70.7 Heparin Dosing Weight:    Vital Signs: BP: 153/63 (05/22 0930) Pulse Rate: 72 (05/22 0930)  Labs: Recent Labs    04/05/22 0541 04/05/22 1920 04/06/22 0107 04/06/22 0749  HGB 13.5 13.9  --  12.4*  HCT 40.8 42.0  --  38.4*  PLT 253 276  --  230  LABPROT 22.8*  --  22.5*  --   INR 2.0*  --  2.0*  --   CREATININE 1.13 1.47*  --  1.58*    Estimated Creatinine Clearance: 45.3 mL/min (A) (by C-G formula based on SCr of 1.58 mg/dL (H)).   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 Patient was taking warfarin prior to admission for history of DVT. Pharmacy consulted for management.  INR on admission is 2.0  Goal of Therapy:  INR 2-3 Monitor platelets by anticoagulation protocol: Yes   Plan:  Per MD admission note will hold off on warfarin at this time due to hematuria. Follow up on urology plan and restart of warfarin. Daily INR ordered for monitoring.  Paulina Fusi, PharmD, BCPS 04/06/2022 10:02 AM

## 2022-04-06 NOTE — H&P (Addendum)
[place]   PATIENT NAME: [name] [name]    MR#:  [id number]  DATE OF BIRTH:  31-Mar-1946  DATE OF ADMISSION:  04/06/2022  PRIMARY CARE PHYSICIAN: Adin Hector, MD   Patient is coming from: Home  REQUESTING/REFERRING PHYSICIAN: Ward, Delice Bison, DO  CHIEF COMPLAINT:   Chief Complaint  Patient presents with   Flank Pain    HISTORY OF PRESENT ILLNESS:  [name] [name] is a 75 y.o. Caucasian male with medical history significant for DVT, GERD, hypertension, dyslipidemia and prostate cancer, who presented to the emergency room with acute onset of right flank and right upper quadrant pain with associated hematuria.  He denies any dysuria or oliguria or urinary frequency or urgency.  He denied any other bleeding diathesis.  He has been having nausea and vomiting once.  No fever or chills.  No chest pain or dyspnea or cough or wheezing.  Is being managed for right lower leg cellulitis with p.o. doxycycline.  ED Course: Upon presenting to the emergency room, BP was 154/82 with otherwise normal vital signs.  Labs revealed a creatinine 1.47 up from normal and CBC was within normal.  INR was 2 and PT 22.5.  UA with remarkable for hematuria and proteinuria.  Urine culture was sent. EKG as reviewed by me : EKG showed normal sinus rhythm with rate of 72 with short PR interval and nonspecific intraventricular conduction delay with T wave inversion laterally. Imaging: CT renal stone showed the following: 1. In the middle third of the right ureter there are 2 adjacent 3 mm calculi which are associated with mild proximal right hydroureteronephrosis. 2. Multiple additional 1-2 mm nonobstructive calculi in the collecting systems of both kidneys. 3. Colonic diverticulosis without evidence of acute diverticulitis at this time. 4. 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 Recommend follow-up ultrasound every 5 years. This  recommendation follows ACR consensus guidelines:  The patient was given 4 mg of IV morphine sulfate and 4 mg of IV Zofran as well as 50 mg of IV Toradol.  Dr. Claudia Desanctis was notified about the patient and is aware.  He will be admitted to an observation medical bed for further evaluation and management. 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   Prostate cancer ([place]) 2018   Treated with radiation   Rectal bleeding    2013, 2020    PAST SURGICAL HISTORY:   Past Surgical History:  Procedure Laterality Date   COLONOSCOPY N/A 07/02/2019   Procedure: COLONOSCOPY;  Surgeon: Lin Landsman, MD;  Location: [facility] ENDOSCOPY;  Service: Gastroenterology;  Laterality: N/A;   COLONOSCOPY     2001, 2011   ESOPHAGOGASTRODUODENOSCOPY N/A 07/02/2019   Procedure: ESOPHAGOGASTRODUODENOSCOPY (EGD);  Surgeon: Lin Landsman, MD;  Location: [facility] ENDOSCOPY;  Service: Gastroenterology;  Laterality: N/A;   ESOPHAGOGASTRODUODENOSCOPY  2013   FOOT FRACTURE SURGERY Right 2012   Heel fracture repair/ has metal, fell off ladder   HERNIA REPAIR Right  2005   inguinal   IVC FILTER INSERTION N/A 09/23/2020   Procedure: IVC FILTER INSERTION;  Surgeon: Algernon Huxley, MD;  Location: [place] [facility] LAB;  Service: Cardiovascular;  Laterality: N/A;   IVC FILTER REMOVAL N/A 11/13/2020   Procedure: IVC FILTER REMOVAL;  Surgeon: Algernon Huxley, MD;  Location: [place] [facility];  Service: Cardiovascular;  Laterality: N/A;   TONSILLECTOMY     as a child   TOTAL HIP ARTHROPLASTY Right 10/21/2020   Procedure: TOTAL HIP ARTHROPLASTY;  Surgeon: Dereck Leep,  MD;  Location: [facility] ORS;  Service: Orthopedics;  Laterality: Right;   XI ROBOTIC ASSISTED INGUINAL HERNIA REPAIR WITH MESH Right 07/18/2021   Procedure: XI ROBOTIC ASSISTED INGUINAL HERNIA REPAIR WITH MESH;  Surgeon: Benjamine Sprague, DO;  Location: [facility] ORS;  Service: General;  Laterality: Right;    SOCIAL HISTORY:   Social History   Tobacco Use   Smoking status: Former    Types: Cigarettes    Quit date: 1982    Years since quitting: 41.4   Smokeless tobacco: Never  Substance Use Topics   Alcohol use: Not Currently    FAMILY HISTORY:   Family History  Problem Relation Age of Onset   Heart attack Father     DRUG ALLERGIES:   Allergies  Allergen Reactions   Codeine Nausea Only   Sulfa Antibiotics Nausea And Vomiting    REVIEW OF SYSTEMS:   ROS As per history of present illness. All pertinent systems were reviewed above. Constitutional, HEENT, cardiovascular, respiratory, GI, GU, musculoskeletal, neuro, psychiatric, endocrine, integumentary and hematologic systems were reviewed and are otherwise negative/unremarkable except for positive findings mentioned above in the HPI.   MEDICATIONS AT HOME:   Prior to Admission medications   Medication Sig Start Date End Date Taking? Authorizing Provider  Cyanocobalamin 2500 MCG SUBL Take 2,500 mcg by mouth daily. Takes at 11 am   Yes [provider]  doxycycline (VIBRAMYCIN) 100 MG capsule Take 100 mg by mouth 2 (two) times daily. 04/01/22  Yes [provider]  Glucosamine-Chondroitin (OSTEO BI-FLEX REGULAR STRENGTH PO) Take 2 tablets by mouth daily. Takes at 11 am   Yes [provider]  ondansetron (ZOFRAN-ODT) 4 MG disintegrating tablet Take 1 tablet (4 mg total) by mouth every 8 (eight) hours as needed for nausea or vomiting. 04/05/22  Yes Alfred Levins, [place], MD  oxyCODONE-acetaminophen (PERCOCET) 5-325 MG tablet Take 1 tablet by mouth every 4 (four) hours as needed. 04/05/22  Yes Veronese, [place], MD   rosuvastatin (CRESTOR) 5 MG tablet Take 5 mg by mouth daily. 03/24/22  Yes [provider]  tamsulosin (FLOMAX) 0.4 MG CAPS capsule Take 1 capsule (0.4 mg total) by mouth daily for 7 days. 04/05/22 04/12/22 Yes Veronese, [place], MD  triamterene-hydrochlorothiazide [facility]) 37.5-25 MG tablet Take 0.5 tablets by mouth daily. Takes at 11 am 06/02/17  Yes [provider]  warfarin (COUMADIN) 3 MG tablet Take 3 mg by mouth as directed. Takes on Mondays, Weds., and Fridays 01/13/22 01/13/23 Yes [provider]  warfarin (COUMADIN) 4 MG tablet Take 4 mg by mouth as directed. Takes on Tues, Thurs, Sat and Sun. 06/19/19 04/06/22 Yes [provider]  cefdinir (OMNICEF) 300 MG capsule Take 1 capsule (300 mg total) by mouth 2 (two) times daily for 10 days. Patient not taking: Reported on 04/06/2022 04/05/22 04/15/22  Arta Silence, MD  gabapentin (NEURONTIN) 600 MG tablet Take 0.5 tablets (300 mg total) by mouth 2 (two) times daily. Patient not  taking: Reported on 04/06/2022 01/15/22   Abbie Sons, MD      VITAL SIGNS:  Blood pressure (!) 142/66, pulse 71, temperature 98.3 F (36.8 C), temperature source Oral, resp. rate 16, height '5\' 9"'$  (1.753 m), weight 95.3 kg, SpO2 95 %.  PHYSICAL EXAMINATION:  Physical Exam  GENERAL:  76 y.o.-year-old Caucasian male patient lying in the bed with no acute distress.  EYES: Pupils equal, round, reactive to light and accommodation. No scleral icterus. Extraocular muscles intact.  HEENT: Head atraumatic, normocephalic. Oropharynx and nasopharynx clear.  NECK:  Supple, no jugular venous distention. No thyroid enlargement, no tenderness.  LUNGS: Normal breath sounds bilaterally, no wheezing, rales,rhonchi or crepitation. No use of accessory muscles of respiration.  CARDIOVASCULAR: Regular rate and rhythm, S1, S2 normal. No murmurs, rubs, or gallops.  ABDOMEN: Soft, nondistended, mild right mid abdominal and CVA tenderness.  Bowel  sounds present. No organomegaly or mass.  EXTREMITIES: No pedal edema, cyanosis, or clubbing.  NEUROLOGIC: Cranial nerves II through XII are intact. Muscle strength 5/5 in all extremities. Sensation intact. Gait not checked.  PSYCHIATRIC: The patient is alert and oriented x 3.  Normal affect and good eye contact. SKIN: No obvious rash, lesion, or ulcer.   LABORATORY PANEL:   CBC Recent Labs  Lab 04/05/22 1920  WBC 8.7  HGB 13.9  HCT 42.0  PLT 276   ------------------------------------------------------------------------------------------------------------------  Chemistries  Recent Labs  Lab 04/05/22 0541 04/05/22 1920  NA 140 139  K 3.9 3.9  CL 107 105  CO2 22 24  GLUCOSE 121* 119*  BUN 24* 23  CREATININE 1.13 1.47*  CALCIUM 8.9 9.5  AST 22  --   ALT 18  --   ALKPHOS 64  --   BILITOT 0.9  --    ------------------------------------------------------------------------------------------------------------------  Cardiac Enzymes No results for input(s): TROPONINI in the last 168 hours. ------------------------------------------------------------------------------------------------------------------  RADIOLOGY:  CT Renal Stone Study  Result Date: 04/05/2022 CLINICAL DATA:  76 year old male with history of flank pain. Suspected kidney stone. EXAM: CT ABDOMEN AND PELVIS WITHOUT CONTRAST TECHNIQUE: Multidetector CT imaging of the abdomen and pelvis was performed following the standard protocol without IV contrast. RADIATION DOSE REDUCTION: This exam was performed according to the departmental dose-optimization program which includes automated exposure control, adjustment of the mA and/or kV according to patient size and/or use of iterative reconstruction technique. COMPARISON:  CT the abdomen and pelvis 07/03/2021. CT the pelvis 12/22/2021. FINDINGS: Lower chest: Mild linear scarring throughout the lung bases bilaterally. Atherosclerotic calcifications in the thoracic aorta.  Hepatobiliary: Several low-attenuation lesions are noted in the left lobe of the liver, incompletely characterized on today's noncontrast CT examination, but statistically likely to represent cysts, largest of which measures 2.5 x 2.1 cm in the central aspect of segment 2 of the liver. No other definite suspicious hepatic lesions are confidently identified on today's noncontrast CT examination. Unenhanced appearance of the gallbladder is normal. Pancreas: No definite pancreatic mass or peripancreatic fluid collections or inflammatory changes are noted on today's noncontrast CT examination. Spleen: Unremarkable. Adrenals/Urinary Tract: Several tiny 1-2 mm nonobstructive calculi are noted within the collecting systems of both kidneys. In the middle third of the right ureter (coronal image 49 of series 5) there are 2 adjacent 3 mm calculi. No additional calculi are noted elsewhere along the course of the right ureter, within the left ureter, or in the lumen of the urinary bladder. At this time, there is very mild proximal right hydroureteronephrosis. No left hydroureteronephrosis. Unenhanced appearance  of the kidneys and bilateral adrenal glands is unremarkable. Urinary bladder is partially obscured by extensive beam hardening artifact from the patient's right hip arthroplasty. Visualized portions of the urinary bladder are unremarkable. Stomach/Bowel: The appearance of the stomach is normal. There is no pathologic dilatation of small bowel or colon. Numerous colonic diverticulae are noted, without surrounding inflammatory changes to suggest an acute diverticulitis at this time. The appendix is not confidently identified and may be surgically absent. Regardless, there are no inflammatory changes noted adjacent to the cecum to suggest the presence of an acute appendicitis at this time. Vascular/Lymphatic: Atherosclerotic calcifications in the abdominal aorta and pelvic vasculature. Fusiform ectasia of the infrarenal  abdominal aorta which measures up to 2.7 cm in diameter. No lymphadenopathy noted in the abdomen or pelvis. Reproductive: Prostate gland and seminal vesicles are largely obscured by beam hardening artifact from the patient's right hip arthroplasty. Other: No significant volume of ascites.  No pneumoperitoneum. Musculoskeletal: Status post right hip arthroplasty. There are no aggressive appearing lytic or blastic lesions noted in the visualized portions of the skeleton. IMPRESSION: 1. In the middle third of the right ureter there are 2 adjacent 3 mm calculi which are associated with mild proximal right hydroureteronephrosis. 2. Multiple additional 1-2 mm nonobstructive calculi in the collecting systems of both kidneys. 3. Colonic diverticulosis without evidence of acute diverticulitis at this time. 4. 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 Recommend follow-up ultrasound every 5 years. This recommendation follows ACR consensus guidelines: White Paper of the ACR Incidental Findings Committee II on Vascular Findings. J Am Coll Radiol 2013; 10:789-794. Electronically Signed   By: Vinnie Langton M.D.   On: 04/05/2022 06:22      IMPRESSION AND PLAN:  Assessment and Plan: * Acute unilateral obstructive uropathy - This is clearly secondary to right ureteral stones with subsequent hydroureteronephrosis. - The patient will be admitted to a medical observation bed. - Pain management will be provided. - Urine will be strained. - Urology consult will be obtained. - Dr. Claudia Desanctis was notified about the patient.  AKI (acute kidney injury) ([place]) - This is clearly secondary to #1. - The patient will be hydrated with IV normal saline. - Urine will be strained. - Urology consult will be obtained as mentioned above for potential need for cystoscopy ureteroscopy and stent.  Dyslipidemia - We will continue statin therapy.  Essential  hypertension We will place the patient on p.o. Norvasc and hold off Maxzide given his urolithiasis.  History of DVT (deep vein thrombosis) - Given his hematuria, he is being held off.   DVT prophylaxis: SCDs.  His Coumadin is held off due to hematuria.   Advanced Care Planning:  Code Status: DNR/DNI.  This was discussed with him. Family Communication:  The plan of care was discussed in details with the patient (and family). I answered all questions. The patient agreed to proceed with the above mentioned plan. Further management will depend upon hospital course. Disposition Plan: Back to previous home environment Consults called: Urology All the records are reviewed and case discussed with ED provider.  Status is: Observation  I certify that at the time of admission, it is my clinical judgment that the patient will require  hospital care extending less than 2 midnights.                            Dispo: The patient  is from: Home              Anticipated d/c is to: Home              Patient currently is not medically stable to d/c.              Difficult to place patient: No  Christel Mormon M.D on 04/06/2022 at 5:49 AM  [facility]   From 7 PM-7 AM, contact night-coverage [email or web address]  CC: Primary care physician; Adin Hector, MD

## 2022-04-06 NOTE — Progress Notes (Unsigned)
Surgical Physician Order Form [facility] [place]  * Scheduling expectation :  1-2 weeks with Dr. Bernardo Heater  *Length of Case:   *Clearance needed: no  *Anticoagulation Instructions: May continue all anticoagulants  *Aspirin Instructions: N/A  *Post-op visit Date/Instructions:   TBD  *Diagnosis: Right Ureteral Stone  *Procedure: right  Ureteroscopy w/laser lithotripsy & stent exchange (64290)   Additional orders: N/A  -Admit type: OUTpatient  -Anesthesia: General  -VTE Prophylaxis Standing Order SCD's       Other:   -Standing Lab Orders Per Anesthesia    Lab other: UA&Urine Culture  -Standing Test orders EKG/Chest x-ray per Anesthesia       Test other:   - Medications:  Ancef 2gm IV  -Other orders:  N/A

## 2022-04-06 NOTE — Assessment & Plan Note (Signed)
-   Given his hematuria, he is being held off.

## 2022-04-06 NOTE — Anesthesia Procedure Notes (Signed)
Procedure Name: LMA Insertion Date/Time: 04/06/2022 4:10 PM Performed by: Hedda Slade, CRNA Pre-anesthesia Checklist: Patient identified, Patient being monitored, Timeout performed, Emergency Drugs available and Suction available Patient Re-evaluated:Patient Re-evaluated prior to induction Oxygen Delivery Method: Circle system utilized Preoxygenation: Pre-oxygenation with 100% oxygen Induction Type: IV induction Ventilation: Mask ventilation without difficulty LMA: LMA inserted LMA Size: 4.5 Tube type: Oral Number of attempts: 1 Placement Confirmation: positive ETCO2 and breath sounds checked- equal and bilateral Tube secured with: Tape Dental Injury: Teeth and Oropharynx as per pre-operative assessment

## 2022-04-06 NOTE — Transfer of Care (Signed)
Immediate Anesthesia Transfer of Care Note  Patient: [name] [name]  Procedure(s) Performed: CYSTOSCOPY/URETEROSCOPY/HOLMIUM LASER/STENT PLACEMENT (Right) CYSTOSCOPY WITH RETROGRADE PYELOGRAM (Right)  Patient Location: PACU  Anesthesia Type:General  Level of Consciousness: awake and alert   Airway & Oxygen Therapy: Patient Spontanous Breathing and Patient connected to nasal cannula oxygen  Post-op Assessment: Report given to RN and Post -op Vital signs reviewed and stable  Post vital signs: Reviewed and stable  Last Vitals:  Vitals Value Taken Time  BP 117/55 04/06/22 1658  Temp 36.4 C 04/06/22 1657  Pulse 65 04/06/22 1658  Resp 16 04/06/22 1658  SpO2 100 % 04/06/22 1658    Last Pain:  Vitals:   04/06/22 1658  TempSrc:   PainSc: 0-No pain      Patients Stated Pain Goal: 0 (38/46/65 [id number])  Complications: No notable events documented.

## 2022-04-06 NOTE — Assessment & Plan Note (Signed)
-   We will continue statin therapy. 

## 2022-04-06 NOTE — Assessment & Plan Note (Addendum)
Secondary to renal stones.  Urine strained.  Urology consult for potential cystoscopy and stent placement.  Interestingly, despite IV fluids, creatinine increased 5/22 morning.  Continue fluids and will follow.

## 2022-04-06 NOTE — H&P (View-Only) (Signed)
Urology Consult  I have been asked to see the patient by Dr. Sidney Ace, for evaluation and management of right ureteral stones.  Chief Complaint: right flank pain  History of Present Illness: [name] [name] is a 76 y.o. year old M presenting to the emergency room with right ureteral calculi x2 and obstructing hydronephrosis.  He reports that he started having severe pain yesterday.  He presented to the emergency room I was able to get his pain under control and left however he returned very quickly.  He was since been admitted for pain control purposes.  He has been NPO.  His creatinine is elevated and rising.  There is no evidence of infection.  He denies any fevers or chills.  No dysuria. + hematuria.  Ongoing right flank pain.  KUB confirmed retained ureteral calculi.  Does have a personal history of stones but never required intervention.  He is on Coumadin for history of DVT.  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;  Service: Gastroenterology;  Laterality: N/A;   COLONOSCOPY     2001, 2011   ESOPHAGOGASTRODUODENOSCOPY N/A 07/02/2019   Procedure: ESOPHAGOGASTRODUODENOSCOPY (EGD);  Surgeon: Lin Landsman, MD;  Location: [facility] ENDOSCOPY;  Service: Gastroenterology;  Laterality: N/A;   ESOPHAGOGASTRODUODENOSCOPY  2013   FOOT FRACTURE SURGERY Right 2012   Heel fracture repair/ has  metal, fell off ladder   HERNIA REPAIR Right 2005   inguinal   IVC FILTER INSERTION N/A 09/23/2020   Procedure: IVC FILTER INSERTION;  Surgeon: Algernon Huxley, MD;  Location: [place] [facility];  Service: Cardiovascular;  Laterality: N/A;   IVC FILTER REMOVAL N/A 11/13/2020   Procedure: IVC FILTER REMOVAL;  Surgeon: Algernon Huxley, MD;  Location: [place] [facility];  Service: Cardiovascular;  Laterality: N/A;   TONSILLECTOMY     as a child   TOTAL HIP ARTHROPLASTY Right 10/21/2020   Procedure: TOTAL HIP ARTHROPLASTY;  Surgeon: Dereck Leep, MD;  Location: [facility] ORS;  Service: Orthopedics;  Laterality: Right;   XI ROBOTIC ASSISTED INGUINAL HERNIA REPAIR WITH MESH Right 07/18/2021   Procedure: XI ROBOTIC ASSISTED INGUINAL HERNIA REPAIR WITH MESH;  Surgeon: Benjamine Sprague, DO;  Location: [facility] ORS;  Service: General;  Laterality: Right;    Home Medications:  Current Meds  Medication Sig   Cyanocobalamin 2500 MCG SUBL Take 2,500 mcg by mouth daily. Takes at 11 am   doxycycline (VIBRAMYCIN) 100 MG capsule Take 100 mg by mouth 2 (two) times daily.   Glucosamine-Chondroitin (OSTEO BI-FLEX REGULAR STRENGTH PO) Take 2 tablets by mouth daily. Takes at 11 am   ondansetron (ZOFRAN-ODT) 4 MG disintegrating tablet Take 1 tablet (4 mg total) by mouth every 8 (eight) hours as needed for nausea or vomiting.  oxyCODONE-acetaminophen (PERCOCET) 5-325 MG tablet Take 1 tablet by mouth every 4 (four) hours as needed.   rosuvastatin (CRESTOR) 5 MG tablet Take 5 mg by mouth daily.   tamsulosin (FLOMAX) 0.4 MG CAPS capsule Take 1 capsule (0.4 mg total) by mouth daily for 7 days.   triamterene-hydrochlorothiazide (MAXZIDE-25) 37.5-25 MG tablet Take 0.5 tablets by mouth daily. Takes at 11 am   warfarin (COUMADIN) 3 MG tablet Take 3 mg by mouth as directed. Takes on Mondays, Weds., and Fridays   warfarin (COUMADIN) 4 MG tablet Take 4 mg by mouth as directed. Takes on Tues, Thurs, Sat and Sun.    Allergies:   Allergies  Allergen Reactions   Codeine Nausea Only   Sulfa Antibiotics Nausea And Vomiting    Family History  Problem Relation Age of Onset   Heart attack Father     Social History:  reports that he quit smoking about 41 years ago. His smoking use included cigarettes. He has never used smokeless tobacco. He reports that he does not currently use alcohol. He reports that he does not use drugs.  ROS: A complete review of systems was performed.  All systems are negative except for pertinent findings as noted.  Physical Exam:  Vital signs in last 24 hours: Temp:  [97.6 F (36.4 C)-98.3 F (36.8 C)] 97.6 F (36.4 C) (05/22 1520) Pulse Rate:  [65-86] 80 (05/22 1520) Resp:  [0-24] 16 (05/22 1520) BP: (117-177)/(52-84) 156/65 (05/22 1520) SpO2:  [94 %-100 %] 100 % (05/22 1520) Weight:  [95.3 kg] 95.3 kg (05/22 1520) Constitutional:  Alert and oriented, No acute distress.   HEENT: [place] AT, moist mucus membranes.  Trachea midline, no masses Cardiovascular: Regular rate and rhythm, no clubbing, cyanosis, or edema. Respiratory: Normal respiratory effort, lungs clear bilaterally GI: R CVA tenderness Skin: No rashes, bruises or suspicious lesions Neurologic: Grossly intact, no focal deficits, moving all 4 extremities Psychiatric: Normal mood and affect   Laboratory Data:  Recent Labs    04/05/22 0541 04/05/22 1920 04/06/22 0749  WBC 5.7 8.7 8.0  HGB 13.5 13.9 12.4*  HCT 40.8 42.0 38.4*   Recent Labs    04/05/22 0541 04/05/22 1920 04/06/22 0749  NA 140 139 140  K 3.9 3.9 3.9  CL 107 105 107  CO2 '22 24 26  '$ GLUCOSE 121* 119* 103*  BUN 24* 23 27*  CREATININE 1.13 1.47* 1.58*  CALCIUM 8.9 9.5 8.7*   Recent Labs    04/05/22 0541 04/06/22 0107  INR 2.0* 2.0*   No results for input(s): LABURIN in the last 72 hours. Results for orders placed or performed in visit on 01/15/22  Microscopic Examination     Status: Abnormal   Collection Time: 01/15/22 10:27 AM   Urine   Result Value Ref Range Status   WBC, UA None seen 0 - 5 /hpf Final   RBC 0-2 0 - 2 /hpf Final   Epithelial Cells (non renal) 0-10 0 - 10 /hpf Final   Mucus, UA Present (A) Not Estab. Final   Bacteria, UA None seen None seen/Few Final     Radiologic Imaging: DG Abd 1 View  Result Date: 04/06/2022 CLINICAL DATA:  A 76 year old male presents for evaluation of RIGHT-sided flank pain, recent diagnosis of kidney stone. EXAM: ABDOMEN - 1 VIEW COMPARISON:  CT evaluation from Apr 05, 2022. FINDINGS: EKG leads project over the patient's chest and abdomen. Lung bases are clear. Bowel gas pattern without signs of obstruction with scattered stool and  gas throughout the colon. Calcific density projecting just to the RIGHT of L4-5, likely reflects the 2 calculi that were seen in this location on the recent CT evaluation perhaps having migrated very slightly distally on the current exam but still within the middle third of the RIGHT ureter. Signs of RIGHT hip arthroplasty incompletely assessed. No acute skeletal findings on limited assessment. IMPRESSION: 1. Calcific density likely the 2 tiny RIGHT ureteral calculi seen on recent CT imaging may have migrated 1-2 cm but still remain in the mid RIGHT ureter. 2. No signs of bowel obstruction. Electronically Signed   By: Zetta Bills M.D.   On: 04/06/2022 10:14   DG OR UROLOGY CYSTO IMAGE ([facility] ONLY)  Result Date: 04/06/2022 There is no interpretation for this exam.  This order is for images obtained during a surgical procedure.  Please See "Surgeries" Tab for more information regarding the procedure.   CT Renal Stone Study  Result Date: 04/05/2022 CLINICAL DATA:  76 year old male with history of flank pain. Suspected kidney stone. EXAM: CT ABDOMEN AND PELVIS WITHOUT CONTRAST TECHNIQUE: Multidetector CT imaging of the abdomen and pelvis was performed following the standard protocol without IV contrast. RADIATION DOSE REDUCTION: This exam was performed according  to the departmental dose-optimization program which includes automated exposure control, adjustment of the mA and/or kV according to patient size and/or use of iterative reconstruction technique. COMPARISON:  CT the abdomen and pelvis 07/03/2021. CT the pelvis 12/22/2021. FINDINGS: Lower chest: Mild linear scarring throughout the lung bases bilaterally. Atherosclerotic calcifications in the thoracic aorta. Hepatobiliary: Several low-attenuation lesions are noted in the left lobe of the liver, incompletely characterized on today's noncontrast CT examination, but statistically likely to represent cysts, largest of which measures 2.5 x 2.1 cm in the central aspect of segment 2 of the liver. No other definite suspicious hepatic lesions are confidently identified on today's noncontrast CT examination. Unenhanced appearance of the gallbladder is normal. Pancreas: No definite pancreatic mass or peripancreatic fluid collections or inflammatory changes are noted on today's noncontrast CT examination. Spleen: Unremarkable. Adrenals/Urinary Tract: Several tiny 1-2 mm nonobstructive calculi are noted within the collecting systems of both kidneys. In the middle third of the right ureter (coronal image 49 of series 5) there are 2 adjacent 3 mm calculi. No additional calculi are noted elsewhere along the course of the right ureter, within the left ureter, or in the lumen of the urinary bladder. At this time, there is very mild proximal right hydroureteronephrosis. No left hydroureteronephrosis. Unenhanced appearance of the kidneys and bilateral adrenal glands is unremarkable. Urinary bladder is partially obscured by extensive beam hardening artifact from the patient's right hip arthroplasty. Visualized portions of the urinary bladder are unremarkable. Stomach/Bowel: The appearance of the stomach is normal. There is no pathologic dilatation of small bowel or colon. Numerous colonic diverticulae are noted, without surrounding  inflammatory changes to suggest an acute diverticulitis at this time. The appendix is not confidently identified and may be surgically absent. Regardless, there are no inflammatory changes noted adjacent to the cecum to suggest the presence of an acute appendicitis at this time. Vascular/Lymphatic: Atherosclerotic calcifications in the abdominal aorta and pelvic vasculature. Fusiform ectasia of the infrarenal abdominal aorta which measures up to 2.7 cm in diameter. No lymphadenopathy noted in the abdomen or pelvis. Reproductive: Prostate gland and seminal vesicles are largely obscured by beam hardening artifact from the patient's right hip arthroplasty. Other: No significant volume of ascites.  No pneumoperitoneum. Musculoskeletal: Status post right hip arthroplasty. There  are no aggressive appearing lytic or blastic lesions noted in the visualized portions of the skeleton. IMPRESSION: 1. In the middle third of the right ureter there are 2 adjacent 3 mm calculi which are associated with mild proximal right hydroureteronephrosis. 2. Multiple additional 1-2 mm nonobstructive calculi in the collecting systems of both kidneys. 3. Colonic diverticulosis without evidence of acute diverticulitis at this time. 4. 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 Recommend follow-up ultrasound every 5 years. This recommendation follows ACR consensus guidelines: White Paper of the ACR Incidental Findings Committee II on Vascular Findings. J Am Coll Radiol 2013; 10:789-794. Electronically Signed   By: Vinnie Langton M.D.   On: 04/05/2022 06:22    CT personally reviewed, agree with interpretation.  KUB also confirms stones.    Impression/ Plan:  1.  Right ureteral stone x 2-in the setting of failed outpatient management ongoing severe pain, I recommended consideration of intervention.  I offered him ureteroscopy with laser lithotripsy this afternoon.   We discussed the risk including risk of bleeding, infection, damage surrounding structures, need for ureteral stent or need for further procedures along with the success and failure rate.  Risk and benefits were reviewed.  He would like to proceed as planned.  He likely can be discharged after the procedure.  2.  Acute kidney injury-secondary to unilateral obstruction and dehydration, should improve with definitive management of the stone  04/06/2022, 3:43 PM  Hollice Espy,  MD

## 2022-04-06 NOTE — Assessment & Plan Note (Signed)
Incidental finding.  Patient noted to have aortic ectasia of the infrarenal abdominal aorta at 2.7 cm.  Guidelines at this time recommend a follow-up ultrasound in 5 years.  This is for note on patient's chart and will place in discharge summary for follow-up instructions for his PCP to coordinate.

## 2022-04-06 NOTE — Assessment & Plan Note (Signed)
We will place the patient on p.o. Norvasc and hold off Maxzide given his urolithiasis.

## 2022-04-06 NOTE — ED Notes (Signed)
Pt moved to Pre op at this time.

## 2022-04-06 NOTE — Progress Notes (Signed)
[facility] Progress Note  Patient: [name] [name]    NTI:[id number]  DOA: 04/06/2022    Date of Service: the patient was seen and examined on 04/06/2022  Brief hospital course: 76 year old male with past medical history of hypertension, prostate cancer, hyperlipidemia and DVT presented to the emergency room on the morning of 5/21 with complaints of sudden onset right flank pain and associated hematuria.  In the emergency room, patient found to have AKI with a creatinine of 1.47 and urinalysis noted hematuria and proteinuria.  CT scan noted mild proximal right hydroureteronephrosis caused by 2 small renal calculi in the middle third of the right ureter.  Patient also incidentally noted to have aortic ectasia of the infrarenal abdominal aorta at 2.7 cm.  Patient started on IV fluids and given medication for pain and urology consulted.  Assessment and Plan: Assessment and Plan: * Acute unilateral obstructive uropathy Secondary to right ureteral stones with subsequent hydroureteronephrosis.  Pain controlled with medications.  Urology consulted.  AKI (acute kidney injury) ([place]) Secondary to renal stones.  Urine strained.  Urology consult for potential cystoscopy and stent placement.  Interestingly, despite IV fluids, creatinine increased 5/22 morning.  Continue fluids and will follow.  History of DVT (deep vein thrombosis) - Given his hematuria, he is being held off.  Essential hypertension We will place the patient on p.o. Norvasc and hold off Maxzide given his urolithiasis.  Cancer of prostate with intermediate recurrence risk (stage T2b-c or Gleason 7 or PSA 10-20) ([place]) Does not appear to be any cause for patient's hydronephrosis.  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  Patient noted to have aortic ectasia of the infrarenal abdominal aorta at 2.7 cm.  Guidelines at this time recommend a follow-up ultrasound in 5 years.  This is for note on patient's chart and will place in  discharge summary for follow-up instructions for his PCP to coordinate.  Anxiety Patient felt quite overwhelmed with situation.  He states that normally he is quite a healthy person and has never really had to come into the hospital for something unplanned.  He also felt quite nauseous following pain medication.  He became quite tearful during my evaluation.  We will try some one-time Xanax to see if this helps.  If so, will add as needed.  Dyslipidemia - We will continue statin therapy.  Obesity (BMI 30-39.9) Patient meets criteria with BMI greater than 30       Body mass index is 31.01 kg/m.        Consultants: Urology  Procedures: None  Antimicrobials: None  Code Status: DNR   Subjective: Patient feels somewhat nauseated following pain medication.  He also feels quite tearful and is stressed  Objective: Vital signs were reviewed and unremarkable. Vitals:   04/06/22 1300 04/06/22 1330  BP: (!) 129/57 (!) 121/57  Pulse: 71 65  Resp: 17 14  Temp:    SpO2: 98% 95%   No intake or output data in the 24 hours ending 04/06/22 1354 Filed Weights   04/05/22 1915  Weight: 95.3 kg   Body mass index is 31.01 kg/m.  Exam:  General: Alert and oriented x3, mild distress HEENT: Normocephalic, atraumatic, mucous membranes slightly [place] Cardiovascular: Regular rate and rhythm, S1-S2 Respiratory: Clear to auscultation bilaterally Abdomen: Soft, nontender, nondistended, positive bowel sounds Musculoskeletal: No clubbing or cyanosis or edema Skin: No skin breaks, tears or lesions Psychiatry: Appropriate, no evidence of psychoses Neurology: No focal deficits  Data Reviewed: Creatinine with slight increase from previous  day, currently at 1.58.  No previous history of chronic kidney disease.  Hemoglobin with mild drop to 12.4.  Some of this is from hemoconcentration and some from hematuria.  Disposition:  Status is: Inpatient    Anticipated discharge date:  5/24  Remaining issues to be resolved so that patient can be discharged: Evaluation by urology.  Improvement in renal function and hydronephrosis   Family Communication: Left message for wife DVT Prophylaxis:   SCDs.  Coumadin on hold due to hematuria    Author: Annita Brod ,MD 04/06/2022 1:54 PM  To reach On-call, see care teams to locate the attending and reach out via [email or web address]. Between 7PM-7AM, please contact night-coverage If you still have difficulty reaching the attending provider, please page the [facility] (Director on Call) for [facility] on amion for assistance.

## 2022-04-06 NOTE — Assessment & Plan Note (Signed)
Does not appear to be any cause for patient's hydronephrosis.

## 2022-04-06 NOTE — Assessment & Plan Note (Signed)
Patient meets criteria with BMI greater than 30

## 2022-04-07 ENCOUNTER — Encounter: Payer: Self-pay | Admitting: Urology

## 2022-04-07 DIAGNOSIS — N23 Unspecified renal colic: Secondary | ICD-10-CM | POA: Diagnosis not present

## 2022-04-07 DIAGNOSIS — N1330 Unspecified hydronephrosis: Secondary | ICD-10-CM | POA: Diagnosis not present

## 2022-04-07 DIAGNOSIS — N139 Obstructive and reflux uropathy, unspecified: Secondary | ICD-10-CM | POA: Diagnosis not present

## 2022-04-07 DIAGNOSIS — Z86718 Personal history of other venous thrombosis and embolism: Secondary | ICD-10-CM | POA: Diagnosis not present

## 2022-04-07 DIAGNOSIS — N179 Acute kidney failure, unspecified: Secondary | ICD-10-CM | POA: Diagnosis not present

## 2022-04-07 DIAGNOSIS — N201 Calculus of ureter: Secondary | ICD-10-CM | POA: Diagnosis not present

## 2022-04-07 LAB — URINE CULTURE: Culture: NO GROWTH

## 2022-04-07 LAB — PROTIME-INR
INR: 2.2 — ABNORMAL HIGH (ref 0.8–1.2)
Prothrombin Time: 24.2 seconds — ABNORMAL HIGH (ref 11.4–15.2)

## 2022-04-07 MED ORDER — DOXYCYCLINE HYCLATE 100 MG PO TABS
100.0000 mg | ORAL_TABLET | Freq: Two times a day (BID) | ORAL | 0 refills | Status: DC
Start: 2022-04-07 — End: 2022-04-07

## 2022-04-07 MED ORDER — OXYBUTYNIN CHLORIDE 5 MG PO TABS
5.0000 mg | ORAL_TABLET | Freq: Three times a day (TID) | ORAL | Status: DC | PRN
  Administered 2022-04-07: 5 mg via ORAL
  Filled 2022-04-07: qty 1

## 2022-04-07 NOTE — Plan of Care (Signed)
  Problem: Education: Goal: Knowledge of General Education information will improve Description: Including pain rating scale, medication(s)/side effects and non-pharmacologic comfort measures 04/07/2022 0934 by Evelena Peat, RN Outcome: Completed/Met 04/07/2022 0934 by Evelena Peat, RN Outcome: Progressing   Problem: Health Behavior/Discharge Planning: Goal: Ability to manage health-related needs will improve 04/07/2022 0934 by Evelena Peat, RN Outcome: Completed/Met 04/07/2022 0934 by Evelena Peat, RN Outcome: Progressing   Problem: Clinical Measurements: Goal: Ability to maintain clinical measurements within normal limits will improve 04/07/2022 0934 by Evelena Peat, RN Outcome: Completed/Met 04/07/2022 0934 by Evelena Peat, RN Outcome: Progressing Goal: Will remain free from infection 04/07/2022 0934 by Evelena Peat, RN Outcome: Completed/Met 04/07/2022 0934 by Evelena Peat, RN Outcome: Progressing Goal: Diagnostic test results will improve 04/07/2022 0934 by Evelena Peat, RN Outcome: Completed/Met 04/07/2022 0934 by Evelena Peat, RN Outcome: Progressing Goal: Respiratory complications will improve 04/07/2022 0934 by Evelena Peat, RN Outcome: Completed/Met 04/07/2022 0934 by Evelena Peat, RN Outcome: Progressing Goal: Cardiovascular complication will be avoided 04/07/2022 0934 by Evelena Peat, RN Outcome: Completed/Met 04/07/2022 0934 by Evelena Peat, RN Outcome: Progressing

## 2022-04-07 NOTE — Anesthesia Postprocedure Evaluation (Signed)
Anesthesia Post Note  Patient: [name] [name]  Procedure(s) Performed: CYSTOSCOPY/URETEROSCOPY/HOLMIUM LASER/STENT PLACEMENT (Right) CYSTOSCOPY WITH RETROGRADE PYELOGRAM (Right)  Patient location during evaluation: PACU Anesthesia Type: General Level of consciousness: awake and alert Pain management: pain level controlled Vital Signs Assessment: post-procedure vital signs reviewed and stable Respiratory status: spontaneous breathing, nonlabored ventilation, respiratory function stable and patient connected to nasal cannula oxygen Cardiovascular status: blood pressure returned to baseline and stable Postop Assessment: no apparent nausea or vomiting Anesthetic complications: no   No notable events documented.   Last Vitals:  Vitals:   04/06/22 2249 04/07/22 0433  BP: (!) 145/61 (!) 142/52  Pulse: 66 (!) 59  Resp: 18 20  Temp: 36.8 C 36.9 C  SpO2: 96% 96%    Last Pain:  Vitals:   04/07/22 0433  TempSrc: Oral  PainSc:                  Martha Clan

## 2022-04-07 NOTE — Progress Notes (Signed)
Urology Consult Follow Up  Subjective: POD #1 right ureteral stent placement  Patient experiencing stent discomfort symptoms (dysuria, urgency, urge incontinence and hematuria).  VSS afebrile  Anti-infectives: Anti-infectives (From admission, onward)    Start     Dose/Rate Route Frequency Ordered Stop   04/07/22 0000  doxycycline (VIBRA-TABS) 100 MG tablet        100 mg Oral 2 times daily 04/07/22 0741 04/14/22 2359   04/06/22 1503  ceFAZolin (ANCEF) 2-4 GM/100ML-% IVPB       Note to Pharmacy: Herby Abraham W: cabinet override      04/06/22 1503 04/07/22 0314   04/06/22 1500  ceFAZolin (ANCEF) IVPB 2g/100 mL premix        2 g 200 mL/hr over 30 Minutes Intravenous  Once 04/06/22 1452     04/06/22 1000  doxycycline (VIBRA-TABS) tablet 100 mg        100 mg Oral 2 times daily 04/06/22 0221         Current Facility-Administered Medications  Medication Dose Route Frequency Provider Last Rate Last Admin   0.9 %  sodium chloride infusion   Intravenous Continuous Ward, Kristen N, DO 75 mL/hr at 04/07/22 0439 Infusion Verify at 04/07/22 0439   0.9 %  sodium chloride infusion   Intravenous Continuous Mansy, Jan A, MD       acetaminophen (TYLENOL) tablet 650 mg  650 mg Oral Q6H PRN Mansy, Jan A, MD       Or   acetaminophen (TYLENOL) suppository 650 mg  650 mg Rectal Q6H PRN Mansy, Jan A, MD       ceFAZolin (ANCEF) IVPB 2g/100 mL premix  2 g Intravenous Once Hollice Espy, MD       doxycycline (VIBRA-TABS) tablet 100 mg  100 mg Oral BID Mansy, Jan A, MD   100 mg at 04/06/22 2150   magnesium hydroxide (MILK OF MAGNESIA) suspension 30 mL  30 mL Oral Daily PRN Mansy, Jan A, MD       ondansetron [facility]) tablet 4 mg  4 mg Oral Q6H PRN Mansy, Jan A, MD       Or   ondansetron [facility]) injection 4 mg  4 mg Intravenous Q6H PRN Mansy, Jan A, MD   4 mg at 04/06/22 0901   oxybutynin (DITROPAN) tablet 5 mg  5 mg Oral Q8H PRN Hollice Espy, MD       oxyCODONE-acetaminophen (PERCOCET/ROXICET)  5-325 MG per tablet 1 tablet  1 tablet Oral Q4H PRN Mansy, Jan A, MD       rosuvastatin (CRESTOR) tablet 5 mg  5 mg Oral Daily Mansy, Jan A, MD   5 mg at 04/06/22 1008   tamsulosin (FLOMAX) capsule 0.4 mg  0.4 mg Oral Daily Mansy, Jan A, MD   0.4 mg at 04/06/22 1008   traZODone (DESYREL) tablet 25 mg  25 mg Oral QHS PRN Mansy, Jan A, MD       vitamin B-12 (CYANOCOBALAMIN) tablet 2,000 mcg  2,000 mcg Oral Daily Mansy, Jan A, MD   2,000 mcg at 04/06/22 1007   Warfarin - Pharmacist Dosing Inpatient   Does not apply q1600 Renda Rolls, RPH         Objective: Vital signs in last 24 hours: Temp:  [97.5 F (36.4 C)-98.9 F (37.2 C)] 98.5 F (36.9 C) (05/23 0433) Pulse Rate:  [57-84] 59 (05/23 0433) Resp:  [14-24] 20 (05/23 0433) BP: (117-163)/(43-76) 142/52 (05/23 0433) SpO2:  [92 %-100 %] 96 % (05/23 0433)  Weight:  [95.3 kg-97.2 kg] 97.2 kg (05/22 2151)  Intake/Output from previous day: 05/22 0701 - 05/23 0700 In: 2642.5 [I.V.:2542.5; IV Piggyback:100] Out: 6195 [Urine:1650] Intake/Output this shift: No intake/output data recorded.   Physical Exam Constitutional:  Well nourished. Alert and oriented, No acute distress. HEENT: [place] AT, moist mucus membranes.  Trachea midline Cardiovascular: No clubbing, cyanosis, or edema. Respiratory: Normal respiratory effort, no increased work of breathing. GU: No CVA tenderness.  No bladder fullness or masses.   Neurologic: Grossly intact, no focal deficits, moving all 4 extremities. Psychiatric: Normal mood and affect.   Lab Results:  Recent Labs    04/05/22 1920 04/06/22 0749  WBC 8.7 8.0  HGB 13.9 12.4*  HCT 42.0 38.4*  PLT 276 230   BMET Recent Labs    04/05/22 1920 04/06/22 0749  NA 139 140  K 3.9 3.9  CL 105 107  CO2 24 26  GLUCOSE 119* 103*  BUN 23 27*  CREATININE 1.47* 1.58*  CALCIUM 9.5 8.7*   PT/INR Recent Labs    04/06/22 0107 04/07/22 0419  LABPROT 22.5* 24.2*  INR 2.0* 2.2*   ABG No results for  input(s): PHART, HCO3 in the last 72 hours.  Invalid input(s): PCO2, PO2  Studies/Results: DG Abd 1 View  Result Date: 04/06/2022 CLINICAL DATA:  A 76 year old male presents for evaluation of RIGHT-sided flank pain, recent diagnosis of kidney stone. EXAM: ABDOMEN - 1 VIEW COMPARISON:  CT evaluation from Apr 05, 2022. FINDINGS: EKG leads project over the patient's chest and abdomen. Lung bases are clear. Bowel gas pattern without signs of obstruction with scattered stool and gas throughout the colon. Calcific density projecting just to the RIGHT of L4-5, likely reflects the 2 calculi that were seen in this location on the recent CT evaluation perhaps having migrated very slightly distally on the current exam but still within the middle third of the RIGHT ureter. Signs of RIGHT hip arthroplasty incompletely assessed. No acute skeletal findings on limited assessment. IMPRESSION: 1. Calcific density likely the 2 tiny RIGHT ureteral calculi seen on recent CT imaging may have migrated 1-2 cm but still remain in the mid RIGHT ureter. 2. No signs of bowel obstruction. Electronically Signed   By: Zetta Bills M.D.   On: 04/06/2022 10:14   DG OR UROLOGY CYSTO IMAGE ([facility] ONLY)  Result Date: 04/06/2022 There is no interpretation for this exam.  This order is for images obtained during a surgical procedure.  Please See "Surgeries" Tab for more information regarding the procedure.     Assessment and Plan: 76 year old male who went under emergent right ureteral stent for intractable renal colic and AKI secondary to right ureteral stones yesterday, Apr 06, 2022, with Dr. Erlene Quan.  Intraoperative findings significant for scarring of the distal right ureter presumably to radiation changes therefore his stones could not be addressed at the time of the procedure and will need to be addressed at a later date.  Recommendations: -Stable for discharge from urological perspective -Tamsulosin 0.4 mg daily for stent  discomfort -Oxybutynin IR 5 mg 3 times daily for stent discomfort -Advised patient to purchase depends to wear for episodes of urge incontinence to protect clothing -Urine culture is still pending, continue doxycycline 100 mg twice daily until culture results are available -Reviewed typical stent discomfort signs and symptoms -Reviewed return to ED precautions -will return for right ureteroscopy with laser lithotripsy and ureteral stent exchange in 1 to 2 weeks with Dr. Bernardo Heater orders have been sent -  His wife was also on speaker phone during the conversation     LOS: 1 day    [facility] 04/07/2022

## 2022-04-07 NOTE — Discharge Summary (Signed)
Physician Discharge Summary   Patient: [name] [name] MRN: [id number] DOB: 11-11-1946  Admit date:     04/06/2022  Discharge date: 04/07/22  Discharge Physician: Max Sane   PCP: Adin Hector, MD   Recommendations at discharge:   Follow-up with outpatient providers as requested Recheck BMP in PT/INR in 48 hours with results to PCP and urology for evaluation of kidney function and adjustment in Coumadin if need  Discharge Diagnoses: Principal Problem:   Acute unilateral obstructive uropathy Active Problems:   AKI (acute kidney injury) ([place])   History of DVT (deep vein thrombosis)   Essential hypertension   Cancer of prostate with intermediate recurrence risk (stage T2b-c or Gleason 7 or PSA 10-20) (HCC)   Abdominal aortic ectasia (HCC)   Anxiety   Dyslipidemia   Obesity (BMI 30-39.9)   Ureteral colic  Hospital Course: 76 year old male with past medical history of hypertension, prostate cancer, hyperlipidemia and DVT presented to the emergency room on the morning of 5/21 with complaints of sudden onset right flank pain and associated hematuria.  In the emergency room, patient found to have AKI with a creatinine of 1.47 and urinalysis noted hematuria and proteinuria.  CT scan noted mild proximal right hydroureteronephrosis caused by 2 small renal calculi in the middle third of the right ureter.  Patient also incidentally noted to have aortic ectasia of the infrarenal abdominal aorta at 2.7 cm.  Patient started on IV fluids and given medication for pain and urology consulted.  Assessment and Plan: * Acute unilateral obstructive uropathy Secondary to right ureteral stones with subsequent hydroureteronephrosis.   S/p right ureteral stent placement on 5/22 by Dr. Erlene Quan Intraoperative findings significant for scarring of the distal right ureter likely due to radiation Patient experiencing stent discomfort symptoms (urinary urgency, dysuria, urge incontinence and hematuria) but remains  afebrile and urine culture has no growth -After discussion with patient, wife and urology team/[place] -patient is being discharged home in stable condition -He is requested to take tamsulosin and oxybutynin as prescribed for stent discomfort symptoms. -Other urology specific instructions are given by Larene Beach and patient is in agreement. -He is to follow-up with Dr. Bernardo Heater in 1 to 2 weeks for right ureteroscopy with laser lithotripsy and ureteral stent exchange  AKI (acute kidney injury) ([place]) Likely postobstructive secondary to renal stones.  Now stent placed, so his kidney function should continue to improve. Patient is wanting to leave and understand the risk for worsening kidney function.  He is encouraged to drink liquids and stay hydrated He wants to attend his grandson's function and prefers to leave and have outpatient follow-up with PCP and urology. - I recommend kidney function recheck at close follow-up upon discharge  History of DVT (deep vein thrombosis) - Can resume Coumadin at discharge.  INR 2.2. -His hematuria had improved with stent placement.  I still expect hematuria to continue for a while but should improve over time  Essential hypertension Cancer of prostate with intermediate recurrence risk (stage T2b-c or Gleason 7 or PSA 10-20) ([place]) Does not appear to be any cause for patient's hydronephrosis.  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  Patient noted to have aortic ectasia of the infrarenal abdominal aorta at 2.7 cm.  Guidelines at this time recommend a follow-up ultrasound in 5 years.  This is for note on patient's chart and will place in discharge summary for follow-up instructions for his PCP to coordinate.  Anxiety As he normally remains very healthy.  Explained what to  expect with the procedure and his disease to him and his wife with urology PA at bedside.  He prefers to go home as he could not sleep here in the hospital and wants to see his  family.  Dyslipidemia - continue statin therapy.  Obesity (BMI 30-39.9) Patient meets criteria with BMI greater than 30         Consultants: Urology Procedures performed: Right ureteral stent placement on 5/22 Disposition: Home Diet recommendation:  Discharge Diet Orders (From admission, onward)     Start     Ordered   04/07/22 0000  Diet - low sodium heart healthy        04/07/22 0741           Carb modified diet DISCHARGE MEDICATION: Allergies as of 04/07/2022       Reactions   Codeine Nausea Only   Sulfa Antibiotics Nausea And Vomiting        Medication List     STOP taking these medications    cefdinir 300 MG capsule Commonly known as: OMNICEF   doxycycline 100 MG capsule Commonly known as: VIBRAMYCIN   gabapentin 600 MG tablet Commonly known as: NEURONTIN       TAKE these medications    Cyanocobalamin 2500 MCG Subl Take 2,500 mcg by mouth daily. Takes at 11 am   ondansetron 4 MG disintegrating tablet Commonly known as: ZOFRAN-ODT Take 1 tablet (4 mg total) by mouth every 8 (eight) hours as needed for nausea or vomiting.   OSTEO BI-FLEX REGULAR STRENGTH PO Take 2 tablets by mouth daily. Takes at 11 am   oxybutynin 5 MG tablet Commonly known as: DITROPAN Take 1 tablet (5 mg total) by mouth every 8 (eight) hours as needed for bladder spasms.   oxyCODONE-acetaminophen 5-325 MG tablet Commonly known as: Percocet Take 1 tablet by mouth every 4 (four) hours as needed.   rosuvastatin 5 MG tablet Commonly known as: CRESTOR Take 5 mg by mouth daily.   tamsulosin 0.4 MG Caps capsule Commonly known as: FLOMAX Take 1 capsule (0.4 mg total) by mouth daily for 7 days.   triamterene-hydrochlorothiazide 37.5-25 MG tablet Commonly known as: MAXZIDE-25 Take 0.5 tablets by mouth daily. Takes at 11 am   warfarin 4 MG tablet Commonly known as: COUMADIN Take 4 mg by mouth as directed. Takes on Tues, Thurs, Sat and Sun.   warfarin 3 MG  tablet Commonly known as: COUMADIN Take 3 mg by mouth as directed. Takes on Mondays, Weds., and Fridays        Follow-up Information     Adin Hector, MD. Go on 04/09/2022.   Specialty: Internal Medicine Why: [facility] Discharge F/UP  app time 11:30 Contact information: [place] [address] [phone]         Nori Riis, PA-C. Go on 04/15/2022.   Specialty: Urology Why: [facility] Discharge F/UP   app time 9:30am Contact information: [place] [address] [phone]                Discharge Exam: Danley Danker Weights   04/05/22 1915 04/06/22 1520 04/06/22 2151  Weight: 95.3 kg 95.3 kg 97.2 kg   General: Alert and oriented x3, mild distress HEENT: Normocephalic, atraumatic, mucous membranes slightly [place] Cardiovascular: Regular rate and rhythm, S1-S2 Respiratory: Clear to auscultation bilaterally Abdomen: Soft, nontender, nondistended, positive bowel sounds Musculoskeletal: No clubbing or cyanosis or edema Skin: No skin breaks, tears or  lesions Psychiatry: Appropriate, no evidence of psychoses Neurology: No focal deficits    Condition at discharge: good  The results of significant diagnostics from this hospitalization (including imaging, microbiology, ancillary and laboratory) are listed below for reference.   Imaging Studies: DG Abd 1 View  Result Date: 04/06/2022 CLINICAL DATA:  A 76 year old male presents for evaluation of RIGHT-sided flank pain, recent diagnosis of kidney stone. EXAM: ABDOMEN - 1 VIEW COMPARISON:  CT evaluation from Apr 05, 2022. FINDINGS: EKG leads project over the patient's chest and abdomen. Lung bases are clear. Bowel gas pattern without signs of obstruction with scattered stool and gas throughout the colon. Calcific density projecting just to the RIGHT of L4-5, likely reflects the 2 calculi that were seen in this location on the recent CT  evaluation perhaps having migrated very slightly distally on the current exam but still within the middle third of the RIGHT ureter. Signs of RIGHT hip arthroplasty incompletely assessed. No acute skeletal findings on limited assessment. IMPRESSION: 1. Calcific density likely the 2 tiny RIGHT ureteral calculi seen on recent CT imaging may have migrated 1-2 cm but still remain in the mid RIGHT ureter. 2. No signs of bowel obstruction. Electronically Signed   By: Zetta Bills M.D.   On: 04/06/2022 10:14   DG OR UROLOGY CYSTO IMAGE ([facility] ONLY)  Result Date: 04/06/2022 There is no interpretation for this exam.  This order is for images obtained during a surgical procedure.  Please See "Surgeries" Tab for more information regarding the procedure.   CT Renal Stone Study  Result Date: 04/05/2022 CLINICAL DATA:  76 year old male with history of flank pain. Suspected kidney stone. EXAM: CT ABDOMEN AND PELVIS WITHOUT CONTRAST TECHNIQUE: Multidetector CT imaging of the abdomen and pelvis was performed following the standard protocol without IV contrast. RADIATION DOSE REDUCTION: This exam was performed according to the departmental dose-optimization program which includes automated exposure control, adjustment of the mA and/or kV according to patient size and/or use of iterative reconstruction technique. COMPARISON:  CT the abdomen and pelvis 07/03/2021. CT the pelvis 12/22/2021. FINDINGS: Lower chest: Mild linear scarring throughout the lung bases bilaterally. Atherosclerotic calcifications in the thoracic aorta. Hepatobiliary: Several low-attenuation lesions are noted in the left lobe of the liver, incompletely characterized on today's noncontrast CT examination, but statistically likely to represent cysts, largest of which measures 2.5 x 2.1 cm in the central aspect of segment 2 of the liver. No other definite suspicious hepatic lesions are confidently identified on today's noncontrast CT examination. Unenhanced  appearance of the gallbladder is normal. Pancreas: No definite pancreatic mass or peripancreatic fluid collections or inflammatory changes are noted on today's noncontrast CT examination. Spleen: Unremarkable. Adrenals/Urinary Tract: Several tiny 1-2 mm nonobstructive calculi are noted within the collecting systems of both kidneys. In the middle third of the right ureter (coronal image 49 of series 5) there are 2 adjacent 3 mm calculi. No additional calculi are noted elsewhere along the course of the right ureter, within the left ureter, or in the lumen of the urinary bladder. At this time, there is very mild proximal right hydroureteronephrosis. No left hydroureteronephrosis. Unenhanced appearance of the kidneys and bilateral adrenal glands is unremarkable. Urinary bladder is partially obscured by extensive beam hardening artifact from the patient's right hip arthroplasty. Visualized portions of the urinary bladder are unremarkable. Stomach/Bowel: The appearance of the stomach is normal. There is no pathologic dilatation of small bowel or colon. Numerous colonic diverticulae are noted, without surrounding inflammatory changes to suggest an  acute diverticulitis at this time. The appendix is not confidently identified and may be surgically absent. Regardless, there are no inflammatory changes noted adjacent to the cecum to suggest the presence of an acute appendicitis at this time. Vascular/Lymphatic: Atherosclerotic calcifications in the abdominal aorta and pelvic vasculature. Fusiform ectasia of the infrarenal abdominal aorta which measures up to 2.7 cm in diameter. No lymphadenopathy noted in the abdomen or pelvis. Reproductive: Prostate gland and seminal vesicles are largely obscured by beam hardening artifact from the patient's right hip arthroplasty. Other: No significant volume of ascites.  No pneumoperitoneum. Musculoskeletal: Status post right hip arthroplasty. There are no aggressive appearing lytic or  blastic lesions noted in the visualized portions of the skeleton. IMPRESSION: 1. In the middle third of the right ureter there are 2 adjacent 3 mm calculi which are associated with mild proximal right hydroureteronephrosis. 2. Multiple additional 1-2 mm nonobstructive calculi in the collecting systems of both kidneys. 3. Colonic diverticulosis without evidence of acute diverticulitis at this time. 4. 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 Recommend follow-up ultrasound every 5 years. This recommendation follows ACR consensus guidelines: White Paper of the ACR Incidental Findings Committee II on Vascular Findings. J Am Coll Radiol 2013; 10:789-794. Electronically Signed   By: Vinnie Langton M.D.   On: 04/05/2022 06:22    Microbiology: Results for orders placed or performed during the hospital encounter of 04/06/22  Urine Culture     Status: None   Collection Time: 04/06/22  1:07 AM   Specimen: Urine, Random  Result Value Ref Range Status   Specimen Description   Final    URINE, RANDOM Performed at [facility], [address] [place], [place] [address]    Special Requests   Final    NONE Performed at [facility], [address], [place], [place] [address]    Culture   Final    NO GROWTH Performed at [facility], [place] [address] [place], [place] [address]    Report Status 04/07/2022 FINAL  Final    Labs: CBC: Recent Labs  Lab 04/05/22 0541 04/05/22 1920 04/06/22 0749  WBC 5.7 8.7 8.0  NEUTROABS 2.5  --   --   HGB 13.5 13.9 12.4*  HCT 40.8 42.0 38.4*  MCV 90.5 90.7 91.4  PLT 253 276 914   Basic Metabolic Panel: Recent Labs  Lab 04/05/22 0541 04/05/22 1920 04/06/22 0749  NA 140 139 140  K 3.9 3.9 3.9  CL 107 105 107  CO2 '22 24 26  '$ GLUCOSE 121* 119* 103*  BUN 24* 23 27*  CREATININE 1.13 1.47* 1.58*  CALCIUM 8.9 9.5 8.7*   Liver Function Tests: Recent Labs   Lab 04/05/22 0541  AST 22  ALT 18  ALKPHOS 64  BILITOT 0.9  PROT 6.8  ALBUMIN 3.9   CBG: No results for input(s): GLUCAP in the last 168 hours.  Discharge time spent: greater than 30 minutes.  Signed: Max Sane, MD [facility] 04/07/2022

## 2022-04-07 NOTE — Plan of Care (Signed)

## 2022-04-07 NOTE — Plan of Care (Signed)
  Problem: Clinical Measurements: Goal: Ability to maintain clinical measurements within normal limits will improve Outcome: Progressing Goal: Will remain free from infection Outcome: Progressing Goal: Diagnostic test results will improve Outcome: Progressing Goal: Respiratory complications will improve Outcome: Progressing Goal: Cardiovascular complication will be avoided Outcome: Progressing   Problem: Elimination: Goal: Will not experience complications related to urinary retention Outcome: Progressing   Problem: Pain Managment: Goal: General experience of comfort will improve Outcome: Progressing   Pt is involved in and agrees with the plan of care. V/S stable. Reports urinary frequency, hematuria and dysuria. Provider informed.

## 2022-04-09 ENCOUNTER — Telehealth: Payer: Self-pay

## 2022-04-09 NOTE — Progress Notes (Signed)
[facility] Posting Form   Surgery Date/Time: Date: 04/21/2022  Surgeon: Dr. John Giovanni, MD  Surgery Location: Day Surgery  Inpt ( No  )   Outpt (Yes)   Obs ( No  )   Diagnosis: N20.1 Right Ureteral Stone  -CPT: [phone]  Surgery: Right Ureteroscopy with laser lithotripsy and stent exchange  Stop Anticoagulations: No  Cardiac/Medical/Pulmonary Clearance needed: no  *Orders entered into EPIC  Date: 04/09/22   *Case booked in [place]  Date: 04/09/22  *Notified pt of Surgery: Date: 04/09/22  PRE-OP UA & CX: yes, will obtain at 04/15/2022 office visit  *Placed into Prior Authorization Work Fabio Bering Date: 04/09/22   Assistant/laser/rep:No              .

## 2022-04-09 NOTE — Telephone Encounter (Signed)
I spoke with [name] [name]. We have discussed possible surgery dates and Tuesday June 6th, 2023 was agreed upon by all parties. Patient given information about surgery date, what to expect pre-operatively and post operatively.  We discussed that a Pre-Admission Testing office will be calling to set up the pre-op visit that will take place prior to surgery, and that these appointments are typically done over the phone with a Pre-Admissions RN.  Informed patient that our office will communicate any additional care to be provided after surgery. Patients questions or concerns were discussed during our call. Advised to call our office should there be any additional information, questions or concerns that arise. Patient verbalized understanding.

## 2022-04-14 NOTE — Progress Notes (Unsigned)
04/15/2022 9:55 AM   [name] [name] 05-18-1946 [id number]  Referring provider: Adin Hector, MD [facility] [place],  [place] [address]  Urological history: 1. Prostate cancer -PSA, 12/2021 - 0.11 -cancer of prostate with intermediate recurrence risk (stage T2b-c or Gleason 7 or PSA 10-20) -[place] Localization with radiation, 2018  2. Nephrolithiasis -CT Renal stone study, 03/2022 - In the middle third of the right ureter there are 2 adjacent 3 mm calculi which are associated with mild proximal right hydroureteronephrosis.  Multiple additional 1-2 mm nonobstructive calculi in the collecting systems of both kidneys -urgent right ureteral stent placement for renal colic, 82/99/3716  Chief Complaint  Patient presents with   Nephrolithiasis   HPI: [name] [name] is a 76 y.o. male who presents today to discuss treatment of his kidney stones with his wife, [name].  He was recently admitted for flank pain and went under emergent stenting on Apr 06, 2022 for intractable renal colic.  He has circled frequent urination, urgent urination, burning/painful urination, weak/split urinary stream, lower abdominal pain, genital pain, nausea and constipation on his review of systems sheet.  He took a Dulcolax last night and had to smaller size, firm bowel movements this morning.  He also states that his mouth is extremely Martino.  He has been taking the oxybutynin 3 times daily along with the tamsulosin.  He did start taking the MiraLAX daily, but he only takes it with his coffee.  He and his wife admit he does not drink enough water.  He has not had any fevers, chills or vomiting.  He does have nausea.  He states that nothing tastes good, but he attributes it to his extreme Feeback mouth.  He is also having suprapubic discomfort along with the urgency and dysuria.  He states he feels he would feel better if he could just have a bowel movement.  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   Prostate cancer ([place]) 2018   Treated with radiation   Rectal bleeding    2013, 2020    Surgical History: Past Surgical History:  Procedure Laterality Date   COLONOSCOPY N/A 07/02/2019   Procedure: COLONOSCOPY;  Surgeon: Lin Landsman, MD;  Location: [facility] ENDOSCOPY;  Service: Gastroenterology;  Laterality: N/A;   COLONOSCOPY     2001, 2011   CYSTOSCOPY W/ RETROGRADES Right 04/06/2022   Procedure: CYSTOSCOPY WITH RETROGRADE PYELOGRAM;  Surgeon: Hollice Espy, MD;  Location: [facility] ORS;  Service: Urology;  Laterality: Right;   CYSTOSCOPY/URETEROSCOPY/HOLMIUM LASER/STENT PLACEMENT Right 04/06/2022   Procedure: CYSTOSCOPY/URETEROSCOPY/HOLMIUM LASER/STENT PLACEMENT;  Surgeon: Hollice Espy, MD;  Location: [facility] ORS;  Service: Urology;  Laterality: Right;   ESOPHAGOGASTRODUODENOSCOPY N/A 07/02/2019   Procedure: ESOPHAGOGASTRODUODENOSCOPY (EGD);  Surgeon: Lin Landsman, MD;  Location: [facility] ENDOSCOPY;  Service: Gastroenterology;  Laterality: N/A;   ESOPHAGOGASTRODUODENOSCOPY  2013   FOOT FRACTURE SURGERY Right 2012   Heel fracture repair/ has metal, fell off ladder   HERNIA REPAIR Right 2005   inguinal   IVC FILTER INSERTION N/A 09/23/2020   Procedure: IVC FILTER INSERTION;  Surgeon: Algernon Huxley, MD;  Location: [facility];  Service: Cardiovascular;  Laterality: N/A;   IVC FILTER REMOVAL N/A 11/13/2020   Procedure: IVC FILTER REMOVAL;  Surgeon:  Algernon Huxley, MD;  Location: [facility];  Service: Cardiovascular;  Laterality: N/A;   TONSILLECTOMY     as a child   TOTAL HIP ARTHROPLASTY Right 10/21/2020   Procedure: TOTAL HIP ARTHROPLASTY;  Surgeon: Dereck Leep, MD;  Location: [facility] ORS;  Service: Orthopedics;  Laterality: Right;   XI ROBOTIC ASSISTED INGUINAL HERNIA REPAIR WITH MESH Right 07/18/2021   Procedure: XI ROBOTIC ASSISTED INGUINAL HERNIA REPAIR WITH MESH;  Surgeon: Benjamine Sprague, DO;  Location: [facility] ORS;  Service: General;  Laterality: Right;    Home Medications:  Allergies as of 04/15/2022       Reactions   Codeine Nausea Only   Sulfa Antibiotics Nausea And Vomiting        Medication List        Accurate as of Apr 15, 2022  9:55 AM. If you have any questions, ask your nurse or doctor.          Cyanocobalamin 2500 MCG Subl Take 2,500 mcg by mouth daily. Takes at 11 am   Gemtesa 75 MG Tabs Generic drug: Vibegron Take 75 mg by mouth daily. Started by: Zara Council, PA-C   ondansetron 4 MG disintegrating tablet Commonly known as: ZOFRAN-ODT Take 1 tablet (4 mg total) by mouth every 8 (eight) hours as needed for nausea or vomiting.   OSTEO BI-FLEX REGULAR STRENGTH PO Take 2 tablets by mouth daily. Takes at 11 am   oxybutynin 5 MG tablet Commonly known as: DITROPAN Take 1 tablet (5 mg total) by mouth every 8 (eight) hours as needed for bladder spasms.   oxyCODONE-acetaminophen 5-325 MG tablet Commonly known as: Percocet Take 1 tablet by mouth every 4 (four) hours as needed.   rosuvastatin 5 MG tablet Commonly known as: CRESTOR Take 5 mg by mouth daily.   tamsulosin 0.4 MG Caps capsule Commonly known as: FLOMAX Take 0.4 mg by mouth daily.   triamterene-hydrochlorothiazide 37.5-25 MG tablet Commonly known as: MAXZIDE-25 Take 0.5 tablets by mouth daily. Takes at 11 am   warfarin 4 MG tablet Commonly known as: COUMADIN Take 4 mg by mouth as directed. Takes on Tues, Thurs, Sat and  Sun.   warfarin 3 MG tablet Commonly known as: COUMADIN Take 3 mg by mouth as directed. Takes on Mondays, Weds., and Fridays        Allergies:  Allergies  Allergen Reactions   Codeine Nausea Only   Sulfa Antibiotics Nausea And Vomiting    Family History: Family History  Problem Relation Age of Onset   Heart attack Father     Social History:  reports that he quit smoking about 41 years ago. His smoking use included cigarettes. He has never used smokeless tobacco. He reports that he does not currently use alcohol. He reports that he does not use drugs.  ROS: Pertinent ROS in HPI  Physical Exam: BP (!) 160/82   Pulse 99   Ht '5\' 9"'$  (1.753 m)   Wt 201 lb (91.2 kg)   BMI 29.68 kg/m   Constitutional:  Well nourished. Alert and oriented,  No acute distress. HEENT: [place] AT, moist mucus membranes.  Trachea midline, no masses. Cardiovascular: No clubbing, cyanosis, or edema. Respiratory: Normal respiratory effort, no increased work of breathing. Neurologic: Grossly intact, no focal deficits, moving all 4 extremities. Psychiatric: Normal mood and affect.  Laboratory Data: Lab Results  Component Value Date   WBC 8.0 04/06/2022   HGB 12.4 (L) 04/06/2022   HCT 38.4 (L) 04/06/2022   MCV 91.4 04/06/2022   PLT 230 04/06/2022    Lab Results  Component Value Date   CREATININE 1.58 (H) 04/06/2022    Lab Results  Component Value Date   AST 22 04/05/2022   Lab Results  Component Value Date   ALT 18 04/05/2022   Urinalysis > 30 RBC's  I have reviewed the labs.   Pertinent Imaging: KUB - see Epic and HPI I have independently reviewed the films.  See HPI.    Assessment & Plan:    1. Right ureteral stones - scheduled for a right ureteroscopy with laser lithotripsy and ureteral exchange - explained to the patient how the procedure is performed and the risks involved -informed the patient that they will have an ureteral stent, which will remain in place for  approximately 3-10 days and can be associated with flank pain, bladder pain, dysuria, urgency, frequency, urinary leakage, and gross hematuria. - stent may be removed in the office with a cystoscope or patient may be instructed to remove the stent themselves by the string and that is decided on the day of the procedure - residual stones within the kidney or ureter may be present after the procedure and may need to have these addressed at a different encounter - injury to the ureter is the most common intra-operative risk, it may result in an open procedure to correct the defect - infection and bleeding are also risks - explained the risks of general anesthesia, such as: MI, CVA, paralysis, coma and/or death. -given Gemtesa 75 mg daily to take in place of the oxybutynin IR  -UA -urine culture - advised to contact our office or seek treatment in the ED if becomes febrile or pain/ vomiting are difficult control in order to arrange for emergent/urgent intervention  2. Constipation -Advised the patient to increase his water intake -He stated he was comfortable with enemas, so advised him to attempt a fleets enema in order to facilitate further bowel movements -He will also continue with the nightly Dulcolax   Return for right URS/LL/ureteral stent placement/exchange.  These notes generated with voice recognition software. I apologize for typographical errors.  Zara Council, PA-C  [facility] [address]  [place], [place] [address] [phone]

## 2022-04-15 ENCOUNTER — Ambulatory Visit
Admission: RE | Admit: 2022-04-15 | Discharge: 2022-04-15 | Disposition: A | Discharge status: Home / Self Care | Payer: Medicare HMO | Source: Ambulatory Visit | Attending: Urology | Admitting: Urology

## 2022-04-15 ENCOUNTER — Encounter: Payer: Self-pay | Admitting: Urology

## 2022-04-15 ENCOUNTER — Ambulatory Visit (INDEPENDENT_AMBULATORY_CARE_PROVIDER_SITE_OTHER): Payer: Medicare HMO | Admitting: Urology

## 2022-04-15 ENCOUNTER — Other Ambulatory Visit: Payer: Self-pay | Admitting: Family Medicine

## 2022-04-15 ENCOUNTER — Ambulatory Visit
Admission: RE | Admit: 2022-04-15 | Discharge: 2022-04-15 | Disposition: A | Discharge status: Home / Self Care | Payer: Medicare HMO | Attending: Urology | Admitting: Urology

## 2022-04-15 VITALS — BP 160/82 | HR 99 | Ht 69.0 in | Wt 201.0 lb

## 2022-04-15 DIAGNOSIS — N201 Calculus of ureter: Secondary | ICD-10-CM

## 2022-04-15 DIAGNOSIS — R3915 Urgency of urination: Secondary | ICD-10-CM | POA: Diagnosis not present

## 2022-04-15 LAB — URINALYSIS, COMPLETE
Bilirubin, UA: NEGATIVE
Glucose, UA: NEGATIVE
Nitrite, UA: NEGATIVE
Specific Gravity, UA: 1.03 (ref 1.005–1.030)
Urobilinogen, Ur: 1 mg/dL (ref 0.2–1.0)
pH, UA: 5.5 (ref 5.0–7.5)

## 2022-04-15 LAB — MICROSCOPIC EXAMINATION: RBC, Urine: >30 /hpf — AB (ref 0–2)

## 2022-04-15 MED ORDER — GEMTESA 75 MG PO TABS: 75.0000 mg | ORAL_TABLET | Freq: Every day | ORAL | 0 refills | Status: DC

## 2022-04-16 ENCOUNTER — Encounter
Admission: RE | Admit: 2022-04-16 | Discharge: 2022-04-16 | Disposition: A | Discharge status: Home / Self Care | Payer: Medicare HMO | Source: Ambulatory Visit | Attending: Urology | Admitting: Urology

## 2022-04-16 DIAGNOSIS — Z7901 Long term (current) use of anticoagulants: Secondary | ICD-10-CM

## 2022-04-16 HISTORY — DX: Anemia, unspecified: D64.9

## 2022-04-16 NOTE — Patient Instructions (Addendum)
Your procedure is scheduled on:04-21-22 Tuesday Report to the Registration Desk on the 1st floor of the [facility].Then proceed to the 2nd floor Surgery Desk To find out your arrival time, please call [phone] between 1PM - 3PM on:04-20-22 Monday If your arrival time is 6:00 am, do not arrive prior to that time as the [facility] entrance doors do not open until 6:00 am.  REMEMBER: Instructions that are not followed completely may result in serious medical risk, up to and including death; or upon the discretion of your surgeon and anesthesiologist your surgery may need to be rescheduled.  Do not eat food OR drink any liquids after midnight the night before surgery.  No gum chewing, lozengers or hard candies.  TAKE THESE MEDICATIONS THE MORNING OF SURGERY WITH A SIP OF WATER: -rosuvastatin (CRESTOR) -tamsulosin (FLOMAX)  Continue your warfarin (COUMADIN) up until the day prior to surgery   One week prior to surgery: Stop Anti-inflammatories (NSAIDS) such as Advil, Aleve, Ibuprofen, Motrin, Naproxen, Naprosyn and Aspirin based products such as Excedrin, Goodys Powder, BC Powder.You may however, take Tylenol/Percocet if needed for pain up until the day of surgery. Stop ANY OVER THE COUNTER supplements/vitamins NOW (04-16-22) until after surgery (Vitamin B12 and Glucosamine-Chondroitin)  No Alcohol for 24 hours before or after surgery.  No Smoking including e-cigarettes for 24 hours prior to surgery.  No chewable tobacco products for at least 6 hours prior to surgery.  No nicotine patches on the day of surgery.  Do not use any "recreational" drugs for at least a week prior to your surgery.  Please be advised that the combination of cocaine and anesthesia may have negative outcomes, up to and including death. If you test positive for cocaine, your surgery will be cancelled.  On the morning of surgery brush your teeth with toothpaste and water, you may rinse your mouth with mouthwash if  you wish. Do not swallow any toothpaste or mouthwash.  Do not wear jewelry, make-up, hairpins, clips or nail polish.  Do not wear lotions, powders, or perfumes.   Do not shave body from the neck down 48 hours prior to surgery just in case you cut yourself which could leave a site for infection.  Also, freshly shaved skin may become irritated if using the CHG soap.  Contact lenses, hearing aids and dentures may not be worn into surgery.  Do not bring valuables to the hospital. [facility] is not responsible for any missing/lost belongings or valuables.   Notify your doctor if there is any change in your medical condition (cold, fever, infection).  Wear comfortable clothing (specific to your surgery type) to the hospital.  After surgery, you can help prevent lung complications by doing breathing exercises.  Take deep breaths and cough every 1-2 hours. Your doctor may order a device called an Incentive Spirometer to help you take deep breaths. When coughing or sneezing, hold a pillow firmly against your incision with both hands. This is called "splinting." Doing this helps protect your incision. It also decreases belly discomfort.  If you are being admitted to the hospital overnight, leave your suitcase in the car. After surgery it may be brought to your room.  If you are being discharged the day of surgery, you will not be allowed to drive home. You will need a responsible adult (18 years or older) to drive you home and stay with you that night.   If you are taking public transportation, you will need to have a responsible  adult (18 years or older) with you. Please confirm with your physician that it is acceptable to use public transportation.   Please call the [facility] at [phone] if you have any questions about these instructions.  Surgery Visitation Policy:  Patients undergoing a surgery or procedure may have two family members or support persons with  them as long as the person is not COVID-19 positive or experiencing its symptoms.

## 2022-04-18 LAB — CULTURE, URINE COMPREHENSIVE

## 2022-04-20 ENCOUNTER — Telehealth: Payer: Self-pay

## 2022-04-20 ENCOUNTER — Emergency Department: Payer: Medicare HMO

## 2022-04-20 ENCOUNTER — Encounter: Payer: Self-pay | Admitting: Emergency Medicine

## 2022-04-20 ENCOUNTER — Emergency Department
Admission: EM | Admit: 2022-04-20 | Discharge: 2022-04-20 | Disposition: A | Discharge status: Home / Self Care | Payer: Medicare HMO | Attending: Emergency Medicine | Admitting: Emergency Medicine

## 2022-04-20 ENCOUNTER — Other Ambulatory Visit: Payer: Self-pay

## 2022-04-20 DIAGNOSIS — N23 Unspecified renal colic: Secondary | ICD-10-CM | POA: Insufficient documentation

## 2022-04-20 DIAGNOSIS — R1031 Right lower quadrant pain: Secondary | ICD-10-CM | POA: Diagnosis present

## 2022-04-20 LAB — CBC
HCT: 40.9 % (ref 39.0–52.0)
Hemoglobin: 13.1 g/dL (ref 13.0–17.0)
MCH: 29 pg (ref 26.0–34.0)
MCHC: 32 g/dL (ref 30.0–36.0)
MCV: 90.7 fL (ref 80.0–100.0)
Platelets: 315 10*3/uL (ref 150–400)
RBC: 4.51 MIL/uL (ref 4.22–5.81)
RDW: 12.9 % (ref 11.5–15.5)
WBC: 7 10*3/uL (ref 4.0–10.5)
nRBC: 0 % (ref 0.0–0.2)

## 2022-04-20 LAB — URINALYSIS, ROUTINE W REFLEX MICROSCOPIC
Bacteria, UA: NONE SEEN
RBC / HPF: >50 RBC/hpf — ABNORMAL HIGH (ref 0–5)
Specific Gravity, Urine: 1.032 — ABNORMAL HIGH (ref 1.005–1.030)
Squamous Epithelial / HPF: NONE SEEN (ref 0–5)
WBC, UA: >50 WBC/hpf — ABNORMAL HIGH (ref 0–5)

## 2022-04-20 LAB — BASIC METABOLIC PANEL
Anion gap: 6 (ref 5–15)
BUN: 19 mg/dL (ref 8–23)
CO2: 27 mmol/L (ref 22–32)
Calcium: 9.2 mg/dL (ref 8.9–10.3)
Chloride: 107 mmol/L (ref 98–111)
Creatinine, Ser: 0.95 mg/dL (ref 0.61–1.24)
GFR, Estimated: >60 mL/min (ref >60)
Glucose, Bld: 112 mg/dL — ABNORMAL HIGH (ref 70–99)
Potassium: 3.3 mmol/L — ABNORMAL LOW (ref 3.5–5.1)
Sodium: 140 mmol/L (ref 135–145)

## 2022-04-20 MED ORDER — CHLORHEXIDINE GLUCONATE 0.12 % MT SOLN
15.0000 mL | Freq: Once | OROMUCOSAL | Status: AC
  Administered 2022-04-21: 15 mL via OROMUCOSAL

## 2022-04-20 MED ORDER — KETOROLAC TROMETHAMINE 30 MG/ML IJ SOLN
30.0000 mg | Freq: Once | INTRAMUSCULAR | Status: AC
  Administered 2022-04-20: 30 mg via INTRAVENOUS

## 2022-04-20 MED ORDER — KETOROLAC TROMETHAMINE 30 MG/ML IJ SOLN
INTRAMUSCULAR | Status: AC
  Filled 2022-04-20: qty 1

## 2022-04-20 MED ORDER — LACTATED RINGERS IV SOLN: INTRAVENOUS | Status: DC

## 2022-04-20 MED ORDER — CEFAZOLIN SODIUM-DEXTROSE 2-4 GM/100ML-% IV SOLN
2.0000 g | INTRAVENOUS | Status: AC
  Administered 2022-04-21: 2 g via INTRAVENOUS

## 2022-04-20 MED ORDER — ONDANSETRON HCL 4 MG/2ML IJ SOLN
4.0000 mg | Freq: Once | INTRAMUSCULAR | Status: AC
  Administered 2022-04-20: 4 mg via INTRAVENOUS
  Filled 2022-04-20: qty 2

## 2022-04-20 MED ORDER — FAMOTIDINE 20 MG PO TABS
20.0000 mg | ORAL_TABLET | Freq: Once | ORAL | Status: AC
  Administered 2022-04-21: 20 mg via ORAL

## 2022-04-20 MED ORDER — SODIUM CHLORIDE 0.9 % IV BOLUS
500.0000 mL | Freq: Once | INTRAVENOUS | Status: AC
  Administered 2022-04-20: 500 mL via INTRAVENOUS

## 2022-04-20 MED ORDER — MORPHINE SULFATE (PF) 4 MG/ML IV SOLN
4.0000 mg | Freq: Once | INTRAVENOUS | Status: AC
  Administered 2022-04-20: 4 mg via INTRAVENOUS
  Filled 2022-04-20: qty 1

## 2022-04-20 MED ORDER — ORAL CARE MOUTH RINSE: 15.0000 mL | Freq: Once | OROMUCOSAL | Status: AC

## 2022-04-20 NOTE — ED Triage Notes (Signed)
Pt states history of renal calculi. Pt states he took oxycodone pta, has continued right flank pain. Pt states is scheduled for lithotripsy tomorrow for same.

## 2022-04-20 NOTE — Telephone Encounter (Signed)
-----   Message from Nori Riis, PA-C sent at 04/19/2022  6:11 PM EDT ----- Please let Mr. Capelli know that his urine culture was negative for infection.

## 2022-04-20 NOTE — Telephone Encounter (Signed)
Attempted to reach pt but he could not hear me. Sending a mychart msg instead, see separate encounter.

## 2022-04-20 NOTE — ED Provider Notes (Signed)
[facility] Provider Note    Event Date/Time   First MD Initiated Contact with Patient 04/20/22 0719     (approximate)   History   Flank Pain   HPI  [name] [name] is a 76 y.o. male with a history of kidney stones, had right ureteral stent placed on 522 per review of records.  Scheduled for lithotripsy tomorrow.  Patient reports over the weekend he has been having increasing pain in his right groin, does report some burning with urination but reports he has had that since having the stent.  No fevers reported.  He reports the pain was severe at 4 AM but has improved somewhat     Physical Exam   Triage Vital Signs: ED Triage Vitals [04/20/22 0616]  Enc Vitals Group     BP (!) 142/82     Pulse Rate 81     Resp 16     Temp 98.2 F (36.8 C)     Temp Source Oral     SpO2 97 %     Weight 91 kg (200 lb 9.9 oz)     Height 1.753 m ('5\' 9"'$ )     Head Circumference      Peak Flow      Pain Score 9     Pain Loc      Pain Edu?      Excl. in [place]?     Most recent vital signs: Vitals:   04/20/22 0616  BP: (!) 142/82  Pulse: 81  Resp: 16  Temp: 98.2 F (36.8 C)  SpO2: 97%     General: Awake, no distress.  CV:  Good peripheral perfusion.  Resp:  Normal effort.  Abd:  No distention.  Mild tenderness right lower quadrant, no significant CVA tenderness Other:     ED Results / Procedures / Treatments   Labs (all labs ordered are listed, but only abnormal results are displayed) Labs Reviewed  URINALYSIS, ROUTINE W REFLEX MICROSCOPIC - Abnormal; Notable for the following components:      Result Value   Color, Urine RED (*)    APPearance CLOUDY (*)    Specific Gravity, Urine 1.032 (*)    Glucose, UA   (*)    Value: TEST NOT REPORTED DUE TO COLOR INTERFERENCE OF URINE PIGMENT   Hgb urine dipstick   (*)    Value: TEST NOT REPORTED DUE TO COLOR INTERFERENCE OF URINE PIGMENT   Bilirubin Urine   (*)    Value: TEST NOT REPORTED DUE TO COLOR INTERFERENCE  OF URINE PIGMENT   Ketones, ur   (*)    Value: TEST NOT REPORTED DUE TO COLOR INTERFERENCE OF URINE PIGMENT   Protein, ur   (*)    Value: TEST NOT REPORTED DUE TO COLOR INTERFERENCE OF URINE PIGMENT   Nitrite   (*)    Value: TEST NOT REPORTED DUE TO COLOR INTERFERENCE OF URINE PIGMENT   Leukocytes,Ua   (*)    Value: TEST NOT REPORTED DUE TO COLOR INTERFERENCE OF URINE PIGMENT   RBC / HPF >50 (*)    WBC, UA >50 (*)    All other components within normal limits  BASIC METABOLIC PANEL - Abnormal; Notable for the following components:   Potassium 3.3 (*)    Glucose, Bld 112 (*)    All other components within normal limits  CBC     EKG     RADIOLOGY KUB reviewed by me, right ureteral stent in good  position    PROCEDURES:  Critical Care performed:   Procedures   MEDICATIONS ORDERED IN ED: Medications  ketorolac (TORADOL) 30 MG/ML injection 30 mg (has no administration in time range)  morphine (PF) 4 MG/ML injection 4 mg (4 mg Intravenous Given 04/20/22 0741)  ondansetron (ZOFRAN) injection 4 mg (4 mg Intravenous Given 04/20/22 0741)  sodium chloride 0.9 % bolus 500 mL (0 mLs Intravenous Stopped 04/20/22 0850)     IMPRESSION / MDM / ASSESSMENT AND PLAN / ED COURSE  I reviewed the triage vital signs and the nursing notes. Patient's presentation is most consistent with acute illness / injury with system symptoms.  Patient presents with right lower quadrant pain as above, most certainly related to some combination of ureteral stent with spasm, kidney stones, possibility of infection  We will treat with IV morphine, IV Zofran, IV fluids, obtain KUB and discussed with urology  Lab work reviewed, BMP unremarkable, CBC normal, urinalysis positive RBCs, no bacteria seen.  KUB reassuring.  Discussed with Dr. Erlene Quan of urology who recommends controlling pain, appropriate for discharge with procedure tomorrow, recommends IV Toradol as needed        FINAL CLINICAL IMPRESSION(S)  / ED DIAGNOSES   Final diagnoses:  Ureteral colic     Rx / DC Orders   ED Discharge Orders     None        Note:  This document was prepared using Dragon voice recognition software and may include unintentional dictation errors.   Lavonia Drafts, MD 04/20/22 0900

## 2022-04-21 ENCOUNTER — Other Ambulatory Visit: Payer: Self-pay

## 2022-04-21 ENCOUNTER — Ambulatory Visit: Payer: Medicare HMO | Admitting: Urgent Care

## 2022-04-21 ENCOUNTER — Ambulatory Visit: Payer: Medicare HMO

## 2022-04-21 ENCOUNTER — Encounter: Payer: Self-pay | Admitting: Urology

## 2022-04-21 ENCOUNTER — Ambulatory Visit
Admission: RE | Admit: 2022-04-21 | Discharge: 2022-04-21 | Disposition: A | Discharge status: Home / Self Care | Payer: Medicare HMO | Attending: Urology | Admitting: Urology

## 2022-04-21 ENCOUNTER — Encounter
Admission: RE | Disposition: A | Discharge status: Home / Self Care | Payer: Self-pay | Source: Home / Self Care | Attending: Urology

## 2022-04-21 DIAGNOSIS — I10 Essential (primary) hypertension: Secondary | ICD-10-CM | POA: Diagnosis not present

## 2022-04-21 DIAGNOSIS — N201 Calculus of ureter: Secondary | ICD-10-CM | POA: Diagnosis not present

## 2022-04-21 DIAGNOSIS — Z86718 Personal history of other venous thrombosis and embolism: Secondary | ICD-10-CM | POA: Diagnosis not present

## 2022-04-21 DIAGNOSIS — Z7901 Long term (current) use of anticoagulants: Secondary | ICD-10-CM | POA: Diagnosis not present

## 2022-04-21 DIAGNOSIS — Z923 Personal history of irradiation: Secondary | ICD-10-CM | POA: Diagnosis not present

## 2022-04-21 DIAGNOSIS — N132 Hydronephrosis with renal and ureteral calculous obstruction: Secondary | ICD-10-CM | POA: Diagnosis present

## 2022-04-21 DIAGNOSIS — Z87891 Personal history of nicotine dependence: Secondary | ICD-10-CM | POA: Insufficient documentation

## 2022-04-21 DIAGNOSIS — Z8546 Personal history of malignant neoplasm of prostate: Secondary | ICD-10-CM | POA: Insufficient documentation

## 2022-04-21 DIAGNOSIS — Z87442 Personal history of urinary calculi: Secondary | ICD-10-CM | POA: Insufficient documentation

## 2022-04-21 DIAGNOSIS — K219 Gastro-esophageal reflux disease without esophagitis: Secondary | ICD-10-CM | POA: Insufficient documentation

## 2022-04-21 HISTORY — PX: CYSTOSCOPY/URETEROSCOPY/HOLMIUM LASER/STENT PLACEMENT: SHX6546

## 2022-04-21 LAB — PROTIME-INR
INR: 3.4 — ABNORMAL HIGH (ref 0.8–1.2)
Prothrombin Time: 34.1 seconds — ABNORMAL HIGH (ref 11.4–15.2)

## 2022-04-21 SURGERY — CYSTOSCOPY/URETEROSCOPY/HOLMIUM LASER/STENT PLACEMENT
Anesthesia: General | Site: Ureter | Laterality: Right

## 2022-04-21 MED ORDER — ONDANSETRON HCL 4 MG/2ML IJ SOLN
INTRAMUSCULAR | Status: DC | PRN
  Administered 2022-04-21 (×2): 4 mg via INTRAVENOUS

## 2022-04-21 MED ORDER — EPHEDRINE 5 MG/ML INJ
INTRAVENOUS | Status: AC
Start: 2022-04-21 — End: ?
  Filled 2022-04-21: qty 5

## 2022-04-21 MED ORDER — DEXMEDETOMIDINE (PRECEDEX) IN NS 20 MCG/5ML (4 MCG/ML) IV SYRINGE
PREFILLED_SYRINGE | INTRAVENOUS | Status: DC | PRN
  Administered 2022-04-21 (×6): 4 ug via INTRAVENOUS

## 2022-04-21 MED ORDER — SODIUM CHLORIDE 0.9 % IR SOLN
Status: DC | PRN
  Administered 2022-04-21: 3000 mL via INTRAVESICAL

## 2022-04-21 MED ORDER — CHLORHEXIDINE GLUCONATE 0.12 % MT SOLN
OROMUCOSAL | Status: AC
  Filled 2022-04-21: qty 15

## 2022-04-21 MED ORDER — LIDOCAINE HCL (CARDIAC) PF 100 MG/5ML IV SOSY
PREFILLED_SYRINGE | INTRAVENOUS | Status: DC | PRN
  Administered 2022-04-21: 100 mg via INTRAVENOUS

## 2022-04-21 MED ORDER — ONDANSETRON HCL 4 MG/2ML IJ SOLN
INTRAMUSCULAR | Status: AC
  Filled 2022-04-21: qty 2

## 2022-04-21 MED ORDER — PROPOFOL 10 MG/ML IV BOLUS
INTRAVENOUS | Status: DC | PRN
  Administered 2022-04-21: 20 mg via INTRAVENOUS
  Administered 2022-04-21: 150 mg via INTRAVENOUS

## 2022-04-21 MED ORDER — PROPOFOL 10 MG/ML IV BOLUS
INTRAVENOUS | Status: AC
  Filled 2022-04-21: qty 40

## 2022-04-21 MED ORDER — EPHEDRINE SULFATE (PRESSORS) 50 MG/ML IJ SOLN
INTRAMUSCULAR | Status: DC | PRN
  Administered 2022-04-21: 10 mg via INTRAVENOUS

## 2022-04-21 MED ORDER — FENTANYL CITRATE (PF) 100 MCG/2ML IJ SOLN
INTRAMUSCULAR | Status: AC
  Filled 2022-04-21: qty 2

## 2022-04-21 MED ORDER — CEFAZOLIN SODIUM-DEXTROSE 2-4 GM/100ML-% IV SOLN
INTRAVENOUS | Status: AC
  Filled 2022-04-21: qty 100

## 2022-04-21 MED ORDER — ACETAMINOPHEN 10 MG/ML IV SOLN
INTRAVENOUS | Status: DC | PRN
  Administered 2022-04-21: 1000 mg via INTRAVENOUS

## 2022-04-21 MED ORDER — FAMOTIDINE 20 MG PO TABS
ORAL_TABLET | ORAL | Status: AC
  Filled 2022-04-21: qty 1

## 2022-04-21 MED ORDER — LIDOCAINE HCL (PF) 2 % IJ SOLN
INTRAMUSCULAR | Status: AC
  Filled 2022-04-21: qty 5

## 2022-04-21 MED ORDER — IOHEXOL 180 MG/ML  SOLN
INTRAMUSCULAR | Status: DC | PRN
  Administered 2022-04-21: 10 mL

## 2022-04-21 MED ORDER — ACETAMINOPHEN 10 MG/ML IV SOLN
INTRAVENOUS | Status: AC
  Filled 2022-04-21: qty 100

## 2022-04-21 MED ORDER — PHENYLEPHRINE 80 MCG/ML (10ML) SYRINGE FOR IV PUSH (FOR BLOOD PRESSURE SUPPORT)
PREFILLED_SYRINGE | INTRAVENOUS | Status: AC
  Filled 2022-04-21: qty 10

## 2022-04-21 MED ORDER — DEXAMETHASONE SODIUM PHOSPHATE 10 MG/ML IJ SOLN
INTRAMUSCULAR | Status: AC
  Filled 2022-04-21: qty 1

## 2022-04-21 MED ORDER — DEXAMETHASONE SODIUM PHOSPHATE 10 MG/ML IJ SOLN
INTRAMUSCULAR | Status: DC | PRN
  Administered 2022-04-21: 10 mg via INTRAVENOUS

## 2022-04-21 MED ORDER — FENTANYL CITRATE (PF) 100 MCG/2ML IJ SOLN
INTRAMUSCULAR | Status: DC | PRN
Start: 2022-04-21 — End: 2022-04-21
  Administered 2022-04-21: 25 ug via INTRAVENOUS
  Administered 2022-04-21: 50 ug via INTRAVENOUS
  Administered 2022-04-21: 25 ug via INTRAVENOUS

## 2022-04-21 MED ORDER — FENTANYL CITRATE (PF) 100 MCG/2ML IJ SOLN: 25.0000 ug | INTRAMUSCULAR | Status: DC | PRN

## 2022-04-21 SURGICAL SUPPLY — 25 items
ADH LQ OCL WTPRF AMP STRL LF (MISCELLANEOUS) ×1
ADHESIVE MASTISOL STRL (MISCELLANEOUS) ×1 IMPLANT
BAG DRAIN CYSTO-URO LG1000N (MISCELLANEOUS) ×2 IMPLANT
BASKET ZERO TIP 1.9FR (BASKET) ×1 IMPLANT
BRUSH SCRUB EZ 1% IODOPHOR (MISCELLANEOUS) ×2 IMPLANT
BSKT STON RTRVL ZERO TP 1.9FR (BASKET) ×1
DRAPE UTILITY 15X26 TOWEL STRL (DRAPES) ×2 IMPLANT
DRSG TEGADERM 2-3/8X2-3/4 SM (GAUZE/BANDAGES/DRESSINGS) ×1 IMPLANT
GLOVE SURG UNDER POLY LF SZ7.5 (GLOVE) ×2 IMPLANT
GOWN STRL REUS W/ TWL LRG LVL3 (GOWN DISPOSABLE) ×1 IMPLANT
GOWN STRL REUS W/ TWL XL LVL3 (GOWN DISPOSABLE) ×1 IMPLANT
GOWN STRL REUS W/TWL LRG LVL3 (GOWN DISPOSABLE) ×2
GOWN STRL REUS W/TWL XL LVL3 (GOWN DISPOSABLE) ×2
GUIDEWIRE GREEN .038 145CM (MISCELLANEOUS) ×1 IMPLANT
GUIDEWIRE STR DUAL SENSOR (WIRE) ×3 IMPLANT
IV NS IRRIG 3000ML ARTHROMATIC (IV SOLUTION) ×2 IMPLANT
KIT TURNOVER CYSTO (KITS) ×2 IMPLANT
PACK CYSTO AR (MISCELLANEOUS) ×2 IMPLANT
SET CYSTO W/LG BORE CLAMP LF (SET/KITS/TRAYS/PACK) ×2 IMPLANT
SHEATH NAVIGATOR HD 12/14X36 (SHEATH) IMPLANT
STENT URET 6FRX26 CONTOUR (STENTS) ×1 IMPLANT
SURGILUBE 2OZ TUBE FLIPTOP (MISCELLANEOUS) ×2 IMPLANT
TRACTIP FLEXIVA PULSE ID 200 (Laser) ×2 IMPLANT
VALVE UROSEAL ADJ ENDO (VALVE) ×1 IMPLANT
WATER STERILE IRR 500ML POUR (IV SOLUTION) ×2 IMPLANT

## 2022-04-21 NOTE — Op Note (Signed)
Preoperative diagnosis: Right ureteral calculi  Postoperative diagnosis: Right ureteral calculi  Procedure:  Cystoscopy Right ureteroscopy and stone removal Ureteroscopic laser lithotripsy Right ureteral stent exchange (92F/26 cm)   Surgeon: Nicki Reaper C. Frazer [place], M.D.  Anesthesia: General  Complications: None  Intraoperative findings:  Cystoscopy-urethra normal in caliber without stricture.  Prostate nonocclusive with mild bladder neck elevation.  Trabeculated bladder.  No solid or papillary lesions identified.  Inflammatory changes right hemitrigone secondary to indwelling stent with mild oozing Right ureteroscopy-calculi x2 noted mid ureter Right pyeloscopy-no renal calculi or abnormalities noted  Right retrograde pyelography post procedure showed no filling defects, stone fragments or contrast extravasation  EBL: Minimal  Specimens: Calculus for analysis   Indication: [name] [name] is a 76 y.o. male with a history of renal colic secondary to obstructing 3 mm mid ureteral calculi x2.  Initially underwent attempt at ureteroscopic removal by Dr. Erlene Quan late May 2023 however narrowing of the right distal ureter was encountered most likely secondary to prior radiation.  A ureteral stent was placed and he presents today for definitive stone treatment. After reviewing the management options for treatment, the patient elected to proceed with the above surgical procedure(s). We have discussed the potential benefits and risks of the procedure, side effects of the proposed treatment, the likelihood of the patient achieving the goals of the procedure, and any potential problems that might occur during the procedure or recuperation. Informed consent has been obtained.  Description of procedure:  The patient was taken to the operating room and general anesthesia was induced.  The patient was placed in the dorsal lithotomy position, prepped and draped in the usual sterile fashion, and preoperative  antibiotics were administered. A preoperative time-out was performed.   A 21 French cystoscope was lubricated, and passed per urethra and advanced proximally under direct vision with findings as described above.   The stent was grasped with endoscopic forceps and brought out to the urethral meatus.  A 0.038 Sensor wire was then placed through the stent followed by stent removal.  A 4.5 Fr semirigid ureteroscope was then advanced into the ureter next to the guidewire the ureteroscope was slowly advanced.  There was upward angulation of the mid ureter.  The leading calculus was identified however secondary to angulation it was not felt the stone could be safely treated.  The semirigid ureteroscope was removed and an 8 [place] single channel digital flexible ureteroscope was passed per urethra.  The scope was advanced into the distal ureter alongside the guidewire however approximately 1 cm and the scope was unable to be advanced secondary to slight narrowing.  A Super Stiff wire was placed through the ureteroscope and the scope was able to be advanced through this narrowing.  The leading calculi was then fragmented with a 242 m laser fiber at a setting of 0.3J/40 Hz.  The second calculus was initially not identified.  The ureteroscope was advanced up the ureter and into the renal pelvis.  Retrograde pyelograms performed and all calyces were examined and no stone was identified.  The ureteroscope was slowly withdrawn and the calculus was then identified in the mid ureter.  It was placed in a 1.9 [place] nitinol basket and removed without difficulty.  The ureteroscope was repassed and a small fragment from the initial calculus was swept out the ureter with an open basket.  1 final pass of the ureteroscope up to the proximal ureter showed no additional calculi or fragments.  A 92F/26 cm Contour ureteral stent with tether  was placed under fluoroscopic guidance.  The wire was then removed with an adequate stent  curl noted in the renal pelvis as well as in the bladder.  The bladder was then emptied and the procedure ended.  The stent tether was secured to the dorsal penile shaft using Mastisol and Tegaderm.  The patient appeared to tolerate the procedure well and without complications.  After anesthetic reversal the patient was transported to the PACU in stable condition.   Plan: He was instructed to remove the stent on Thursday, 04/23/2022 Postop follow-up will be scheduled in ~ 1 month   John Giovanni, MD

## 2022-04-21 NOTE — Anesthesia Preprocedure Evaluation (Signed)
Anesthesia Evaluation  Patient identified by MRN, date of birth, ID band Patient awake    Reviewed: Allergy & Precautions, NPO status , Patient's Chart, lab work & pertinent test results  History of Anesthesia Complications (+) PONV and history of anesthetic complications  Airway Mallampati: III  TM Distance: <3 FB Neck ROM: full    Dental  (+) Chipped   Pulmonary neg shortness of breath, former smoker,    Pulmonary exam normal        Cardiovascular Exercise Tolerance: Good hypertension, (-) angina(-) Past MI Normal cardiovascular exam     Neuro/Psych PSYCHIATRIC DISORDERS negative neurological ROS     GI/Hepatic Neg liver ROS, GERD  Controlled,  Endo/Other  negative endocrine ROS  Renal/GU Renal disease     Musculoskeletal   Abdominal   Peds  Hematology negative hematology ROS (+)   Anesthesia Other Findings Past Medical History: No date: Anemia No date: Arthritis 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 left leg No date: GERD (gastroesophageal reflux disease) No date: History of kidney stones No date: Hyperlipemia No date: Hypertension No date: PONV (postoperative nausea and vomiting) 09/23/2020: Presence of IVC filter 2018: Prostate cancer ([place])     Comment:  Treated with radiation No date: Rectal bleeding     Comment:  2013, 2020  Past Surgical History: 07/02/2019: COLONOSCOPY; N/A     Comment:  Procedure: COLONOSCOPY;  Surgeon: Lin Landsman,               MD;  Location: [facility] ENDOSCOPY;  Service:               Gastroenterology;  Laterality: N/A; No date: COLONOSCOPY     Comment:  2001, 2011 04/06/2022: CYSTOSCOPY W/ RETROGRADES; Right     Comment:  Procedure: CYSTOSCOPY WITH RETROGRADE PYELOGRAM;                Surgeon: Hollice Espy, MD;   Location: [facility] ORS;                Service: Urology;  Laterality: Right; 04/06/2022: CYSTOSCOPY/URETEROSCOPY/HOLMIUM LASER/STENT PLACEMENT;  Right     Comment:  Procedure: CYSTOSCOPY/URETEROSCOPY/HOLMIUM LASER/STENT               PLACEMENT;  Surgeon: Hollice Espy, MD;  Location: [facility]              ORS;  Service: Urology;  Laterality: Right; 07/02/2019: ESOPHAGOGASTRODUODENOSCOPY; N/A     Comment:  Procedure: ESOPHAGOGASTRODUODENOSCOPY (EGD);  Surgeon:               Lin Landsman, MD;  Location: [facility] ENDOSCOPY;                Service: Gastroenterology;  Laterality: N/A; 2013: ESOPHAGOGASTRODUODENOSCOPY 2012: FOOT FRACTURE SURGERY; Right     Comment:  Heel fracture repair/ has metal, fell off ladder 2005: HERNIA REPAIR; Right     Comment:  inguinal 09/23/2020: IVC FILTER INSERTION; N/A     Comment:  Procedure: IVC FILTER INSERTION;  Surgeon: Algernon Huxley,              MD;  Location: [place] CV LAB;  Service:               Cardiovascular;  Laterality: N/A; 11/13/2020: IVC FILTER REMOVAL; N/A  Comment:  Procedure: IVC FILTER REMOVAL;  Surgeon: Algernon Huxley,               MD;  Location: [place] [facility] LAB;  Service:               Cardiovascular;  Laterality: N/A; No date: TONSILLECTOMY     Comment:  as a child 10/21/2020: TOTAL HIP ARTHROPLASTY; Right     Comment:  Procedure: TOTAL HIP ARTHROPLASTY;  Surgeon: Dereck Leep, MD;  Location: [facility] ORS;  Service: Orthopedics;               Laterality: Right; 07/18/2021: XI ROBOTIC ASSISTED INGUINAL HERNIA REPAIR WITH MESH; Right     Comment:  Procedure: XI ROBOTIC ASSISTED INGUINAL HERNIA REPAIR               WITH MESH;  Surgeon: Benjamine Sprague, DO;  Location: [facility]               ORS;  Service: General;  Laterality: Right;  BMI    Body Mass Index: 30.27 kg/m      Reproductive/Obstetrics negative OB ROS                             Anesthesia Physical Anesthesia Plan  ASA:  3  Anesthesia Plan: General LMA   Post-op Pain Management:    Induction: Intravenous  PONV Risk Score and Plan: Dexamethasone, Ondansetron, Midazolam and Treatment may vary due to age or medical condition  Airway Management Planned: LMA  Additional Equipment:   Intra-op Plan:   Post-operative Plan: Extubation in OR  Informed Consent: I have reviewed the patients History and Physical, chart, labs and discussed the procedure including the risks, benefits and alternatives for the proposed anesthesia with the patient or authorized representative who has indicated his/her understanding and acceptance.     Dental Advisory Given  Plan Discussed with: Anesthesiologist, CRNA and Surgeon  Anesthesia Plan Comments: (Patient consented for risks of anesthesia including but not limited to:  - adverse reactions to medications - damage to eyes, teeth, lips or other oral mucosa - nerve damage due to positioning  - sore throat or hoarseness - Damage to heart, brain, nerves, lungs, other parts of body or loss of life  Patient voiced understanding.)        Anesthesia Quick Evaluation

## 2022-04-21 NOTE — Transfer of Care (Signed)
Immediate Anesthesia Transfer of Care Note  Patient: [name] [name]  Procedure(s) Performed: CYSTOSCOPY/URETEROSCOPY/HOLMIUM LASER/STENT EXCHANGE (Right: Ureter)  Patient Location: PACU  Anesthesia Type:General  Level of Consciousness: drowsy and patient cooperative  Airway & Oxygen Therapy: Patient Spontanous Breathing and Patient connected to face mask oxygen  Post-op Assessment: Report given to RN and Post -op Vital signs reviewed and stable  Post vital signs: Reviewed and stable  Last Vitals:  Vitals Value Taken Time  BP 110/61 04/21/22 1350  Temp 36.7 C 04/21/22 1345  Pulse 59 04/21/22 1350  Resp 17 04/21/22 1350  SpO2 98 % 04/21/22 1350  Vitals shown include unvalidated device data.  Last Pain:  Vitals:   04/21/22 1007  TempSrc: Temporal  PainSc: 2          Complications: No notable events documented.

## 2022-04-21 NOTE — Interval H&P Note (Signed)
History and Physical Interval Note:  76 y.o. male with right lower proximal ureteral calculi x2.  He had renal colic and underwent an attempt at ureteroscopic removal by Dr. Erlene Quan on 5/22.  Due to ureteral narrowing this was not successful and a stent was placed.  We discussed follow-up ureteroscopy.  Coumadin has been held for 2 days however INR today was 3.  He has been having significant stent symptoms since placement.  Since the calculi are small will proceed with ureteroscopy.  The likely need of stent replacement was discussed.  04/21/2022 12:17 PM  [name] [name]  has presented today for surgery, with the diagnosis of Right Ureteral Stone.  The various methods of treatment have been discussed with the patient and family. After consideration of risks, benefits and other options for treatment, the patient has consented to  Procedure(s): CYSTOSCOPY/URETEROSCOPY/HOLMIUM LASER/STENT EXCHANGE (Right) as a surgical intervention.  The patient's history has been reviewed, patient examined, no change in status, stable for surgery.  I have reviewed the patient's chart and labs.  Questions were answered to the patient's satisfaction.     [place]

## 2022-04-21 NOTE — Discharge Instructions (Addendum)
DISCHARGE INSTRUCTIONS FOR KIDNEY STONE/URETERAL STENT   MEDICATIONS:  1. Resume all your other meds from home except for the Coumadin.  Your INR today was 3.4.  Please contact your prescribing provider regarding when they recommend your Coumadin be restarted and at what dose.  From your surgery standpoint the Coumadin could be restarted 04/22/2022 2.  AZO (over-the-counter) can help with the burning/stinging when you urinate.   ACTIVITY:  1. May resume regular activities in 24 hours. 2. No driving while on narcotic pain medications  3. Drink plenty of water  4. Continue to walk at home - you can still get blood clots when you are at home, so keep active, but don't over do it.  5. May return to work/school tomorrow or when you feel ready   BATHING:  1. You can shower. 2. You have a string coming from your urethra: The stent string is attached to your ureteral stent. Do not pull on this.   SIGNS/SYMPTOMS TO CALL:  Common postoperative symptoms include urinary frequency, urgency, bladder spasm and blood in the urine  Please call us if you have a fever greater than 101.5, uncontrolled nausea/vomiting, uncontrolled pain, dizziness, unable to urinate, excessively bloody urine, chest pain, shortness of breath, leg swelling, leg pain, or any other concerns or questions.   You can reach [place] at [phone].   FOLLOW-UP:  1. You we will be contacted for a follow-up appointment by our office 2. You have a string attached to your stent, you may remove it on Thursday, 04/24/2022. To do this, pull the string until the stent is completely removed. You may feel an odd sensation in your back.      AMBULATORY SURGERY  DISCHARGE INSTRUCTIONS   The drugs that you were given will stay in your system until tomorrow so for the next 24 hours you should not:  Drive an automobile Make any legal decisions Drink any alcoholic beverage   You may resume regular meals tomorrow.  Today it is better to start  with liquids and gradually work up to solid foods.  You may eat anything you prefer, but it is better to start with liquids, then soup and crackers, and gradually work up to solid foods.   Please notify your doctor immediately if you have any unusual bleeding, trouble breathing, redness and pain at the surgery site, drainage, fever, or pain not relieved by medication.    Additional Instructions:        Please contact your physician with any problems or Same Day Surgery at [phone], Monday through Friday 6 am to 4 pm, or [place] at [facility] number at [phone].

## 2022-04-21 NOTE — Anesthesia Procedure Notes (Signed)
Procedure Name: LMA Insertion Date/Time: 04/21/2022 12:29 PM Performed by: Loletha Grayer, CRNA Pre-anesthesia Checklist: Patient identified, Patient being monitored, Timeout performed, Emergency Drugs available and Suction available Patient Re-evaluated:Patient Re-evaluated prior to induction Oxygen Delivery Method: Circle system utilized Preoxygenation: Pre-oxygenation with 100% oxygen Induction Type: IV induction Ventilation: Mask ventilation without difficulty LMA: LMA inserted LMA Size: 5.0 Number of attempts: 1 Placement Confirmation: positive ETCO2 Tube secured with: Tape Dental Injury: Teeth and Oropharynx as per pre-operative assessment

## 2022-04-22 ENCOUNTER — Encounter: Payer: Self-pay | Admitting: Urology

## 2022-04-22 NOTE — Anesthesia Postprocedure Evaluation (Signed)
Anesthesia Post Note  Patient: Lancer Thurner Quist  Procedure(s) Performed: CYSTOSCOPY/URETEROSCOPY/HOLMIUM LASER/STENT EXCHANGE (Right: Ureter)  Patient location during evaluation: PACU Anesthesia Type: General Level of consciousness: awake and alert Pain management: pain level controlled Vital Signs Assessment: post-procedure vital signs reviewed and stable Respiratory status: spontaneous breathing, nonlabored ventilation, respiratory function stable and patient connected to nasal cannula oxygen Cardiovascular status: blood pressure returned to baseline and stable Postop Assessment: no apparent nausea or vomiting Anesthetic complications: no   No notable events documented.   Last Vitals:  Vitals:   04/21/22 1415 04/21/22 1443  BP: (!) 143/63 (!) 147/67  Pulse: 68 73  Resp: 11 14  Temp: 36.4 C 36.4 C  SpO2: 97% 100%    Last Pain:  Vitals:   04/22/22 0857  TempSrc:   PainSc: 4                  Precious Haws Nathin Saran

## 2022-04-24 ENCOUNTER — Encounter: Payer: Self-pay | Admitting: Urology

## 2022-04-24 NOTE — Telephone Encounter (Signed)
Spoke to patient and he seems to be doing better after have a bowel movement. He expressed he may need to have another one. I informed him that it may be helpful if he took a stool softener to help move things along. Patient agreed and will give it a try.

## 2022-04-25 ENCOUNTER — Emergency Department: Payer: Medicare HMO

## 2022-04-25 ENCOUNTER — Inpatient Hospital Stay
Admission: EM | Admit: 2022-04-25 | Discharge: 2022-04-27 | DRG: 862 | Disposition: A | Discharge status: Home Health | Payer: Medicare HMO | Attending: Family Medicine | Admitting: Family Medicine

## 2022-04-25 ENCOUNTER — Other Ambulatory Visit: Payer: Self-pay

## 2022-04-25 DIAGNOSIS — Z87442 Personal history of urinary calculi: Secondary | ICD-10-CM

## 2022-04-25 DIAGNOSIS — Z96641 Presence of right artificial hip joint: Secondary | ICD-10-CM | POA: Diagnosis present

## 2022-04-25 DIAGNOSIS — Z85828 Personal history of other malignant neoplasm of skin: Secondary | ICD-10-CM | POA: Diagnosis not present

## 2022-04-25 DIAGNOSIS — Z79899 Other long term (current) drug therapy: Secondary | ICD-10-CM

## 2022-04-25 DIAGNOSIS — A419 Sepsis, unspecified organism: Secondary | ICD-10-CM

## 2022-04-25 DIAGNOSIS — Z8249 Family history of ischemic heart disease and other diseases of the circulatory system: Secondary | ICD-10-CM

## 2022-04-25 DIAGNOSIS — Z923 Personal history of irradiation: Secondary | ICD-10-CM

## 2022-04-25 DIAGNOSIS — B965 Pseudomonas (aeruginosa) (mallei) (pseudomallei) as the cause of diseases classified elsewhere: Secondary | ICD-10-CM | POA: Diagnosis present

## 2022-04-25 DIAGNOSIS — Z87891 Personal history of nicotine dependence: Secondary | ICD-10-CM | POA: Diagnosis not present

## 2022-04-25 DIAGNOSIS — Z882 Allergy status to sulfonamides status: Secondary | ICD-10-CM

## 2022-04-25 DIAGNOSIS — T8144XA Sepsis following a procedure, initial encounter: Principal | ICD-10-CM | POA: Diagnosis present

## 2022-04-25 DIAGNOSIS — Z7901 Long term (current) use of anticoagulants: Secondary | ICD-10-CM

## 2022-04-25 DIAGNOSIS — Z86718 Personal history of other venous thrombosis and embolism: Secondary | ICD-10-CM | POA: Diagnosis not present

## 2022-04-25 DIAGNOSIS — Z20822 Contact with and (suspected) exposure to covid-19: Secondary | ICD-10-CM | POA: Diagnosis present

## 2022-04-25 DIAGNOSIS — Y838 Other surgical procedures as the cause of abnormal reaction of the patient, or of later complication, without mention of misadventure at the time of the procedure: Secondary | ICD-10-CM | POA: Diagnosis present

## 2022-04-25 DIAGNOSIS — N1339 Other hydronephrosis: Secondary | ICD-10-CM | POA: Diagnosis present

## 2022-04-25 DIAGNOSIS — I10 Essential (primary) hypertension: Secondary | ICD-10-CM | POA: Diagnosis present

## 2022-04-25 DIAGNOSIS — K219 Gastro-esophageal reflux disease without esophagitis: Secondary | ICD-10-CM | POA: Diagnosis present

## 2022-04-25 DIAGNOSIS — R6520 Severe sepsis without septic shock: Secondary | ICD-10-CM | POA: Diagnosis present

## 2022-04-25 DIAGNOSIS — N400 Enlarged prostate without lower urinary tract symptoms: Secondary | ICD-10-CM | POA: Diagnosis present

## 2022-04-25 DIAGNOSIS — Z8546 Personal history of malignant neoplasm of prostate: Secondary | ICD-10-CM | POA: Diagnosis not present

## 2022-04-25 DIAGNOSIS — N202 Calculus of kidney with calculus of ureter: Secondary | ICD-10-CM | POA: Diagnosis present

## 2022-04-25 DIAGNOSIS — Z885 Allergy status to narcotic agent status: Secondary | ICD-10-CM

## 2022-04-25 DIAGNOSIS — N390 Urinary tract infection, site not specified: Secondary | ICD-10-CM | POA: Diagnosis present

## 2022-04-25 DIAGNOSIS — M1990 Unspecified osteoarthritis, unspecified site: Secondary | ICD-10-CM | POA: Diagnosis present

## 2022-04-25 DIAGNOSIS — E785 Hyperlipidemia, unspecified: Secondary | ICD-10-CM | POA: Diagnosis present

## 2022-04-25 LAB — TROPONIN I (HIGH SENSITIVITY)
Troponin I (High Sensitivity): 11 ng/L (ref <18)
Troponin I (High Sensitivity): 17 ng/L (ref <18)

## 2022-04-25 LAB — CALCULI, WITH PHOTOGRAPH (CLINICAL LAB)
Calcium Oxalate Dihydrate: 20 %
Calcium Oxalate Monohydrate: 80 %
Weight Calculi: 19 mg

## 2022-04-25 LAB — URINALYSIS, ROUTINE W REFLEX MICROSCOPIC
Bilirubin Urine: NEGATIVE
Glucose, UA: NEGATIVE mg/dL
Ketones, ur: 80 mg/dL — AB
Nitrite: POSITIVE — AB
Protein, ur: 100 mg/dL — AB
RBC / HPF: >50 RBC/hpf — ABNORMAL HIGH (ref 0–5)
Specific Gravity, Urine: 1.015 (ref 1.005–1.030)
Squamous Epithelial / HPF: NONE SEEN (ref 0–5)
WBC, UA: >50 WBC/hpf — ABNORMAL HIGH (ref 0–5)
pH: 7 (ref 5.0–8.0)

## 2022-04-25 LAB — CBC WITH DIFFERENTIAL/PLATELET
Abs Immature Granulocytes: 0.04 10*3/uL (ref 0.00–0.07)
Basophils Absolute: 0 10*3/uL (ref 0.0–0.1)
Basophils Relative: 0 %
Eosinophils Absolute: 0 10*3/uL (ref 0.0–0.5)
Eosinophils Relative: 0 %
HCT: 40.9 % (ref 39.0–52.0)
Hemoglobin: 13.2 g/dL (ref 13.0–17.0)
Immature Granulocytes: 0 %
Lymphocytes Relative: 6 %
Lymphs Abs: 0.7 10*3/uL (ref 0.7–4.0)
MCH: 29.6 pg (ref 26.0–34.0)
MCHC: 32.3 g/dL (ref 30.0–36.0)
MCV: 91.7 fL (ref 80.0–100.0)
Monocytes Absolute: 0.6 10*3/uL (ref 0.1–1.0)
Monocytes Relative: 5 %
Neutro Abs: 10.2 10*3/uL — ABNORMAL HIGH (ref 1.7–7.7)
Neutrophils Relative %: 89 %
Platelets: 243 10*3/uL (ref 150–400)
RBC: 4.46 MIL/uL (ref 4.22–5.81)
RDW: 12.9 % (ref 11.5–15.5)
WBC: 11.6 10*3/uL — ABNORMAL HIGH (ref 4.0–10.5)
nRBC: 0 % (ref 0.0–0.2)

## 2022-04-25 LAB — LIPASE, BLOOD: Lipase: 29 U/L (ref 11–51)

## 2022-04-25 LAB — COMPREHENSIVE METABOLIC PANEL
ALT: 11 U/L (ref 0–44)
AST: 16 U/L (ref 15–41)
Albumin: 3.6 g/dL (ref 3.5–5.0)
Alkaline Phosphatase: 56 U/L (ref 38–126)
Anion gap: 7 (ref 5–15)
BUN: 14 mg/dL (ref 8–23)
CO2: 23 mmol/L (ref 22–32)
Calcium: 9.2 mg/dL (ref 8.9–10.3)
Chloride: 109 mmol/L (ref 98–111)
Creatinine, Ser: 0.82 mg/dL (ref 0.61–1.24)
GFR, Estimated: >60 mL/min (ref >60)
Glucose, Bld: 115 mg/dL — ABNORMAL HIGH (ref 70–99)
Potassium: 3.6 mmol/L (ref 3.5–5.1)
Sodium: 139 mmol/L (ref 135–145)
Total Bilirubin: 1.2 mg/dL (ref 0.3–1.2)
Total Protein: 6.8 g/dL (ref 6.5–8.1)

## 2022-04-25 LAB — PROCALCITONIN: Procalcitonin: <0.1 ng/mL

## 2022-04-25 LAB — APTT: aPTT: 46 seconds — ABNORMAL HIGH (ref 24–36)

## 2022-04-25 LAB — PROTIME-INR
INR: 2.1 — ABNORMAL HIGH (ref 0.8–1.2)
Prothrombin Time: 23.2 seconds — ABNORMAL HIGH (ref 11.4–15.2)

## 2022-04-25 LAB — BRAIN NATRIURETIC PEPTIDE: B Natriuretic Peptide: 45.8 pg/mL (ref 0.0–100.0)

## 2022-04-25 LAB — RESP PANEL BY RT-PCR (FLU A&B, COVID) ARPGX2
Influenza A by PCR: NEGATIVE
Influenza B by PCR: NEGATIVE
SARS Coronavirus 2 by RT PCR: NEGATIVE

## 2022-04-25 LAB — LACTIC ACID, PLASMA: Lactic Acid, Venous: 1.2 mmol/L (ref 0.5–1.9)

## 2022-04-25 MED ORDER — ACETAMINOPHEN 500 MG PO TABS
1000.0000 mg | ORAL_TABLET | Freq: Once | ORAL | Status: AC
  Administered 2022-04-25: 1000 mg via ORAL
  Filled 2022-04-25: qty 2

## 2022-04-25 MED ORDER — ACETAMINOPHEN 650 MG RE SUPP: 650.0000 mg | Freq: Four times a day (QID) | RECTAL | Status: DC | PRN

## 2022-04-25 MED ORDER — METRONIDAZOLE 500 MG/100ML IV SOLN
500.0000 mg | Freq: Once | INTRAVENOUS | Status: AC
  Administered 2022-04-25: 500 mg via INTRAVENOUS
  Filled 2022-04-25: qty 100

## 2022-04-25 MED ORDER — LACTATED RINGERS IV BOLUS (SEPSIS)
1000.0000 mL | Freq: Once | INTRAVENOUS | Status: AC
  Administered 2022-04-25: 1000 mL via INTRAVENOUS

## 2022-04-25 MED ORDER — ONDANSETRON HCL 4 MG PO TABS: 4.0000 mg | ORAL_TABLET | Freq: Four times a day (QID) | ORAL | Status: DC | PRN

## 2022-04-25 MED ORDER — CEFEPIME HCL 2 G IV SOLR
2.0000 g | Freq: Once | INTRAVENOUS | Status: AC
  Administered 2022-04-25: 2 g via INTRAVENOUS
  Filled 2022-04-25: qty 12.5

## 2022-04-25 MED ORDER — LACTATED RINGERS IV SOLN: INTRAVENOUS | Status: AC

## 2022-04-25 MED ORDER — FENTANYL CITRATE PF 50 MCG/ML IJ SOSY
50.0000 ug | PREFILLED_SYRINGE | Freq: Once | INTRAMUSCULAR | Status: AC
  Administered 2022-04-25: 50 ug via INTRAVENOUS
  Filled 2022-04-25: qty 1

## 2022-04-25 MED ORDER — SODIUM CHLORIDE 0.9 % IV SOLN: 2.0000 g | Freq: Once | INTRAVENOUS | Status: DC

## 2022-04-25 MED ORDER — TAMSULOSIN HCL 0.4 MG PO CAPS
0.4000 mg | ORAL_CAPSULE | Freq: Every day | ORAL | Status: DC
  Administered 2022-04-25: 0.4 mg via ORAL
  Filled 2022-04-25 (×2): qty 1

## 2022-04-25 MED ORDER — VITAMIN B-12 1000 MCG PO TABS
2500.0000 ug | ORAL_TABLET | Freq: Every day | ORAL | Status: DC
  Administered 2022-04-25 – 2022-04-27 (×3): 2500 ug via ORAL
  Filled 2022-04-25: qty 3
  Filled 2022-04-25: qty 2.5
  Filled 2022-04-25 (×2): qty 3

## 2022-04-25 MED ORDER — WARFARIN SODIUM 4 MG PO TABS
4.0000 mg | ORAL_TABLET | ORAL | Status: DC
  Administered 2022-04-25: 4 mg via ORAL
  Filled 2022-04-25 (×3): qty 1

## 2022-04-25 MED ORDER — VANCOMYCIN HCL IN DEXTROSE 1-5 GM/200ML-% IV SOLN: 1000.0000 mg | Freq: Once | INTRAVENOUS | Status: DC

## 2022-04-25 MED ORDER — IOHEXOL 350 MG/ML SOLN
100.0000 mL | Freq: Once | INTRAVENOUS | Status: AC | PRN
  Administered 2022-04-25: 100 mL via INTRAVENOUS

## 2022-04-25 MED ORDER — ONDANSETRON HCL 4 MG/2ML IJ SOLN
4.0000 mg | Freq: Four times a day (QID) | INTRAMUSCULAR | Status: DC | PRN
  Administered 2022-04-26: 4 mg via INTRAVENOUS
  Filled 2022-04-25: qty 2

## 2022-04-25 MED ORDER — WARFARIN SODIUM 3 MG PO TABS
3.0000 mg | ORAL_TABLET | ORAL | Status: DC
Start: 2022-04-27 — End: 2022-04-26

## 2022-04-25 MED ORDER — ONDANSETRON HCL 4 MG/2ML IJ SOLN
4.0000 mg | Freq: Once | INTRAMUSCULAR | Status: AC
  Administered 2022-04-25: 4 mg via INTRAVENOUS
  Filled 2022-04-25: qty 2

## 2022-04-25 MED ORDER — VANCOMYCIN HCL 2000 MG/400ML IV SOLN
2000.0000 mg | Freq: Once | INTRAVENOUS | Status: AC
  Administered 2022-04-25: 2000 mg via INTRAVENOUS
  Filled 2022-04-25 (×2): qty 400

## 2022-04-25 MED ORDER — SODIUM CHLORIDE 0.9 % IV SOLN
2.0000 g | Freq: Three times a day (TID) | INTRAVENOUS | Status: DC
  Administered 2022-04-25 – 2022-04-27 (×6): 2 g via INTRAVENOUS
  Filled 2022-04-25 (×8): qty 12.5

## 2022-04-25 MED ORDER — ROSUVASTATIN CALCIUM 5 MG PO TABS
5.0000 mg | ORAL_TABLET | Freq: Every day | ORAL | Status: DC
  Administered 2022-04-25: 5 mg via ORAL
  Filled 2022-04-25 (×3): qty 1

## 2022-04-25 MED ORDER — WARFARIN SODIUM 3 MG PO TABS: 3.0000 mg | ORAL_TABLET | ORAL | Status: DC

## 2022-04-25 MED ORDER — OXYCODONE-ACETAMINOPHEN 5-325 MG PO TABS: 1.0000 | ORAL_TABLET | ORAL | Status: DC | PRN

## 2022-04-25 MED ORDER — WARFARIN - PHARMACIST DOSING INPATIENT
Freq: Every day | Status: DC
  Filled 2022-04-25: qty 1

## 2022-04-25 MED ORDER — ACETAMINOPHEN 325 MG PO TABS
650.0000 mg | ORAL_TABLET | Freq: Four times a day (QID) | ORAL | Status: DC | PRN
  Administered 2022-04-25 – 2022-04-26 (×4): 650 mg via ORAL
  Filled 2022-04-25 (×4): qty 2

## 2022-04-25 NOTE — Assessment & Plan Note (Signed)
From a urinary source As evidenced by fever, tachycardia, tachypnea, leukocytosis and pyuria Patient has a history of nephrolithiasis and is status post recent instrumentation (cystoscopy, right ureteroscopy and stone removal and right ureteral stent exchange) Start patient empirically on cefepime adjusted to renal function Follow-up results of blood and urine culture

## 2022-04-25 NOTE — ED Notes (Signed)
Rainbow, grey top,1st set of cultures and urine sent to lab

## 2022-04-25 NOTE — Progress Notes (Signed)
CODE SEPSIS - PHARMACY COMMUNICATION  **Broad Spectrum Antibiotics should be administered within 1 hour of Sepsis diagnosis**  Time Code Sepsis Called/Page Received:  6/10 @ 0520   Antibiotics Ordered: Cefepime, Vancomycin   Time of 1st antibiotic administration: Cefepime 2 gm IV X 1 on 6/10 @ 0556.   Additional action taken by pharmacy:   If necessary, Name of Provider/Nurse Contacted:     Maela Takeda D ,PharmD Clinical Pharmacist  04/25/2022  6:24 AM

## 2022-04-25 NOTE — ED Provider Notes (Signed)
[facility] Provider Note    Event Date/Time   First MD Initiated Contact with Patient 04/25/22 0505     (approximate)   History   Fever   HPI  TIRAS BIANCHINI is a 76 y.o. male with history of hypertension, hyperlipidemia, kidney stones who presents to the emergency department with complaints of fever of 101.1 at home today, cough, congestion, left-sided chest pressure, intermittent abdominal pain and difficulty urinating.  Patient just underwent cystoscopy, lithotripsy and right ureteral stent exchange by Dr. Bernardo Heater on 04/21/2022 for a kidney stone.  He states he remove the stent yesterday at home.  He has not had any hematuria.  No dysuria.  No nausea, vomiting or diarrhea.  Last bowel movement was yesterday.  States he just feels like "crap".   History provided by patient and wife.    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   Prostate cancer ([place]) 2018   Treated with radiation   Rectal bleeding    2013, 2020    Past Surgical History:  Procedure Laterality Date   COLONOSCOPY N/A 07/02/2019   Procedure: COLONOSCOPY;  Surgeon: Lin Landsman, MD;  Location: [facility] ENDOSCOPY;  Service: Gastroenterology;  Laterality: N/A;   COLONOSCOPY     2001, 2011   CYSTOSCOPY W/ RETROGRADES Right 04/06/2022   Procedure: CYSTOSCOPY WITH RETROGRADE PYELOGRAM;  Surgeon: Hollice Espy, MD;  Location: [facility] ORS;  Service: Urology;  Laterality: Right;   CYSTOSCOPY/URETEROSCOPY/HOLMIUM LASER/STENT PLACEMENT Right 04/06/2022   Procedure: CYSTOSCOPY/URETEROSCOPY/HOLMIUM LASER/STENT PLACEMENT;  Surgeon: Hollice Espy, MD;   Location: [facility] ORS;  Service: Urology;  Laterality: Right;   CYSTOSCOPY/URETEROSCOPY/HOLMIUM LASER/STENT PLACEMENT Right 04/21/2022   Procedure: CYSTOSCOPY/URETEROSCOPY/HOLMIUM LASER/STENT EXCHANGE;  Surgeon: Abbie Sons, MD;  Location: [facility] ORS;  Service: Urology;  Laterality: Right;   ESOPHAGOGASTRODUODENOSCOPY N/A 07/02/2019   Procedure: ESOPHAGOGASTRODUODENOSCOPY (EGD);  Surgeon: Lin Landsman, MD;  Location: [facility] ENDOSCOPY;  Service: Gastroenterology;  Laterality: N/A;   ESOPHAGOGASTRODUODENOSCOPY  2013   FOOT FRACTURE SURGERY Right 2012   Heel fracture repair/ has metal, fell off ladder   HERNIA REPAIR Right 2005   inguinal   IVC FILTER INSERTION N/A 09/23/2020   Procedure: IVC FILTER INSERTION;  Surgeon: Algernon Huxley, MD;  Location: [place] [facility];  Service: Cardiovascular;  Laterality: N/A;   IVC FILTER REMOVAL N/A 11/13/2020   Procedure: IVC FILTER REMOVAL;  Surgeon: Algernon Huxley, MD;  Location: [place] [facility];  Service: Cardiovascular;  Laterality: N/A;   TONSILLECTOMY     as a child   TOTAL HIP ARTHROPLASTY Right 10/21/2020   Procedure: TOTAL HIP ARTHROPLASTY;  Surgeon: Dereck Leep, MD;  Location: [facility] ORS;  Service: Orthopedics;  Laterality: Right;   XI ROBOTIC ASSISTED INGUINAL HERNIA REPAIR WITH MESH Right 07/18/2021   Procedure: XI ROBOTIC ASSISTED INGUINAL HERNIA REPAIR WITH MESH;  Surgeon: Benjamine Sprague, DO;  Location: [facility] ORS;  Service: General;  Laterality: Right;    MEDICATIONS:  Prior to Admission medications   Medication Sig Start Date End Date Taking? Authorizing  Provider  acetaminophen (TYLENOL) 500 MG tablet Take 1,000 mg by mouth at bedtime.    [provider]  Cyanocobalamin 2500 MCG SUBL Take 2,500 mcg by mouth daily. Takes at 11 am    [provider]  Glucosamine-Chondroitin (OSTEO BI-FLEX REGULAR STRENGTH PO) Take 2 tablets by mouth daily. Takes at 11 am    [provider]  ondansetron (ZOFRAN-ODT) 4 MG  disintegrating tablet Take 1 tablet (4 mg total) by mouth every 8 (eight) hours as needed for nausea or vomiting. 04/05/22   Alfred Levins, [place], MD  oxyCODONE-acetaminophen (PERCOCET) 5-325 MG tablet Take 1 tablet by mouth every 4 (four) hours as needed. 04/05/22   Rudene Re, MD  rosuvastatin (CRESTOR) 5 MG tablet Take 5 mg by mouth daily with lunch. 03/24/22   [provider]  tamsulosin (FLOMAX) 0.4 MG CAPS capsule Take 0.4 mg by mouth daily with lunch. 04/14/22   [provider]  triamterene-hydrochlorothiazide (MAXZIDE-25) 37.5-25 MG tablet Take 0.5 tablets by mouth daily with lunch. Takes at 11 am 06/02/17   [provider]  warfarin (COUMADIN) 3 MG tablet Take 3 mg by mouth as directed. Takes on Mondays, Weds., and Fridays 01/13/22 01/13/23  [provider]  warfarin (COUMADIN) 4 MG tablet Take 4 mg by mouth as directed. Takes on Tues, Thurs, Sat and Sun. 06/19/19 04/16/22  [provider]    Physical Exam   Triage Vital Signs: ED Triage Vitals  Enc Vitals Group     BP 04/25/22 0454 (!) 160/82     Pulse Rate 04/25/22 0454 (!) 124     Resp 04/25/22 0454 20     Temp 04/25/22 0454 99.9 F (37.7 C)     Temp Source 04/25/22 0454 Oral     SpO2 04/25/22 0454 93 %     Weight 04/25/22 0455 202 lb 13.2 oz (92 kg)     Height 04/25/22 0455 '5\' 9"'$  (1.753 m)     Head Circumference --      Peak Flow --      Pain Score 04/25/22 0455 4     Pain Loc --      Pain Edu? --      Excl. in [place]? --     Most recent vital signs: Vitals:   04/25/22 0640 04/25/22 0700  BP: (!) 155/69 (!) 144/54  Pulse: (!) 111 97  Resp: (!) 26 (!) 23  Temp:    SpO2: 93% 92%    CONSTITUTIONAL: Alert and oriented and responds appropriately to questions.  Elderly, no distress HEAD: Normocephalic, atraumatic EYES: Conjunctivae clear, pupils appear equal, sclera nonicteric ENT: normal nose; moist mucous membranes NECK: Supple, normal ROM CARD: Regular and tachycardic; S1 and  S2 appreciated; no murmurs, no clicks, no rubs, no gallops RESP: Normal chest excursion without splinting or tachypnea; breath sounds clear and equal bilaterally; no wheezes, no rhonchi, no rales, no hypoxia or respiratory distress, speaking full sentences ABD/GI: Normal bowel sounds; non-distended; soft, non-tender, no rebound, no guarding, no peritoneal signs BACK: The back appears normal, no CVA tenderness EXT: Normal ROM in all joints; no deformity noted, no edema; no cyanosis SKIN: Normal color for age and race; warm; no rash on exposed skin NEURO: Moves all extremities equally, normal speech; no facial asymmetry, normal gait PSYCH: The patient's mood and manner are appropriate.   ED Results / Procedures / Treatments   LABS: (all labs ordered are listed, but only abnormal results are displayed) Labs Reviewed  COMPREHENSIVE METABOLIC PANEL -  Abnormal; Notable for the following components:      Result Value   Glucose, Bld 115 (*)    All other components within normal limits  CBC WITH DIFFERENTIAL/PLATELET - Abnormal; Notable for the following components:   WBC 11.6 (*)    Neutro Abs 10.2 (*)    All other components within normal limits  PROTIME-INR - Abnormal; Notable for the following components:   Prothrombin Time 23.2 (*)    INR 2.1 (*)    All other components within normal limits  URINALYSIS, ROUTINE W REFLEX MICROSCOPIC - Abnormal; Notable for the following components:   Color, Urine YELLOW (*)    APPearance CLOUDY (*)    Hgb urine dipstick LARGE (*)    Ketones, ur 80 (*)    Protein, ur 100 (*)    Nitrite POSITIVE (*)    Leukocytes,Ua LARGE (*)    RBC / HPF >50 (*)    WBC, UA >50 (*)    Bacteria, UA RARE (*)    All other components within normal limits  APTT - Abnormal; Notable for the following components:   aPTT 46 (*)    All other components within normal limits  RESP PANEL BY RT-PCR (FLU A&B, COVID) ARPGX2  CULTURE, BLOOD (ROUTINE X 2)  CULTURE, BLOOD (ROUTINE  X 2)  URINE CULTURE  LACTIC ACID, PLASMA  LIPASE, BLOOD  BRAIN NATRIURETIC PEPTIDE  PROCALCITONIN  TROPONIN I (HIGH SENSITIVITY)  TROPONIN I (HIGH SENSITIVITY)     EKG:  EKG Interpretation  Date/Time:  Saturday April 25 2022 04:56:51 EDT Ventricular Rate:  114 PR Interval:  162 QRS Duration: 130 QT Interval:  346 QTC Calculation: 476 R Axis:   45 Text Interpretation: Sinus tachycardia Non-specific intra-ventricular conduction block Cannot rule out Septal infarct , age undetermined Abnormal ECG When compared with ECG of 05-Apr-2022 05:42, PREVIOUS ECG IS PRESENT Confirmed by Pryor Curia [phone]) on 04/25/2022 4:59:03 AM         RADIOLOGY: My personal review and interpretation of imaging: CTA chest shows no PE, infiltrate, edema.  CT of the abdomen pelvis shows mild right hydronephrosis without ureteral obstruction.  I have personally reviewed all radiology reports.   CT Angio Chest PE W and/or Wo Contrast  Result Date: 04/25/2022 CLINICAL DATA:  Acute nonlocalized abdominal pain. Chest pressure and fever. EXAM: CT ANGIOGRAPHY CHEST CT ABDOMEN AND PELVIS WITH CONTRAST TECHNIQUE: Multidetector CT imaging of the chest was performed using the standard protocol during bolus administration of intravenous contrast. Multiplanar CT image reconstructions and MIPs were obtained to evaluate the vascular anatomy. Multidetector CT imaging of the abdomen and pelvis was performed using the standard protocol during bolus administration of intravenous contrast. RADIATION DOSE REDUCTION: This exam was performed according to the departmental dose-optimization program which includes automated exposure control, adjustment of the mA and/or kV according to patient size and/or use of iterative reconstruction technique. CONTRAST:  122m OMNIPAQUE IOHEXOL 350 MG/ML SOLN COMPARISON:  04/05/2022 abdominal CT FINDINGS: CTA CHEST FINDINGS Cardiovascular: Satisfactory opacification of the pulmonary arteries to the  segmental level. No evidence of pulmonary embolism when accounting for occasional areas of motion/streak artifact. Normal heart size. No pericardial effusion. Atheromatous calcification of the aorta and coronaries. Mediastinum/Nodes: Negative for adenopathy or mass Lungs/Pleura: Mild dependent atelectasis. There is no edema, consolidation, effusion, or pneumothorax. Musculoskeletal: No acute or aggressive finding. Thoracic spondylosis. Review of the MIP images confirms the above findings. CT ABDOMEN and PELVIS FINDINGS Hepatobiliary: Small hepatic cystic densities.Full gallbladder without inflammation or calcified stone.  Pancreas: Unremarkable. Spleen: Unremarkable. Adrenals/Urinary Tract: Negative adrenals. Mild right hydroureteronephrosis with accentuated urothelial enhancement of the right renal pelvis and ureter. No renal hemorrhage or ureteral calculus. Borderline perivesicular fat stranding. Stomach/Bowel: No obstruction. Left colonic diverticulosis. No visible bowel inflammation Vascular/Lymphatic: Atheromatous calcification of the aorta and iliacs. Atheromatous narrowing at the celiac origin. No acute vascular finding no mass or adenopathy. Reproductive:History of prostate cancer treated with radiotherapy. The prostate is largely obscured by streak artifact from the right hip prosthesis. Fiducial markers are noted. Other: No ascites or pneumoperitoneum. Musculoskeletal: Right hip arthroplasty and ordinary lumbar spine degeneration. No acute or aggressive finding Review of the MIP images confirms the above findings. IMPRESSION: Chest CTA: 1. Low lung volumes with atelectasis. 2. Negative for pulmonary embolism. 3. Atherosclerosis including the coronary arteries. Abdominal CT: 1. Mild right hydroureteronephrosis with right urothelial inflammation/hyperenhancement. No visible obstruction or renal hemorrhage. 2. Atherosclerosis and colonic diverticulosis. Electronically Signed   By: Jorje Guild M.D.   On:  04/25/2022 07:25   CT ABDOMEN PELVIS W CONTRAST  Result Date: 04/25/2022 CLINICAL DATA:  Acute nonlocalized abdominal pain. Chest pressure and fever. EXAM: CT ANGIOGRAPHY CHEST CT ABDOMEN AND PELVIS WITH CONTRAST TECHNIQUE: Multidetector CT imaging of the chest was performed using the standard protocol during bolus administration of intravenous contrast. Multiplanar CT image reconstructions and MIPs were obtained to evaluate the vascular anatomy. Multidetector CT imaging of the abdomen and pelvis was performed using the standard protocol during bolus administration of intravenous contrast. RADIATION DOSE REDUCTION: This exam was performed according to the departmental dose-optimization program which includes automated exposure control, adjustment of the mA and/or kV according to patient size and/or use of iterative reconstruction technique. CONTRAST:  122m OMNIPAQUE IOHEXOL 350 MG/ML SOLN COMPARISON:  04/05/2022 abdominal CT FINDINGS: CTA CHEST FINDINGS Cardiovascular: Satisfactory opacification of the pulmonary arteries to the segmental level. No evidence of pulmonary embolism when accounting for occasional areas of motion/streak artifact. Normal heart size. No pericardial effusion. Atheromatous calcification of the aorta and coronaries. Mediastinum/Nodes: Negative for adenopathy or mass Lungs/Pleura: Mild dependent atelectasis. There is no edema, consolidation, effusion, or pneumothorax. Musculoskeletal: No acute or aggressive finding. Thoracic spondylosis. Review of the MIP images confirms the above findings. CT ABDOMEN and PELVIS FINDINGS Hepatobiliary: Small hepatic cystic densities.Full gallbladder without inflammation or calcified stone. Pancreas: Unremarkable. Spleen: Unremarkable. Adrenals/Urinary Tract: Negative adrenals. Mild right hydroureteronephrosis with accentuated urothelial enhancement of the right renal pelvis and ureter. No renal hemorrhage or ureteral calculus. Borderline perivesicular fat  stranding. Stomach/Bowel: No obstruction. Left colonic diverticulosis. No visible bowel inflammation Vascular/Lymphatic: Atheromatous calcification of the aorta and iliacs. Atheromatous narrowing at the celiac origin. No acute vascular finding no mass or adenopathy. Reproductive:History of prostate cancer treated with radiotherapy. The prostate is largely obscured by streak artifact from the right hip prosthesis. Fiducial markers are noted. Other: No ascites or pneumoperitoneum. Musculoskeletal: Right hip arthroplasty and ordinary lumbar spine degeneration. No acute or aggressive finding Review of the MIP images confirms the above findings. IMPRESSION: Chest CTA: 1. Low lung volumes with atelectasis. 2. Negative for pulmonary embolism. 3. Atherosclerosis including the coronary arteries. Abdominal CT: 1. Mild right hydroureteronephrosis with right urothelial inflammation/hyperenhancement. No visible obstruction or renal hemorrhage. 2. Atherosclerosis and colonic diverticulosis. Electronically Signed   By: JJorje GuildM.D.   On: 04/25/2022 07:25   DG Chest 1 View  Result Date: 04/25/2022 CLINICAL DATA:  Chest pressure and fever. History of lithotripsy this week EXAM: CHEST  1 VIEW COMPARISON:  11/14/2010 FINDINGS: Low volume  chest. There is no edema, consolidation, effusion, or pneumothorax. Normal heart size and mild aortic tortuosity. IMPRESSION: Low volume chest without acute finding. Electronically Signed   By: Jorje Guild M.D.   On: 04/25/2022 06:08     PROCEDURES:  Critical Care performed: Yes, see critical care procedure note(s)   CRITICAL CARE Performed by: Cyril Mourning Raynold Blankenbaker   Total critical care time: 55 minutes  Critical care time was exclusive of separately billable procedures and treating other patients.  Critical care was necessary to treat or prevent imminent or life-threatening deterioration.  Critical care was time spent personally by me on the following activities: development  of treatment plan with patient and/or surrogate as well as nursing, discussions with consultants, evaluation of patient's response to treatment, examination of patient, obtaining history from patient or surrogate, ordering and performing treatments and interventions, ordering and review of laboratory studies, ordering and review of radiographic studies, pulse oximetry and re-evaluation of patient's condition.   Marland Kitchen1-3 Lead EKG Interpretation  Performed by: Jaymeson Mengel, Delice Bison, DO Authorized by: Jahniya Duzan, Delice Bison, DO     Interpretation: abnormal     ECG rate:  110   ECG rate assessment: tachycardic     Rhythm: sinus tachycardia     Ectopy: none     Conduction: normal       IMPRESSION / MDM / ASSESSMENT AND PLAN / ED COURSE  I reviewed the triage vital signs and the nursing notes.    Patient here with complaints of cough, congestion, chest pain, intermittent abdominal discomfort after recent lithotripsy, and right ureteral stent exchange.  The patient is on the cardiac monitor to evaluate for evidence of arrhythmia and/or significant heart rate changes.   DIFFERENTIAL DIAGNOSIS (includes but not limited to):   Sepsis, bacteremia, UTI, pyelonephritis, colitis, diverticulitis, COVID-19, pneumonia, ACS, PE   Patient's presentation is most consistent with acute presentation with potential threat to life or bodily function.   PLAN: We will obtain CBC, CMP, lactic, blood cultures, procalcitonin, COVID swab, troponin, BNP, chest x-ray.  I feel patient will likely need a CTA of his chest as well as his abdomen pelvis given chest pain and abdominal pain today with recent admission and surgery.  He is high risk for PE.  Will give 30 mL/kg IV fluid bolus and broad-spectrum antibiotics.  Anticipate admission.   MEDICATIONS GIVEN IN ED: Medications  lactated ringers infusion ( Intravenous New Bag/Given 04/25/22 0639)  lactated ringers bolus 1,000 mL (0 mLs Intravenous Stopped 04/25/22 0737)    And   lactated ringers bolus 1,000 mL (0 mLs Intravenous Stopped 04/25/22 0737)    And  lactated ringers bolus 1,000 mL (1,000 mLs Intravenous New Bag/Given 04/25/22 0744)  vancomycin (VANCOREADY) IVPB 2000 mg/400 mL (2,000 mg Intravenous New Bag/Given 04/25/22 0746)  ceFEPIme (MAXIPIME) 2 g in sodium chloride 0.9 % 100 mL IVPB (0 g Intravenous Stopped 04/25/22 0636)  metroNIDAZOLE (FLAGYL) IVPB 500 mg (0 mg Intravenous Stopped 04/25/22 0738)  fentaNYL (SUBLIMAZE) injection 50 mcg (50 mcg Intravenous Given 04/25/22 0603)  ondansetron (ZOFRAN) injection 4 mg (4 mg Intravenous Given 04/25/22 0604)  iohexol (OMNIPAQUE) 350 MG/ML injection 100 mL (100 mLs Intravenous Contrast Given 04/25/22 0619)  acetaminophen (TYLENOL) tablet 1,000 mg (1,000 mg Oral Given 04/25/22 0640)     ED COURSE: Patient's labs show mild leukocytosis with left shift.  Normal lactic.  Normal electrolytes and renal function.  Urine appears grossly infected and is nitrite positive with large leukocyte esterase and greater than 50,000 red blood  cells and white blood cells.  Culture pending.  CTA of the chest as well as CT of the abdomen pelvis reviewed/interpreted by myself and radiologist.  No PE or pneumonia.  He does have mild right hydronephrosis without ureteral obstruction or obvious signs of pyelonephritis.  Will admit for urosepsis.  Continues to be hemodynamically stable.  Heart rate and respiratory rate improving after fever has defervesced.  Patient and wife comfortable with this plan.  Will discuss with hospitalist.   CONSULTS:  Consulted and discussed patient's case with hospitalist, Dr. Francine Graven.  I have recommended admission and consulting physician agrees and will place admission orders.  Patient (and family if present) agree with this plan.   I reviewed all nursing notes, vitals, pertinent previous records.  All labs, EKGs, imaging ordered have been independently reviewed and interpreted by myself.    OUTSIDE RECORDS REVIEWED:  Reviewed patient's recent urology operative note on 04/21/2022.       FINAL CLINICAL IMPRESSION(S) / ED DIAGNOSES   Final diagnoses:  Acute UTI     Rx / DC Orders   ED Discharge Orders     None        Note:  This document was prepared using Dragon voice recognition software and may include unintentional dictation errors.   Braedyn Kauk, Delice Bison, DO 04/25/22 [phone]

## 2022-04-25 NOTE — ED Triage Notes (Signed)
Pt had lithotripsy this week, is now having chest pressure and a fever. Pt is flushed. Pt states "I feel awful".

## 2022-04-25 NOTE — Progress Notes (Signed)
Pharmacy Antibiotic Note  [name] [name] is a 76 y.o. 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  Pharmacy has been consulted for cefepime dosing.  Plan: start cefepime 2 grams IV every 8 hours Follow renal function for needed dose adjustments  Height: '5\' 9"'$  (175.3 cm) Weight: 92 kg (202 lb 13.2 oz) IBW/kg (Calculated) : 70.7  Temp (24hrs), Avg:100.3 F (37.9 C), Min:99.1 F (37.3 C), Max:101.8 F (38.8 C)  Recent Labs  Lab 04/20/22 0622 04/25/22 0512  WBC 7.0 11.6*  CREATININE 0.95 0.82  LATICACIDVEN  --  1.2    Estimated Creatinine Clearance: 85.9 mL/min (by C-G formula based on SCr of 0.82 mg/dL).    Allergies  Allergen Reactions   Codeine Nausea Only   Sulfa Antibiotics Nausea And Vomiting    Antimicrobials this admission: 06/10 vancomycin x 1 06/10 cefepime >>   Microbiology results: 06/10 BCx: pending 06/10 UCx: pending   Thank you for allowing pharmacy to be a part of this patient's care.  Dallie Piles 04/25/2022 9:31 AM

## 2022-04-25 NOTE — Assessment & Plan Note (Addendum)
Hold Maxide for now since patient is normotensive

## 2022-04-25 NOTE — ED Notes (Signed)
Back from CT

## 2022-04-25 NOTE — ED Notes (Signed)
Informed RN bed assigned 

## 2022-04-25 NOTE — ED Notes (Signed)
With CT 

## 2022-04-25 NOTE — ED Notes (Signed)
Pt complaint of headache.

## 2022-04-25 NOTE — Consult Note (Signed)
ANTICOAGULATION CONSULT NOTE - Consult  Pharmacy Consult for warfarin continuation Indication: h/o DVT  Allergies  Allergen Reactions   Codeine Nausea Only   Sulfa Antibiotics Nausea And Vomiting    Patient Measurements: Height: '5\' 9"'$  (175.3 cm) Weight: 92 kg (202 lb 13.2 oz) IBW/kg (Calculated) : 70.7  Vital Signs: Temp: 99.1 F (37.3 C) (06/10 0800) Temp Source: Oral (06/10 0800) BP: 126/50 (06/10 1130) Pulse Rate: 93 (06/10 1130)  Labs: Recent Labs    04/25/22 0512 04/25/22 0713  HGB 13.2  --   HCT 40.9  --   PLT 243  --   APTT 46*  --   LABPROT 23.2*  --   INR 2.1*  --   CREATININE 0.82  --   TROPONINIHS 11 17   Estimated Creatinine Clearance: 85.9 mL/min (by C-G formula based on SCr of 0.82 mg/dL).  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 Baseline H/H & Plts all WNL. Pharmacy consulted for continuation/mgmt of warfarin.  DDI monitoring: Pt is receiving abx (vanc/Cefepime) which can increase INR. Also received Flagyl x1 in ED.  Goal of Therapy:  INR 2-3 Monitor platelets by anticoagulation protocol: Yes   Plan:  INR on intake 6/10 was 2.1, & last dose was 6/09 2200 - '3mg'$ .  Home regimen is Warfarin '3mg'$  MWF & '4mg'$  TTSS (last dose). Will continue home dose currently since at LLN goal range, but monitor closely for need of dose reduction ISO antibiotics. Warfarin '4mg'$  PO tonight INR daily with AM labs  Lorna Dibble 04/25/2022,12:14 PM

## 2022-04-25 NOTE — H&P (Signed)
History and Physical    Patient: [name] [name] IWL:[id number] DOB: 06-Apr-1946 DOA: 04/25/2022 DOS: the patient was seen and examined on 04/25/2022 PCP: Adin Hector, MD  Patient coming from: Home  Chief Complaint:  Chief Complaint  Patient presents with   Fever   HPI: [name] [name] is a 76 y.o. 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 Patient states that he had issues with kidney stones about 3 weeks ago and was admitted to the hospital for obstructive uropathy.  Patient had right ureteroscopy with right ureteral stent placement on 04/06/22 for right mid ureteral calculi.  Per patient he was told he still had kidney stones that needed intervention and was scheduled for an outpatient follow-up. He followed up with urology and underwent cystoscopy, lithotripsy and right ureteral stent exchange on 04/21/22.  He continued to have hematuria but was told that that was not unusual and did well until 04/24/22 when he removed the stent as recommended. Later that evening he started feeling unwell and developed a low-grade fever with a Tmax of 99.7 and chills.  His symptoms improved but worsened overnight when he had a fever of 101, persistent chills, dysuria and frequency which prompted his visit to the ER. He also developed chest pressure that was midsternal, nonradiating and was associated with nausea but no vomiting, no diaphoresis, no palpitation, no dizziness or lightheadedness. He denies having any abdominal pain, no leg swelling, no headache, no blurred vision or focal deficit. Upon arrival to the ER he had a Tmax of 101.8 F, he was tachycardic at 124 and tachypneic   Review of Systems: As mentioned in the history of present illness. All other systems reviewed and are negative. 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   Prostate cancer ([place]) 2018   Treated with radiation   Rectal bleeding    2013, 2020   Past Surgical History:  Procedure Laterality Date   COLONOSCOPY N/A 07/02/2019   Procedure: COLONOSCOPY;  Surgeon: Lin Landsman, MD;  Location: [facility] ENDOSCOPY;  Service: Gastroenterology;  Laterality: N/A;   COLONOSCOPY     2001, 2011   CYSTOSCOPY W/ RETROGRADES Right 04/06/2022   Procedure: CYSTOSCOPY WITH RETROGRADE PYELOGRAM;  Surgeon: Hollice Espy, MD;  Location: [facility] ORS;  Service: Urology;  Laterality: Right;   CYSTOSCOPY/URETEROSCOPY/HOLMIUM LASER/STENT PLACEMENT Right 04/06/2022   Procedure: CYSTOSCOPY/URETEROSCOPY/HOLMIUM LASER/STENT PLACEMENT;  Surgeon: Hollice Espy, MD;  Location: [facility] ORS;  Service: Urology;  Laterality: Right;   CYSTOSCOPY/URETEROSCOPY/HOLMIUM LASER/STENT PLACEMENT Right 04/21/2022   Procedure: CYSTOSCOPY/URETEROSCOPY/HOLMIUM LASER/STENT EXCHANGE;  Surgeon: Abbie Sons, MD;  Location: [facility] ORS;  Service: Urology;  Laterality: Right;   ESOPHAGOGASTRODUODENOSCOPY N/A 07/02/2019   Procedure: ESOPHAGOGASTRODUODENOSCOPY (EGD);  Surgeon: Lin Landsman, MD;  Location: [facility] ENDOSCOPY;  Service: Gastroenterology;  Laterality: N/A;   ESOPHAGOGASTRODUODENOSCOPY  2013   FOOT FRACTURE SURGERY Right 2012   Heel fracture repair/ has metal, fell off ladder  HERNIA REPAIR Right 2005   inguinal   IVC FILTER INSERTION N/A 09/23/2020   Procedure: IVC FILTER INSERTION;  Surgeon: Algernon Huxley, MD;  Location: [facility];  Service: Cardiovascular;  Laterality: N/A;   IVC FILTER REMOVAL N/A 11/13/2020   Procedure: IVC FILTER  REMOVAL;  Surgeon: Algernon Huxley, MD;  Location: [facility];  Service: Cardiovascular;  Laterality: N/A;   TONSILLECTOMY     as a child   TOTAL HIP ARTHROPLASTY Right 10/21/2020   Procedure: TOTAL HIP ARTHROPLASTY;  Surgeon: Dereck Leep, MD;  Location: [facility] ORS;  Service: Orthopedics;  Laterality: Right;   XI ROBOTIC ASSISTED INGUINAL HERNIA REPAIR WITH MESH Right 07/18/2021   Procedure: XI ROBOTIC ASSISTED INGUINAL HERNIA REPAIR WITH MESH;  Surgeon: Benjamine Sprague, DO;  Location: [facility] ORS;  Service: General;  Laterality: Right;   Social History:  reports that he quit smoking about 41 years ago. His smoking use included cigarettes. He has never used smokeless tobacco. He reports that he does not currently use alcohol. He reports that he does not use drugs.  Allergies  Allergen Reactions   Codeine Nausea Only   Sulfa Antibiotics Nausea And Vomiting    Family History  Problem Relation Age of Onset   Heart attack Father     Prior to Admission medications   Medication Sig Start Date End Date Taking? Authorizing Provider  acetaminophen (TYLENOL) 500 MG tablet Take 1,000 mg by mouth at bedtime.    [provider]  Cyanocobalamin 2500 MCG SUBL Take 2,500 mcg by mouth daily. Takes at 11 am    [provider]  Glucosamine-Chondroitin (OSTEO BI-FLEX REGULAR STRENGTH PO) Take 2 tablets by mouth daily. Takes at 11 am    [provider]  ondansetron (ZOFRAN-ODT) 4 MG disintegrating tablet Take 1 tablet (4 mg total) by mouth every 8 (eight) hours as needed for nausea or vomiting. 04/05/22   Alfred Levins, [place], MD  oxyCODONE-acetaminophen (PERCOCET) 5-325 MG tablet Take 1 tablet by mouth every 4 (four) hours as needed. 04/05/22   Rudene Re, MD  rosuvastatin (CRESTOR) 5 MG tablet Take 5 mg by mouth daily with lunch. 03/24/22   [provider]  tamsulosin (FLOMAX) 0.4 MG CAPS capsule Take 0.4 mg by mouth daily with lunch. 04/14/22   [provider]  triamterene-hydrochlorothiazide (MAXZIDE-25) 37.5-25 MG tablet Take 0.5 tablets by mouth daily with lunch. Takes at 11 am 06/02/17   [provider]  warfarin (COUMADIN) 3 MG tablet Take 3 mg by mouth as directed. Takes on Mondays, Weds., and Fridays 01/13/22 01/13/23  [provider]  warfarin (COUMADIN) 4 MG tablet Take 4 mg by mouth as directed. Takes on Tues, Thurs, Sat and Sun. 06/19/19 04/16/22  [provider]    Physical Exam: Vitals:   04/25/22 0602 04/25/22 0640 04/25/22 0700 04/25/22 0800  BP:  (!) 155/69 (!) 144/54 134/64  Pulse:  (!) 111 97 99  Resp:  (!) 26 (!) 23 (!) 21  Temp: (!) 101.8 F (38.8 C)   99.1 F (37.3 C)  TempSrc: Rectal   Oral  SpO2:  93% 92% 95%  Weight:      Height:       Physical Exam Vitals and nursing note reviewed.  Constitutional:      Appearance: Normal appearance.  HENT:     Head: Normocephalic and atraumatic.     Nose: Nose normal.     Mouth/Throat:     Mouth: Mucous membranes  are [place].  Eyes:     Pupils: Pupils are equal, round, and reactive to light.  Cardiovascular:     Rate and Rhythm: Tachycardia present.  Pulmonary:     Effort: Pulmonary effort is normal.     Breath sounds: Normal breath sounds.  Abdominal:     General: Abdomen is flat. Bowel sounds are normal.     Palpations: Abdomen is soft.  Musculoskeletal:        General: Normal range of motion.     Cervical back: Normal range of motion and neck supple.  Skin:    General: Skin is warm and [place].  Neurological:     General: No focal deficit present.     Mental Status: He is alert and oriented to person, place, and time.  Psychiatric:        Mood and Affect: Mood normal.        Behavior: Behavior normal.     Data Reviewed: Relevant notes from primary care and specialist visits, past discharge summaries as available in EHR, including Care Everywhere. Prior diagnostic testing as pertinent to current admission diagnoses Updated medications and  problem lists for reconciliation ED course, including vitals, labs, imaging, treatment and response to treatment Triage notes, nursing and pharmacy notes and ED provider's notes Notable results as noted in HPI Labs reviewed.  Procalcitonin less than 0.10, lactic acid 1.2, troponin 11 >> 17, sodium 139, potassium 3.6, chloride 109, bicarb 23, glucose 115, BUN 14, creatinine 0.82, calcium 9.2, total protein 6.8, albumin 3.6, AST 16, ALT 11, alkaline phosphatase 56, total bilirubin 1.2, PT 23.2, INR 2.1, white count 11.6, hemoglobin 13.2, hematocrit 40.9, platelet count 243 Chest x-ray reviewed by me shows no acute findings CT angio chest/CT scan of abdomen and pelvis with contrast shows low lung volumes with atelectasis. Negative for pulmonary embolism. Atherosclerosis including the coronary arteries. Mild right hydroureteronephrosis with right urothelial inflammation/hyperenhancement. No visible obstruction or renal hemorrhage. Atherosclerosis and colonic diverticulosis. Twelve-lead EKG reviewed by me shows sinus tachycardia with nonspecific intraventricular conduction block There are no new results to review at this time.  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  All questions and concerns have been addressed.  Severity of Illness: The appropriate patient status for this patient is INPATIENT. Inpatient status is judged to be reasonable and necessary in order to provide the required intensity of service to ensure the patient's safety. The patient's presenting symptoms, physical exam findings, and initial radiographic and laboratory data in the context of their chronic comorbidities is felt to place them at high risk for further clinical deterioration. Furthermore, it is not anticipated that the patient will be medically stable for discharge from the hospital within 2 midnights of admission.   *  I certify that at the point of admission it is my clinical judgment that the patient will require inpatient hospital care spanning beyond 2 midnights from the point of admission due to high intensity of service, high risk for further deterioration and high frequency of surveillance required.*  Author: Collier Bullock, MD 04/25/2022 9:30 AM  For on call review [email or web address].

## 2022-04-25 NOTE — Assessment & Plan Note (Signed)
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

## 2022-04-25 NOTE — Sepsis Progress Note (Signed)
Elink following Code Sepsis. 

## 2022-04-25 NOTE — Progress Notes (Signed)
PHARMACY -  BRIEF ANTIBIOTIC NOTE   Pharmacy has received consult(s) for Cefepime, Vancomycin from an ED provider.  The patient's profile has been reviewed for ht/wt/allergies/indication/available labs.    One time order(s) placed for Cefepime 2 gm IV X 1 and Vancomycin 2 gm IV X 1 .   Further antibiotics/pharmacy consults should be ordered by admitting physician if indicated.                       Thank you, Wally Behan D 04/25/2022  5:41 AM

## 2022-04-26 ENCOUNTER — Inpatient Hospital Stay: Payer: Medicare HMO

## 2022-04-26 DIAGNOSIS — N390 Urinary tract infection, site not specified: Secondary | ICD-10-CM | POA: Diagnosis not present

## 2022-04-26 DIAGNOSIS — A419 Sepsis, unspecified organism: Secondary | ICD-10-CM | POA: Diagnosis not present

## 2022-04-26 DIAGNOSIS — N1339 Other hydronephrosis: Secondary | ICD-10-CM

## 2022-04-26 LAB — CBC
HCT: 33.1 % — ABNORMAL LOW (ref 39.0–52.0)
Hemoglobin: 10.8 g/dL — ABNORMAL LOW (ref 13.0–17.0)
MCH: 29.4 pg (ref 26.0–34.0)
MCHC: 32.6 g/dL (ref 30.0–36.0)
MCV: 90.2 fL (ref 80.0–100.0)
Platelets: 180 10*3/uL (ref 150–400)
RBC: 3.67 MIL/uL — ABNORMAL LOW (ref 4.22–5.81)
RDW: 13 % (ref 11.5–15.5)
WBC: 10.3 10*3/uL (ref 4.0–10.5)
nRBC: 0 % (ref 0.0–0.2)

## 2022-04-26 LAB — PROCALCITONIN: Procalcitonin: 0.17 ng/mL

## 2022-04-26 LAB — BASIC METABOLIC PANEL
Anion gap: 5 (ref 5–15)
BUN: 17 mg/dL (ref 8–23)
CO2: 24 mmol/L (ref 22–32)
Calcium: 8.2 mg/dL — ABNORMAL LOW (ref 8.9–10.3)
Chloride: 109 mmol/L (ref 98–111)
Creatinine, Ser: 0.9 mg/dL (ref 0.61–1.24)
GFR, Estimated: >60 mL/min (ref >60)
Glucose, Bld: 115 mg/dL — ABNORMAL HIGH (ref 70–99)
Potassium: 3.3 mmol/L — ABNORMAL LOW (ref 3.5–5.1)
Sodium: 138 mmol/L (ref 135–145)

## 2022-04-26 LAB — PROTIME-INR
INR: 2.8 — ABNORMAL HIGH (ref 0.8–1.2)
Prothrombin Time: 29 seconds — ABNORMAL HIGH (ref 11.4–15.2)

## 2022-04-26 LAB — CORTISOL-AM, BLOOD: Cortisol - AM: 18.1 ug/dL (ref 6.7–22.6)

## 2022-04-26 MED ORDER — LACTULOSE 10 GM/15ML PO SOLN
20.0000 g | Freq: Once | ORAL | Status: AC
  Administered 2022-04-26: 20 g via ORAL
  Filled 2022-04-26: qty 30

## 2022-04-26 MED ORDER — POTASSIUM CHLORIDE 20 MEQ PO PACK
40.0000 meq | PACK | Freq: Once | ORAL | Status: AC
  Administered 2022-04-26: 40 meq via ORAL
  Filled 2022-04-26: qty 2

## 2022-04-26 MED ORDER — SENNOSIDES-DOCUSATE SODIUM 8.6-50 MG PO TABS
1.0000 | ORAL_TABLET | Freq: Two times a day (BID) | ORAL | Status: DC
Start: 2022-04-26 — End: 2022-04-27
  Administered 2022-04-26: 1 via ORAL
  Filled 2022-04-26 (×2): qty 1

## 2022-04-26 MED ORDER — WARFARIN SODIUM 3 MG PO TABS
3.0000 mg | ORAL_TABLET | Freq: Once | ORAL | Status: AC
Start: 2022-04-26 — End: 2022-04-26
  Administered 2022-04-26: 3 mg via ORAL
  Filled 2022-04-26: qty 1

## 2022-04-26 NOTE — Consult Note (Signed)
ANTICOAGULATION CONSULT NOTE - Consult  Pharmacy Consult for warfarin continuation Indication: h/o DVT  Allergies  Allergen Reactions   Codeine Nausea Only   Sulfa Antibiotics Nausea And Vomiting    Patient Measurements: Height: '5\' 9"'$  (175.3 cm) Weight: 92 kg (202 lb 13.2 oz) IBW/kg (Calculated) : 70.7  Vital Signs: Temp: 100.7 F (38.2 C) (06/11 1236) Temp Source: Oral (06/11 1236) BP: 148/65 (06/11 1236) Pulse Rate: 75 (06/11 1236)  Labs: Recent Labs    04/25/22 0512 04/25/22 0713 04/26/22 0427  HGB 13.2  --  10.8*  HCT 40.9  --  33.1*  PLT 243  --  180  APTT 46*  --   --   LABPROT 23.2*  --  29.0*  INR 2.1*  --  2.8*  CREATININE 0.82  --  0.90  TROPONINIHS 11 17  --     Estimated Creatinine Clearance: 78.2 mL/min (by C-G formula based on SCr of 0.9 mg/dL).  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 Baseline H/H & Plts all WNL. Pharmacy consulted for continuation/mgmt of warfarin.  DDI monitoring: Pt is receiving abx (vanc/Cefepime) which can increase INR. Also received Flagyl x1 in ED.   6/10 INR 2.1 4 mg 6/11 INR 2.8  Goal of Therapy:  INR 2-3 Monitor platelets by anticoagulation protocol: Yes   Plan:  INR therapeutic but with a jump. Will order Warfarin 3 mg tonight monitor closely for need of dose reduction ISO antibiotics. F/u for return to home schedule if possible INR daily with AM labs  Donovan Gatchel A 04/26/2022,1:30 PM

## 2022-04-26 NOTE — Progress Notes (Addendum)
1030 Pt sitting up in chair tele d/c   1400 Pt returned to room from ultrasound. Wife at bedside possessions and call bell within reach

## 2022-04-26 NOTE — Plan of Care (Signed)
  Problem: Fluid Volume: Goal: Hemodynamic stability will improve 04/26/2022 0837 by Celso Amy, RN Outcome: Progressing 04/26/2022 0837 by Celso Amy, RN Outcome: Progressing   Problem: Clinical Measurements: Goal: Diagnostic test results will improve 04/26/2022 0837 by Celso Amy, RN Outcome: Progressing 04/26/2022 0837 by Celso Amy, RN Outcome: Progressing Goal: Signs and symptoms of infection will decrease Outcome: Progressing   Problem: Respiratory: Goal: Ability to maintain adequate ventilation will improve 04/26/2022 0837 by Celso Amy, RN Outcome: Progressing 04/26/2022 0837 by Celso Amy, RN Outcome: Progressing   Problem: Education: Goal: Knowledge of General Education information will improve Description: Including pain rating scale, medication(s)/side effects and non-pharmacologic comfort measures Outcome: Progressing   Problem: Health Behavior/Discharge Planning: Goal: Ability to manage health-related needs will improve Outcome: Progressing   Problem: Clinical Measurements: Goal: Ability to maintain clinical measurements within normal limits will improve 04/26/2022 0837 by Celso Amy, RN Outcome: Progressing 04/26/2022 0837 by Celso Amy, RN Outcome: Progressing Goal: Will remain free from infection Outcome: Progressing Goal: Diagnostic test results will improve Outcome: Progressing Goal: Respiratory complications will improve Outcome: Progressing Goal: Cardiovascular complication will be avoided Outcome: Progressing   Problem: Activity: Goal: Risk for activity intolerance will decrease 04/26/2022 0837 by Celso Amy, RN Outcome: Progressing 04/26/2022 0837 by Celso Amy, RN Outcome: Progressing   Problem: Nutrition: Goal: Adequate nutrition will be maintained Outcome: Progressing   Problem: Coping: Goal: Level of anxiety will decrease 04/26/2022 0837 by Celso Amy, RN Outcome:  Progressing 04/26/2022 0837 by Celso Amy, RN Outcome: Progressing   Problem: Elimination: Goal: Will not experience complications related to bowel motility Outcome: Progressing Goal: Will not experience complications related to urinary retention Outcome: Progressing   Problem: Pain Managment: Goal: General experience of comfort will improve Outcome: Progressing   Problem: Safety: Goal: Ability to remain free from injury will improve Outcome: Progressing   Problem: Skin Integrity: Goal: Risk for impaired skin integrity will decrease Outcome: Progressing

## 2022-04-26 NOTE — Progress Notes (Signed)
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  Patient reports having history of kidney stone about 3 weeks ago and was admitted for obstructive uropathy.  Patient underwent right ureteroscopy with right ureteral stent placement on 04/06/2022 for right mid ureteral calculi.  He also underwent cystoscopy, lithotripsy and right ureteral stent exchange on 04/21/2022.  Subsequently the stent was removed on 04/24/2022.  Patient has not been feeling well and has developed low-grade fever.  Yesterday he has 101.9 fever associated with chills and dysuria and increased frequency.  Patient also reported having unusual chest pressure associated with nausea but denies any vomiting. Patient is admitted for severe sepsis possibly secondary to UTI following recent urological instrumentation.  Patient started on empiric IV antibiotics.  Assessment & Plan:   Principal Problem:   Sepsis ([place]) Active Problems:   Hypertension   History of DVT (deep vein thrombosis)  Sepsis secondary to UTI: Patient presented with fever, tachycardia, tachypnea, leukocytosis and pyuria. Lactic acid 1.2, procalcitonin 0.17, WBC 11.6, UA: Cloudy, hb+, LE large, nitrites+ Initiated on empiric antibiotics  IV cefepime. Patient has a history of nephrolithiasis and is status post recent instrumentation(cystoscopy, right ureteroscopy, stone removal and, right ureteral stent exchange and removal). Continue IV antibiotic IV cefepime.  Follow blood and urine cultures. CTA chest/abdomen: No evidence of PE. Mild right hydroureteronephrosis with right urothelial inflammation/hyperenhancement. No visible obstruction or renal hemorrhage. Urology is  consulted, awaiting recommendation.  Essential hypertension: Blood pressure normal off medication.  Hold maxide for now.  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 Pharmacy consulted to continue Coumadin.  History of kidney stones: Patient is status post recent urological instrumentation. F/u Urology.  Hyperlipidemia: Continue Crestor 5 mg daily.  BPH: Continue Flomax 0.4 mg daily  DVT prophylaxis: Coumadin Code Status: Full code Family Communication: Wife at bedside Disposition Plan:   Status is: Inpatient Remains inpatient appropriate because: Admitted for severe sepsis secondary to UTI following urological instrumentation requiring IV antibiotics.   Consultants:  Urology  Procedures: CTA chest and abdomen Antimicrobials:  Anti-infectives (From admission, onward)    Start     Dose/Rate Route Frequency Ordered Stop   04/25/22 1400  ceFEPIme (MAXIPIME) 2 g in sodium chloride 0.9 % 100 mL IVPB  Status:  Discontinued        2 g 200 mL/hr over 30 Minutes Intravenous  Once 04/25/22 0909 04/25/22 0936   04/25/22 1400  ceFEPIme (MAXIPIME) 2 g in sodium chloride 0.9 % 100 mL IVPB        2 g 200 mL/hr over 30 Minutes Intravenous Every 8 hours 04/25/22 0936 05/02/22 1359   04/25/22 0545  vancomycin (VANCOREADY) IVPB 2000 mg/400 mL        2,000 mg 200 mL/hr over 120 Minutes Intravenous  Once 04/25/22 0540 04/25/22 0950   04/25/22 0530  ceFEPIme (MAXIPIME) 2 g in sodium chloride 0.9 % 100 mL IVPB        2 g 200 mL/hr over 30 Minutes Intravenous  Once 04/25/22 0520 04/25/22 0636   04/25/22 0530  metroNIDAZOLE (FLAGYL) IVPB 500 mg        500 mg 100 mL/hr over 60 Minutes  Intravenous  Once 04/25/22 0520 04/25/22 0738   04/25/22 0530  vancomycin (VANCOCIN) IVPB 1000 mg/200 mL premix  Status:  Discontinued        1,000 mg 200 mL/hr over 60 Minutes Intravenous  Once 04/25/22 0520 04/25/22 0540         Subjective: Patient was seen and examined at bedside.  He was lying comfortably in the bed.   Wife reports he has pain in the right flank but getting better.  He has been having fever at home.  Objective: Vitals:   04/25/22 1945 04/25/22 2347 04/26/22 0343 04/26/22 0819  BP: (!) 135/53 (!) 151/49 (!) 150/54 (!) 116/45  Pulse: 74 71 78 66  Resp: '17 17 17 16  '$ Temp: 99.7 F (37.6 C) 99.8 F (37.7 C) 99.6 F (37.6 C) 99.2 F (37.3 C)  TempSrc:  Oral  Oral  SpO2: 97% 91% 94% 95%  Weight:      Height:        Intake/Output Summary (Last 24 hours) at 04/26/2022 1008 Last data filed at 04/26/2022 0100 Gross per 24 hour  Intake 480 ml  Output 450 ml  Net 30 ml   Filed Weights   04/25/22 0455  Weight: 92 kg    Examination:  General exam: Appears comfortable, not in any acute distress.  lying comfortably Respiratory system: CTA bilaterally, no wheezing, no crackles, normal respiratory effort. Cardiovascular system: S1-S2 heard, regular rate and rhythm, no murmur. Gastrointestinal system: Abdomen is soft, non tender, nondistended, normal bowel sounds. Central nervous system: Alert and oriented x 3 . No focal neurological deficits. Extremities: No edema, no cyanosis, no clubbing. Skin: No rashes, lesions or ulcers Psychiatry: Judgement and insight appear normal. Mood & affect appropriate.     Data Reviewed: I have personally reviewed following labs and imaging studies  CBC: Recent Labs  Lab 04/20/22 0622 04/25/22 0512 04/26/22 0427  WBC 7.0 11.6* 10.3  NEUTROABS  --  10.2*  --   HGB 13.1 13.2 10.8*  HCT 40.9 40.9 33.1*  MCV 90.7 91.7 90.2  PLT 315 243 510   Basic Metabolic Panel: Recent Labs  Lab 04/20/22 0622 04/25/22 0512 04/26/22 0427  NA 140 139 138  K 3.3* 3.6 3.3*  CL 107 109 109  CO2 '27 23 24  '$ GLUCOSE 112* 115* 115*  BUN '19 14 17  '$ CREATININE 0.95 0.82 0.90  CALCIUM 9.2 9.2 8.2*   GFR: Estimated Creatinine Clearance: 78.2 mL/min (by C-G formula  based on SCr of 0.9 mg/dL). Liver Function Tests: Recent Labs  Lab 04/25/22 0512  AST 16  ALT 11  ALKPHOS 56  BILITOT 1.2  PROT 6.8  ALBUMIN 3.6   Recent Labs  Lab 04/25/22 0512  LIPASE 29   No results for input(s): "AMMONIA" in the last 168 hours. Coagulation Profile: Recent Labs  Lab 04/21/22 1011 04/25/22 0512 04/26/22 0427  INR 3.4* 2.1* 2.8*   Cardiac Enzymes: No results for input(s): "CKTOTAL", "CKMB", "CKMBINDEX", "TROPONINI" in the last 168 hours. BNP (last 3 results) No results for input(s): "PROBNP" in the last 8760 hours. HbA1C: No results for input(s): "HGBA1C" in the last 72 hours. CBG: No results for input(s): "GLUCAP" in the last 168 hours. Lipid Profile: No results for input(s): "CHOL", "HDL", "LDLCALC", "TRIG", "CHOLHDL", "LDLDIRECT" in the last 72 hours. Thyroid Function Tests: No results for input(s): "TSH", "T4TOTAL", "FREET4", "T3FREE", "THYROIDAB" in the last 72 hours. Anemia Panel: No results for input(s): "VITAMINB12", "FOLATE", "FERRITIN", "TIBC", "IRON", "RETICCTPCT" in the last  72 hours. Sepsis Labs: Recent Labs  Lab 04/25/22 0512 04/26/22 0427  PROCALCITON <0.10 0.17  LATICACIDVEN 1.2  --     Recent Results (from the past 240 hour(s))  Resp Panel by RT-PCR (Flu A&B, Covid) Anterior Nasal Swab     Status: None   Collection Time: 04/25/22  5:11 AM   Specimen: Anterior Nasal Swab  Result Value Ref Range Status   SARS Coronavirus 2 by RT PCR NEGATIVE NEGATIVE Final    Comment: (NOTE) SARS-CoV-2 target nucleic acids are NOT DETECTED.  The SARS-CoV-2 RNA is generally detectable in upper respiratory specimens during the acute phase of infection. The lowest concentration of SARS-CoV-2 viral copies this assay can detect is 138 copies/mL. A negative result does not preclude SARS-Cov-2 infection and should not be used as the sole basis for treatment or other patient management decisions. A negative result may occur with  improper  specimen collection/handling, submission of specimen other than nasopharyngeal swab, presence of viral mutation(s) within the areas targeted by this assay, and inadequate number of viral copies(<138 copies/mL). A negative result must be combined with clinical observations, patient history, and epidemiological information. The expected result is Negative.  Fact Sheet for Patients:  EntrepreneurPulse.com.au  Fact Sheet for Healthcare Providers:  IncredibleEmployment.be  This test is no t yet approved or cleared by the [place] FDA and  has been authorized for detection and/or diagnosis of SARS-CoV-2 by FDA under an Emergency Use Authorization (EUA). This EUA will remain  in effect (meaning this test can be used) for the duration of the COVID-19 declaration under Section 564(b)(1) of the Act, 21 U.S.C.section 360bbb-3(b)(1), unless the authorization is terminated  or revoked sooner.       Influenza A by PCR NEGATIVE NEGATIVE Final   Influenza B by PCR NEGATIVE NEGATIVE Final    Comment: (NOTE) The Xpert Xpress SARS-CoV-2/FLU/RSV plus assay is intended as an aid in the diagnosis of influenza from Nasopharyngeal swab specimens and should not be used as a sole basis for treatment. Nasal washings and aspirates are unacceptable for Xpert Xpress SARS-CoV-2/FLU/RSV testing.  Fact Sheet for Patients: EntrepreneurPulse.com.au  Fact Sheet for Healthcare Providers: IncredibleEmployment.be  This test is not yet approved or cleared by the [place] FDA and has been authorized for detection and/or diagnosis of SARS-CoV-2 by FDA under an Emergency Use Authorization (EUA). This EUA will remain in effect (meaning this test can be used) for the duration of the COVID-19 declaration under Section 564(b)(1) of the Act, 21 U.S.C. section 360bbb-3(b)(1), unless the authorization is terminated or revoked.  Performed at  [facility], [address] [place], [place] [address]   Culture, blood (Routine x 2)     Status: None (Preliminary result)   Collection Time: 04/25/22  5:12 AM   Specimen: BLOOD  Result Value Ref Range Status   Specimen Description BLOOD RIGHT ANTECUBITAL  Final   Special Requests   Final    BOTTLES DRAWN AEROBIC AND ANAEROBIC Blood Culture adequate volume   Culture   Final    NO GROWTH 1 DAY Performed at [facility], [address], [place], [place] [address]    Report Status PENDING  Incomplete  Urine Culture     Status: Abnormal (Preliminary result)   Collection Time: 04/25/22  5:13 AM   Specimen: In/Out Cath Urine  Result Value Ref Range Status   Specimen Description   Final    IN/OUT CATH URINE Performed at [facility], [address],  [place], [place] [address]    Special Requests   Final    NONE Performed at [facility], [address] [place], [place] [address]    Culture (A)  Final    >=100,000 COLONIES/mL PSEUDOMONAS AERUGINOSA SUSCEPTIBILITIES TO FOLLOW Performed at [facility], [place] [address], [place], [place] [address]    Report Status PENDING  Incomplete  Culture, blood (Routine x 2)     Status: None (Preliminary result)   Collection Time: 04/25/22  5:57 AM   Specimen: BLOOD  Result Value Ref Range Status   Specimen Description BLOOD BLOOD RIGHT FOREARM  Final   Special Requests   Final    BOTTLES DRAWN AEROBIC AND ANAEROBIC Blood Culture results may not be optimal due to an excessive volume of blood received in culture bottles   Culture   Final    NO GROWTH 1 DAY Performed at [facility], [address], [place], [place] [address]    Report Status PENDING  Incomplete    Radiology Studies: CT Angio Chest PE W and/or Wo Contrast  Result Date: 04/25/2022 CLINICAL DATA:  Acute nonlocalized abdominal pain. Chest pressure and fever. EXAM: CT ANGIOGRAPHY CHEST CT ABDOMEN AND PELVIS WITH CONTRAST  TECHNIQUE: Multidetector CT imaging of the chest was performed using the standard protocol during bolus administration of intravenous contrast. Multiplanar CT image reconstructions and MIPs were obtained to evaluate the vascular anatomy. Multidetector CT imaging of the abdomen and pelvis was performed using the standard protocol during bolus administration of intravenous contrast. RADIATION DOSE REDUCTION: This exam was performed according to the departmental dose-optimization program which includes automated exposure control, adjustment of the mA and/or kV according to patient size and/or use of iterative reconstruction technique. CONTRAST:  112m OMNIPAQUE IOHEXOL 350 MG/ML SOLN COMPARISON:  04/05/2022 abdominal CT FINDINGS: CTA CHEST FINDINGS Cardiovascular: Satisfactory opacification of the pulmonary arteries to the segmental level. No evidence of pulmonary embolism when accounting for occasional areas of motion/streak artifact. Normal heart size. No pericardial effusion. Atheromatous calcification of the aorta and coronaries. Mediastinum/Nodes: Negative for adenopathy or mass Lungs/Pleura: Mild dependent atelectasis. There is no edema, consolidation, effusion, or pneumothorax. Musculoskeletal: No acute or aggressive finding. Thoracic spondylosis. Review of the MIP images confirms the above findings. CT ABDOMEN and PELVIS FINDINGS Hepatobiliary: Small hepatic cystic densities.Full gallbladder without inflammation or calcified stone. Pancreas: Unremarkable. Spleen: Unremarkable. Adrenals/Urinary Tract: Negative adrenals. Mild right hydroureteronephrosis with accentuated urothelial enhancement of the right renal pelvis and ureter. No renal hemorrhage or ureteral calculus. Borderline perivesicular fat stranding. Stomach/Bowel: No obstruction. Left colonic diverticulosis. No visible bowel inflammation Vascular/Lymphatic: Atheromatous calcification of the aorta and iliacs. Atheromatous narrowing at the celiac origin.  No acute vascular finding no mass or adenopathy. Reproductive:History of prostate cancer treated with radiotherapy. The prostate is largely obscured by streak artifact from the right hip prosthesis. Fiducial markers are noted. Other: No ascites or pneumoperitoneum. Musculoskeletal: Right hip arthroplasty and ordinary lumbar spine degeneration. No acute or aggressive finding Review of the MIP images confirms the above findings. IMPRESSION: Chest CTA: 1. Low lung volumes with atelectasis. 2. Negative for pulmonary embolism. 3. Atherosclerosis including the coronary arteries. Abdominal CT: 1. Mild right hydroureteronephrosis with right urothelial inflammation/hyperenhancement. No visible obstruction or renal hemorrhage. 2. Atherosclerosis and colonic diverticulosis. Electronically Signed   By: JJorje GuildM.D.   On: 04/25/2022 07:25   CT ABDOMEN PELVIS W CONTRAST  Result Date: 04/25/2022 CLINICAL DATA:  Acute nonlocalized abdominal pain. Chest pressure and fever. EXAM: CT ANGIOGRAPHY  CHEST CT ABDOMEN AND PELVIS WITH CONTRAST TECHNIQUE: Multidetector CT imaging of the chest was performed using the standard protocol during bolus administration of intravenous contrast. Multiplanar CT image reconstructions and MIPs were obtained to evaluate the vascular anatomy. Multidetector CT imaging of the abdomen and pelvis was performed using the standard protocol during bolus administration of intravenous contrast. RADIATION DOSE REDUCTION: This exam was performed according to the departmental dose-optimization program which includes automated exposure control, adjustment of the mA and/or kV according to patient size and/or use of iterative reconstruction technique. CONTRAST:  143m OMNIPAQUE IOHEXOL 350 MG/ML SOLN COMPARISON:  04/05/2022 abdominal CT FINDINGS: CTA CHEST FINDINGS Cardiovascular: Satisfactory opacification of the pulmonary arteries to the segmental level. No evidence of pulmonary embolism when accounting for  occasional areas of motion/streak artifact. Normal heart size. No pericardial effusion. Atheromatous calcification of the aorta and coronaries. Mediastinum/Nodes: Negative for adenopathy or mass Lungs/Pleura: Mild dependent atelectasis. There is no edema, consolidation, effusion, or pneumothorax. Musculoskeletal: No acute or aggressive finding. Thoracic spondylosis. Review of the MIP images confirms the above findings. CT ABDOMEN and PELVIS FINDINGS Hepatobiliary: Small hepatic cystic densities.Full gallbladder without inflammation or calcified stone. Pancreas: Unremarkable. Spleen: Unremarkable. Adrenals/Urinary Tract: Negative adrenals. Mild right hydroureteronephrosis with accentuated urothelial enhancement of the right renal pelvis and ureter. No renal hemorrhage or ureteral calculus. Borderline perivesicular fat stranding. Stomach/Bowel: No obstruction. Left colonic diverticulosis. No visible bowel inflammation Vascular/Lymphatic: Atheromatous calcification of the aorta and iliacs. Atheromatous narrowing at the celiac origin. No acute vascular finding no mass or adenopathy. Reproductive:History of prostate cancer treated with radiotherapy. The prostate is largely obscured by streak artifact from the right hip prosthesis. Fiducial markers are noted. Other: No ascites or pneumoperitoneum. Musculoskeletal: Right hip arthroplasty and ordinary lumbar spine degeneration. No acute or aggressive finding Review of the MIP images confirms the above findings. IMPRESSION: Chest CTA: 1. Low lung volumes with atelectasis. 2. Negative for pulmonary embolism. 3. Atherosclerosis including the coronary arteries. Abdominal CT: 1. Mild right hydroureteronephrosis with right urothelial inflammation/hyperenhancement. No visible obstruction or renal hemorrhage. 2. Atherosclerosis and colonic diverticulosis. Electronically Signed   By: JJorje GuildM.D.   On: 04/25/2022 07:25   DG Chest 1 View  Result Date: 04/25/2022 CLINICAL  DATA:  Chest pressure and fever. History of lithotripsy this week EXAM: CHEST  1 VIEW COMPARISON:  11/14/2010 FINDINGS: Low volume chest. There is no edema, consolidation, effusion, or pneumothorax. Normal heart size and mild aortic tortuosity. IMPRESSION: Low volume chest without acute finding. Electronically Signed   By: JJorje GuildM.D.   On: 04/25/2022 06:08    Scheduled Meds:  potassium chloride  40 mEq Oral Once   rosuvastatin  5 mg Oral Q lunch   senna-docusate  1 tablet Oral BID   tamsulosin  0.4 mg Oral Q lunch   vitamin B-12  2,500 mcg Oral Daily   [START ON 04/27/2022] warfarin  3 mg Oral Once per day on Mon Wed Fri   And   warfarin  4 mg Oral Once per day on Sun Tue Thu Sat   Warfarin - Pharmacist Dosing Inpatient   Does not apply q1600   Continuous Infusions:  ceFEPime (MAXIPIME) IV 2 g (04/26/22 0526)     LOS: 1 day    Time spent: 50 mins    Lyrical Sowle, MD [facility]   If 7PM-7AM, please contact night-coverage

## 2022-04-26 NOTE — TOC Initial Note (Signed)
Transition of Care [facility]) - Initial/Assessment Note    Patient Details  Name: ROLF FELLS MRN: [id number] Date of Birth: 1946/06/02  [facility] CM/SW Contact:    Magnus Ivan, LCSW Phone Number: 04/26/2022, 11:12 AM  Clinical Narrative:                CSW spoke with patient for high readmission risk assessment. Patient lives with his wife. Patient usually drives himself. PCP is Dr. Caryl Comes. [facility]. Patient has a RW, cane, BSC, and shower chair. Patient had HH in the past through [facility]). TOC to follow for needs.    Expected Discharge Plan: [place] Barriers to Discharge: Continued Medical Work up   Patient Goals and CMS Choice Patient states their goals for this hospitalization and ongoing recovery are:: home with spouse CMS Medicare.gov Compare Post Acute Care list provided to:: Patient Choice offered to / list presented to : Patient  Expected Discharge Plan and Services Expected Discharge Plan: [place]       Living arrangements for the past 2 months: Single Family Home                                      Prior Living Arrangements/Services Living arrangements for the past 2 months: Single Family Home Lives with:: Spouse Patient language and need for interpreter reviewed:: Yes Do you feel safe going back to the place where you live?: Yes      Need for Family Participation in Patient Care: Yes (Comment) Care giver support system in place?: Yes (comment) Current home services: DME Criminal Activity/Legal Involvement Pertinent to Current Situation/Hospitalization: No - Comment as needed  Activities of Daily Living Home Assistive Devices/Equipment: None ADL Screening (condition at time of admission) Patient's cognitive ability adequate to safely complete daily activities?: Yes Is the patient deaf or have difficulty hearing?: No Does the patient have difficulty seeing, even  when wearing glasses/contacts?: No Does the patient have difficulty concentrating, remembering, or making decisions?: No Patient able to express need for assistance with ADLs?: Yes Does the patient have difficulty dressing or bathing?: No Independently performs ADLs?: Yes (appropriate for developmental age) Does the patient have difficulty walking or climbing stairs?: No Weakness of Legs: None Weakness of Arms/Hands: None  Permission Sought/Granted Permission sought to share information with : Facility Sport and exercise psychologist, Family Supports Permission granted to share information with : Yes, Verbal Permission Granted     Permission granted to share info w AGENCY: if needed        Emotional Assessment       Orientation: : Oriented to Self, Oriented to Place, Oriented to  Time, Oriented to Situation Alcohol / Substance Use: Not Applicable Psych Involvement: No (comment)  Admission diagnosis:  Acute UTI [N39.0] Sepsis [facility]) [A41.9] Patient Active Problem List   Diagnosis Date Noted   Sepsis ([place]) 65/78/4696   Ureteral colic    Acute unilateral obstructive uropathy 04/06/2022   Dyslipidemia 04/06/2022   AKI (acute kidney injury) ([place]) 04/06/2022   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   Obesity (BMI 30-39.9) 04/06/2022   Anxiety 04/06/2022   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 [id number]: [phone]: [phone]    Social Determinants of Health (SDOH) Interventions    Readmission Risk Interventions    04/26/2022   11:11 AM  Readmission Risk Prevention Plan  Transportation Screening Complete  PCP or Specialist Appt within 3-5 Days Complete  [place] or [place]  Social Work [place] for [place] Complete  Palliative Care Screening Not Applicable  Medication Review (Press photographer Complete

## 2022-04-26 NOTE — Consult Note (Signed)
Urology Consult  Requesting physician: Shawna Clamp, MD  Reason for consultation: Sepsis with recent urologic surgery   History of Present Illness: [name] [name] is a 76 y.o. male status post uncomplicated right by me 07/18/3299.  Ureteral stent placed and he removed via stent tether as instructed on 04/24/2022.  He felt much better after stent removal and ~ 24 hours after removing began to complain of malaise, cough, congestion, abdominal pain and temp 101.1 degrees.  Presented to the ED and evaluation remarkable for leukocytosis 11.6.  Lactic acid normal 1.2.  UA yellow cloudy, nitrite +; >50 RBC/WBC.  CT abdomen/pelvis with contrast was performed which showed mild right hydronephrosis with hydroureter to the UVJ without calculi seen.    Preliminary urine culture growing Pseudomonas.  Preoperative urine culture was negative.  Still some malaise though feeling much better since admission.  Denies flank or abdominal pain.  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   Prostate cancer ([place]) 2018   Treated with radiation   Rectal bleeding    2013, 2020    Past Surgical History:  Procedure Laterality Date   COLONOSCOPY N/A 07/02/2019   Procedure: COLONOSCOPY;  Surgeon: Lin Landsman, MD;  Location: [facility] ENDOSCOPY;  Service: Gastroenterology;  Laterality: N/A;   COLONOSCOPY     2001, 2011   CYSTOSCOPY W/ RETROGRADES Right 04/06/2022   Procedure: CYSTOSCOPY WITH RETROGRADE PYELOGRAM;  Surgeon: Hollice Espy, MD;  Location: [facility] ORS;  Service: Urology;  Laterality: Right;    CYSTOSCOPY/URETEROSCOPY/HOLMIUM LASER/STENT PLACEMENT Right 04/06/2022   Procedure: CYSTOSCOPY/URETEROSCOPY/HOLMIUM LASER/STENT PLACEMENT;  Surgeon: Hollice Espy, MD;  Location: [facility] ORS;  Service: Urology;  Laterality: Right;   CYSTOSCOPY/URETEROSCOPY/HOLMIUM LASER/STENT PLACEMENT Right 04/21/2022   Procedure: CYSTOSCOPY/URETEROSCOPY/HOLMIUM LASER/STENT EXCHANGE;  Surgeon: Abbie Sons, MD;  Location: [facility] ORS;  Service: Urology;  Laterality: Right;   ESOPHAGOGASTRODUODENOSCOPY N/A 07/02/2019   Procedure: ESOPHAGOGASTRODUODENOSCOPY (EGD);  Surgeon: Lin Landsman, MD;  Location: [facility] ENDOSCOPY;  Service: Gastroenterology;  Laterality: N/A;   ESOPHAGOGASTRODUODENOSCOPY  2013   FOOT FRACTURE SURGERY Right 2012   Heel fracture repair/ has metal, fell off ladder   HERNIA REPAIR Right 2005   inguinal   IVC FILTER INSERTION N/A 09/23/2020   Procedure: IVC FILTER INSERTION;  Surgeon: Algernon Huxley, MD;  Location: [place] CV LAB;  Service: Cardiovascular;  Laterality: N/A;   IVC FILTER REMOVAL N/A 11/13/2020   Procedure: IVC FILTER REMOVAL;  Surgeon: Algernon Huxley, MD;  Location: [place] [facility] LAB;  Service: Cardiovascular;  Laterality: N/A;   TONSILLECTOMY     as a child   TOTAL HIP ARTHROPLASTY Right 10/21/2020   Procedure: TOTAL HIP ARTHROPLASTY;  Surgeon: Dereck Leep, MD;  Location: [facility] ORS;  Service: Orthopedics;  Laterality: Right;   XI ROBOTIC ASSISTED INGUINAL HERNIA REPAIR WITH MESH Right 07/18/2021   Procedure: XI ROBOTIC ASSISTED INGUINAL HERNIA REPAIR WITH MESH;  Surgeon: Benjamine Sprague, DO;  Location: [facility] ORS;  Service: General;  Laterality: Right;  Home Medications:  Current Meds  Medication Sig   Cyanocobalamin 2500 MCG SUBL Take 2,500 mcg by mouth daily. Takes at 11 am   Glucosamine-Chondroitin (OSTEO BI-FLEX REGULAR STRENGTH PO) Take 2 tablets by mouth daily. Takes at 11 am   rosuvastatin (CRESTOR) 5 MG tablet Take 5 mg by mouth daily with lunch.    tamsulosin (FLOMAX) 0.4 MG CAPS capsule Take 0.4 mg by mouth daily with lunch.   triamterene-hydrochlorothiazide (MAXZIDE-25) 37.5-25 MG tablet Take 0.5 tablets by mouth daily with lunch. Takes at 11 am   warfarin (COUMADIN) 3 MG tablet Take 3 mg by mouth as directed. Takes on Mondays, Weds., and Fridays    Allergies:  Allergies  Allergen Reactions   Codeine Nausea Only   Sulfa Antibiotics Nausea And Vomiting    Family History  Problem Relation Age of Onset   Heart attack Father     Social History:  reports that he quit smoking about 41 years ago. His smoking use included cigarettes. He has never used smokeless tobacco. He reports that he does not currently use alcohol. He reports that he does not use drugs.  ROS: All systems are negative except for pertinent findings as noted.  Physical Exam:  Vital signs in last 24 hours: Temp:  [98 F (36.7 C)-99.8 F (37.7 C)] 99.2 F (37.3 C) (06/11 0819) Pulse Rate:  [66-93] 66 (06/11 0819) Resp:  [16-17] 16 (06/11 0819) BP: (116-151)/(45-77) 116/45 (06/11 0819) SpO2:  [91 %-98 %] 95 % (06/11 0819) Constitutional:  Alert and oriented, No acute distress HEENT: [place] AT, moist mucus membranes.  Trachea midline, no masses Respiratory: Normal respiratory effort Neurologic: Grossly intact, no focal deficits, moving all 4 extremities Psychiatric: Normal mood and affect   Laboratory Data:  Recent Labs    04/25/22 0512 04/26/22 0427  WBC 11.6* 10.3  HGB 13.2 10.8*  HCT 40.9 33.1*   Recent Labs    04/25/22 0512 04/26/22 0427  NA 139 138  K 3.6 3.3*  CL 109 109  CO2 23 24  GLUCOSE 115* 115*  BUN 14 17  CREATININE 0.82 0.90  CALCIUM 9.2 8.2*   Recent Labs    04/25/22 0512 04/26/22 0427  INR 2.1* 2.8*   No results for input(s): "LABURIN" in the last 72 hours. Results for orders placed or performed during the hospital encounter of 04/25/22  Resp Panel by RT-PCR (Flu A&B, Covid) Anterior Nasal Swab     Status: None    Collection Time: 04/25/22  5:11 AM   Specimen: Anterior Nasal Swab  Result Value Ref Range Status   SARS Coronavirus 2 by RT PCR NEGATIVE NEGATIVE Final    Comment: (NOTE) SARS-CoV-2 target nucleic acids are NOT DETECTED.  The SARS-CoV-2 RNA is generally detectable in upper respiratory specimens during the acute phase of infection. The lowest concentration of SARS-CoV-2 viral copies this assay can detect is 138 copies/mL. A negative result does not preclude SARS-Cov-2 infection and should not be used as the sole basis for treatment or other patient management decisions. A negative result may occur with  improper specimen collection/handling, submission of specimen other than nasopharyngeal swab, presence of viral mutation(s) within the areas targeted by this assay, and inadequate number of viral copies(<138 copies/mL). A negative result must be combined with clinical observations, patient history, and epidemiological information. The expected result is Negative.  Fact Sheet for Patients:  EntrepreneurPulse.com.au  Fact Sheet for Healthcare Providers:  IncredibleEmployment.be  This test is no t yet approved or cleared by  the [employer] and  has been authorized for detection and/or diagnosis of SARS-CoV-2 by FDA under an Emergency Use Authorization (EUA). This EUA will remain  in effect (meaning this test can be used) for the duration of the COVID-19 declaration under Section 564(b)(1) of the Act, 21 U.S.C.section 360bbb-3(b)(1), unless the authorization is terminated  or revoked sooner.       Influenza A by PCR NEGATIVE NEGATIVE Final   Influenza B by PCR NEGATIVE NEGATIVE Final    Comment: (NOTE) The Xpert Xpress SARS-CoV-2/FLU/RSV plus assay is intended as an aid in the diagnosis of influenza from Nasopharyngeal swab specimens and should not be used as a sole basis for treatment. Nasal washings and aspirates are unacceptable for  Xpert Xpress SARS-CoV-2/FLU/RSV testing.  Fact Sheet for Patients: EntrepreneurPulse.com.au  Fact Sheet for Healthcare Providers: IncredibleEmployment.be  This test is not yet approved or cleared by the [place] FDA and has been authorized for detection and/or diagnosis of SARS-CoV-2 by FDA under an Emergency Use Authorization (EUA). This EUA will remain in effect (meaning this test can be used) for the duration of the COVID-19 declaration under Section 564(b)(1) of the Act, 21 U.S.C. section 360bbb-3(b)(1), unless the authorization is terminated or revoked.  Performed at [facility], [address] [place], [place] [address]   Culture, blood (Routine x 2)     Status: None (Preliminary result)   Collection Time: 04/25/22  5:12 AM   Specimen: BLOOD  Result Value Ref Range Status   Specimen Description BLOOD RIGHT ANTECUBITAL  Final   Special Requests   Final    BOTTLES DRAWN AEROBIC AND ANAEROBIC Blood Culture adequate volume   Culture   Final    NO GROWTH 1 DAY Performed at [facility], [address] [place], [place] [address]    Report Status PENDING  Incomplete  Urine Culture     Status: Abnormal (Preliminary result)   Collection Time: 04/25/22  5:13 AM   Specimen: In/Out Cath Urine  Result Value Ref Range Status   Specimen Description   Final    IN/OUT CATH URINE Performed at [facility], [address], [place], [place] [address]    Special Requests   Final    NONE Performed at [facility], [address] [place], [place] [address]    Culture (A)  Final    >=100,000 COLONIES/mL PSEUDOMONAS AERUGINOSA SUSCEPTIBILITIES TO FOLLOW Performed at [facility], [place] [address], [place], [place] [address]    Report Status PENDING  Incomplete  Culture, blood (Routine x 2)     Status: None (Preliminary result)   Collection Time: 04/25/22  5:57 AM   Specimen: BLOOD  Result Value  Ref Range Status   Specimen Description BLOOD BLOOD RIGHT FOREARM  Final   Special Requests   Final    BOTTLES DRAWN AEROBIC AND ANAEROBIC Blood Culture results may not be optimal due to an excessive volume of blood received in culture bottles   Culture   Final    NO GROWTH 1 DAY Performed at [facility], [address] [place],  [address]    Report Status PENDING  Incomplete     Radiologic Imaging: Images CT abdomen/pelvis personally reviewed and interpreted  CT Angio Chest PE W and/or Wo Contrast  Result Date: 04/25/2022 CLINICAL DATA:  Acute nonlocalized abdominal pain. Chest pressure and fever. EXAM: CT ANGIOGRAPHY CHEST CT ABDOMEN AND PELVIS WITH CONTRAST TECHNIQUE: Multidetector CT imaging of the chest  was performed using the standard protocol during bolus administration of intravenous contrast. Multiplanar CT image reconstructions and MIPs were obtained to evaluate the vascular anatomy. Multidetector CT imaging of the abdomen and pelvis was performed using the standard protocol during bolus administration of intravenous contrast. RADIATION DOSE REDUCTION: This exam was performed according to the departmental dose-optimization program which includes automated exposure control, adjustment of the mA and/or kV according to patient size and/or use of iterative reconstruction technique. CONTRAST:  161m OMNIPAQUE IOHEXOL 350 MG/ML SOLN COMPARISON:  04/05/2022 abdominal CT FINDINGS: CTA CHEST FINDINGS Cardiovascular: Satisfactory opacification of the pulmonary arteries to the segmental level. No evidence of pulmonary embolism when accounting for occasional areas of motion/streak artifact. Normal heart size. No pericardial effusion. Atheromatous calcification of the aorta and coronaries. Mediastinum/Nodes: Negative for adenopathy or mass Lungs/Pleura: Mild dependent atelectasis. There is no edema, consolidation, effusion, or pneumothorax. Musculoskeletal: No acute or aggressive  finding. Thoracic spondylosis. Review of the MIP images confirms the above findings. CT ABDOMEN and PELVIS FINDINGS Hepatobiliary: Small hepatic cystic densities.Full gallbladder without inflammation or calcified stone. Pancreas: Unremarkable. Spleen: Unremarkable. Adrenals/Urinary Tract: Negative adrenals. Mild right hydroureteronephrosis with accentuated urothelial enhancement of the right renal pelvis and ureter. No renal hemorrhage or ureteral calculus. Borderline perivesicular fat stranding. Stomach/Bowel: No obstruction. Left colonic diverticulosis. No visible bowel inflammation Vascular/Lymphatic: Atheromatous calcification of the aorta and iliacs. Atheromatous narrowing at the celiac origin. No acute vascular finding no mass or adenopathy. Reproductive:History of prostate cancer treated with radiotherapy. The prostate is largely obscured by streak artifact from the right hip prosthesis. Fiducial markers are noted. Other: No ascites or pneumoperitoneum. Musculoskeletal: Right hip arthroplasty and ordinary lumbar spine degeneration. No acute or aggressive finding Review of the MIP images confirms the above findings. IMPRESSION: Chest CTA: 1. Low lung volumes with atelectasis. 2. Negative for pulmonary embolism. 3. Atherosclerosis including the coronary arteries. Abdominal CT: 1. Mild right hydroureteronephrosis with right urothelial inflammation/hyperenhancement. No visible obstruction or renal hemorrhage. 2. Atherosclerosis and colonic diverticulosis. Electronically Signed   By: JJorje GuildM.D.   On: 04/25/2022 07:25   CT ABDOMEN PELVIS W CONTRAST  Result Date: 04/25/2022 CLINICAL DATA:  Acute nonlocalized abdominal pain. Chest pressure and fever. EXAM: CT ANGIOGRAPHY CHEST CT ABDOMEN AND PELVIS WITH CONTRAST TECHNIQUE: Multidetector CT imaging of the chest was performed using the standard protocol during bolus administration of intravenous contrast. Multiplanar CT image reconstructions and MIPs were  obtained to evaluate the vascular anatomy. Multidetector CT imaging of the abdomen and pelvis was performed using the standard protocol during bolus administration of intravenous contrast. RADIATION DOSE REDUCTION: This exam was performed according to the departmental dose-optimization program which includes automated exposure control, adjustment of the mA and/or kV according to patient size and/or use of iterative reconstruction technique. CONTRAST:  1059mOMNIPAQUE IOHEXOL 350 MG/ML SOLN COMPARISON:  04/05/2022 abdominal CT FINDINGS: CTA CHEST FINDINGS Cardiovascular: Satisfactory opacification of the pulmonary arteries to the segmental level. No evidence of pulmonary embolism when accounting for occasional areas of motion/streak artifact. Normal heart size. No pericardial effusion. Atheromatous calcification of the aorta and coronaries. Mediastinum/Nodes: Negative for adenopathy or mass Lungs/Pleura: Mild dependent atelectasis. There is no edema, consolidation, effusion, or pneumothorax. Musculoskeletal: No acute or aggressive finding. Thoracic spondylosis. Review of the MIP images confirms the above findings. CT ABDOMEN and PELVIS FINDINGS Hepatobiliary: Small hepatic cystic densities.Full gallbladder without inflammation or calcified stone. Pancreas: Unremarkable. Spleen: Unremarkable. Adrenals/Urinary Tract: Negative adrenals. Mild right hydroureteronephrosis with accentuated urothelial enhancement of the right renal pelvis  and ureter. No renal hemorrhage or ureteral calculus. Borderline perivesicular fat stranding. Stomach/Bowel: No obstruction. Left colonic diverticulosis. No visible bowel inflammation Vascular/Lymphatic: Atheromatous calcification of the aorta and iliacs. Atheromatous narrowing at the celiac origin. No acute vascular finding no mass or adenopathy. Reproductive:History of prostate cancer treated with radiotherapy. The prostate is largely obscured by streak artifact from the right hip  prosthesis. Fiducial markers are noted. Other: No ascites or pneumoperitoneum. Musculoskeletal: Right hip arthroplasty and ordinary lumbar spine degeneration. No acute or aggressive finding Review of the MIP images confirms the above findings. IMPRESSION: Chest CTA: 1. Low lung volumes with atelectasis. 2. Negative for pulmonary embolism. 3. Atherosclerosis including the coronary arteries. Abdominal CT: 1. Mild right hydroureteronephrosis with right urothelial inflammation/hyperenhancement. No visible obstruction or renal hemorrhage. 2. Atherosclerosis and colonic diverticulosis. Electronically Signed   By: Jorje Guild M.D.   On: 04/25/2022 07:25   DG Chest 1 View  Result Date: 04/25/2022 CLINICAL DATA:  Chest pressure and fever. History of lithotripsy this week EXAM: CHEST  1 VIEW COMPARISON:  11/14/2010 FINDINGS: Low volume chest. There is no edema, consolidation, effusion, or pneumothorax. Normal heart size and mild aortic tortuosity. IMPRESSION: Low volume chest without acute finding. Electronically Signed   By: Jorje Guild M.D.   On: 04/25/2022 06:08    Impression/Assessment:  76 y.o. male with febrile UTI status post ureteroscopy.  Urine culture growing Pseudomonas. CT with mild hydronephrosis without stone most likely secondary to ureteral edema  Recommendation:  Stable over the past 24 hours with clinical improvement and would not recommend stenting at this time Continue IV antibiotics Renal ultrasound 04/27/2022 Will follow   04/26/2022, 11:21 AM  [name] Giovanni,  MD

## 2022-04-27 DIAGNOSIS — N1339 Other hydronephrosis: Secondary | ICD-10-CM | POA: Diagnosis not present

## 2022-04-27 DIAGNOSIS — N390 Urinary tract infection, site not specified: Secondary | ICD-10-CM | POA: Diagnosis not present

## 2022-04-27 DIAGNOSIS — A419 Sepsis, unspecified organism: Secondary | ICD-10-CM | POA: Diagnosis not present

## 2022-04-27 LAB — BASIC METABOLIC PANEL
Anion gap: 4 — ABNORMAL LOW (ref 5–15)
BUN: 16 mg/dL (ref 8–23)
CO2: 27 mmol/L (ref 22–32)
Calcium: 8.1 mg/dL — ABNORMAL LOW (ref 8.9–10.3)
Chloride: 108 mmol/L (ref 98–111)
Creatinine, Ser: 1.07 mg/dL (ref 0.61–1.24)
GFR, Estimated: >60 mL/min (ref >60)
Glucose, Bld: 102 mg/dL — ABNORMAL HIGH (ref 70–99)
Potassium: 3.8 mmol/L (ref 3.5–5.1)
Sodium: 139 mmol/L (ref 135–145)

## 2022-04-27 LAB — CBC
HCT: 31.9 % — ABNORMAL LOW (ref 39.0–52.0)
Hemoglobin: 10.4 g/dL — ABNORMAL LOW (ref 13.0–17.0)
MCH: 29.6 pg (ref 26.0–34.0)
MCHC: 32.6 g/dL (ref 30.0–36.0)
MCV: 90.9 fL (ref 80.0–100.0)
Platelets: 169 10*3/uL (ref 150–400)
RBC: 3.51 MIL/uL — ABNORMAL LOW (ref 4.22–5.81)
RDW: 13.2 % (ref 11.5–15.5)
WBC: 9.9 10*3/uL (ref 4.0–10.5)
nRBC: 0 % (ref 0.0–0.2)

## 2022-04-27 LAB — PROTIME-INR
INR: 2.8 — ABNORMAL HIGH (ref 0.8–1.2)
Prothrombin Time: 29.1 seconds — ABNORMAL HIGH (ref 11.4–15.2)

## 2022-04-27 LAB — URINE CULTURE: Culture: >=100000 — AB

## 2022-04-27 LAB — PHOSPHORUS: Phosphorus: 2 mg/dL — ABNORMAL LOW (ref 2.5–4.6)

## 2022-04-27 LAB — MAGNESIUM: Magnesium: 1.8 mg/dL (ref 1.7–2.4)

## 2022-04-27 MED ORDER — CIPROFLOXACIN HCL 500 MG PO TABS: 500.0000 mg | ORAL_TABLET | Freq: Two times a day (BID) | ORAL | 0 refills | Status: AC

## 2022-04-27 MED ORDER — K PHOS MONO-SOD PHOS DI & MONO 155-852-130 MG PO TABS: 250.0000 mg | ORAL_TABLET | Freq: Three times a day (TID) | ORAL | 0 refills | Status: DC

## 2022-04-27 MED ORDER — K PHOS MONO-SOD PHOS DI & MONO 155-852-130 MG PO TABS
250.0000 mg | ORAL_TABLET | Freq: Three times a day (TID) | ORAL | Status: DC
  Administered 2022-04-27: 250 mg via ORAL
  Filled 2022-04-27: qty 1

## 2022-04-27 MED ORDER — WARFARIN SODIUM 3 MG PO TABS
3.0000 mg | ORAL_TABLET | Freq: Once | ORAL | Status: DC
  Filled 2022-04-27: qty 1

## 2022-04-27 NOTE — Discharge Summary (Signed)
Physician Discharge Summary  [name] [name] CWC:[id number] DOB: 09-21-1946 DOA: 04/25/2022  PCP: Adin Hector, MD  Admit date: 04/25/2022  Discharge date: 04/27/2022  Admitted From: Home  Disposition:  Home.  Recommendations for Outpatient Follow-up:  Follow up with PCP in 1-2 weeks. Please obtain BMP/CBC in one week. Advised to follow up urology in one week. Advised to take ciprofloxacin 500 mg bid  x 7 days.  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  Patient reports having history of kidney stone about 3 weeks ago and was admitted for obstructive uropathy. Patient underwent right ureteroscopy with right ureteral stent placement on 04/06/2022 for right mid ureteral calculi.  He also underwent cystoscopy, lithotripsy and right ureteral stent exchange on 04/21/2022.  Subsequently the stent was removed on 04/24/2022.  Patient has not been feeling well and has developed low-grade fever.  Yesterday he has 101.9 fever associated with chills and dysuria and increased frequency.  Patient also reported having unusual chest pressure associated with nausea but denies any vomiting. Patient was admitted for sepsis possibly secondary to UTI following recent urological instrumentation.  Patient started on empiric IV antibiotics.  Urology was consulted.  Renal ultrasound shows improved right hydroureteronephrosis.  Urine culture grew Pseudomonas sensitive to ciprofloxacin.  Patient was cleared from urology to be discharged with follow-up outpatient.  Patient feels better has ambulated without any difficulty.  Patient wants to be discharged and being discharged on  ciprofloxacin twice daily for 7 days.  Discharge Diagnoses:  Principal Problem:   Sepsis ([facility]) Active Problems:   Hypertension   History of DVT (deep vein thrombosis)   Other hydronephrosis   Acute UTI  Sepsis secondary to UTI: Patient presented with fever, tachycardia, tachypnea, leukocytosis and pyuria. Lactic acid 1.2, procalcitonin 0.17, WBC 11.6, UA: Cloudy, hb+, LE large, nitrites+ Initiated on empiric antibiotics  IV cefepime. Patient has a history of nephrolithiasis and is status post recent instrumentation(cystoscopy, right ureteroscopy, stone removal and, right ureteral stent exchange and removal). Continue IV antibiotics IV cefepime.  Blood cultures no growth, urine culture grew Pseudomonas sensitive to Cipro. CTA chest/abdomen: No evidence of PE. Mild right hydroureteronephrosis with right urothelial inflammation/hyperenhancement. No visible obstruction or renal hemorrhage. Urology is consulted, renal ultrasound no obstruction. Urology signed off . Patient needs follow-up outpatient   Essential hypertension: Blood pressure normal off medication.   Resume blood pressure medications.   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 Continue Coumadin.   History of kidney stones: Patient is status post recent urological instrumentation. F/u Urology outpatient.   Hyperlipidemia: Continue Crestor 5 mg daily.   BPH: Continue Flomax 0.4 mg daily  Discharge Instructions  Discharge Instructions     Call MD for:  difficulty breathing, headache or visual disturbances   Complete by: As directed    Call MD for:  persistant dizziness or light-headedness   Complete by: As directed    Call MD for:  persistant nausea and vomiting   Complete by: As directed    Diet - low sodium heart healthy   Complete by: As directed    Discharge instructions   Complete by: As directed  Advised to follow up PCP in one  week. Advised to follow up urology in one week. Advised to take ciprofloxacin 500 mg bid  x 7 days.   Discharge wound care:   Complete by: As directed    Follow up PCP   Increase activity slowly   Complete by: As directed       Allergies as of 04/27/2022       Reactions   Codeine Nausea Only   Sulfa Antibiotics Nausea And Vomiting        Medication List     TAKE these medications    acetaminophen 500 MG tablet Commonly known as: TYLENOL Take 1,000 mg by mouth at bedtime.   ciprofloxacin 500 MG tablet Commonly known as: Cipro Take 1 tablet (500 mg total) by mouth 2 (two) times daily for 7 days.   Cyanocobalamin 2500 MCG Subl Take 2,500 mcg by mouth daily. Takes at 11 am   ondansetron 4 MG disintegrating tablet Commonly known as: ZOFRAN-ODT Take 1 tablet (4 mg total) by mouth every 8 (eight) hours as needed for nausea or vomiting.   OSTEO BI-FLEX REGULAR STRENGTH PO Take 2 tablets by mouth daily. Takes at 11 am   oxyCODONE-acetaminophen 5-325 MG tablet Commonly known as: Percocet Take 1 tablet by mouth every 4 (four) hours as needed.   phosphorus 155-852-130 MG tablet Commonly known as: K PHOS NEUTRAL Take 1 tablet (250 mg total) by mouth 3 (three) times daily.   rosuvastatin 5 MG tablet Commonly known as: CRESTOR Take 5 mg by mouth daily with lunch.   tamsulosin 0.4 MG Caps capsule Commonly known as: FLOMAX Take 0.4 mg by mouth daily with lunch.   triamterene-hydrochlorothiazide 37.5-25 MG tablet Commonly known as: MAXZIDE-25 Take 0.5 tablets by mouth daily with lunch. Takes at 11 am   warfarin 4 MG tablet Commonly known as: COUMADIN Take 4 mg by mouth as directed. Takes on Tues, Thurs, Sat and Sun.   warfarin 3 MG tablet Commonly known as: COUMADIN Take 3 mg by mouth as directed. Takes on Mondays, Weds., and Fridays               Discharge Care Instructions  (From admission, onward)           Start     Ordered   04/27/22 0000   Discharge wound care:       Comments: Follow up PCP   04/27/22 1226            Follow-up Information     Tama High III, MD Follow up in 1 week(s).   Specialty: Internal Medicine Contact information: [address] [facility] [place] [address] [phone]         Abbie Sons, MD Follow up in 1 week(s).   Specialty: Urology Contact information: [place] [address] [phone]                Allergies  Allergen Reactions   Codeine Nausea Only   Sulfa Antibiotics Nausea And Vomiting    Consultations: Urology   Procedures/Studies: US RENAL  Result Date: 04/26/2022 CLINICAL DATA:  Hydronephrosis EXAM: RENAL / URINARY TRACT ULTRASOUND COMPLETE COMPARISON:  CT abdomen pelvis, 04/25/2022 FINDINGS: Right Kidney: Renal measurements: 12.1 x 5.5 x 5.8 cm = volume: 202 mL. Echogenicity within normal limits. No mass. Mild right hydronephrosis and proximal hydroureter. Left Kidney: Renal measurements: 11.5 x 5.6 x 5.7 cm = volume: 193 mL. Echogenicity within  normal limits. No mass or hydronephrosis visualized. Bladder: Appears normal for degree of bladder distention. Other: None. IMPRESSION: Mild right hydronephrosis and proximal hydroureter, as seen by prior CT. No calculus or other obstructing etiology identified by ultrasound. Electronically Signed   By: Delanna Ahmadi M.D.   On: 04/26/2022 14:18   CT Angio Chest PE W and/or Wo Contrast  Result Date: 04/25/2022 CLINICAL DATA:  Acute nonlocalized abdominal pain. Chest pressure and fever. EXAM: CT ANGIOGRAPHY CHEST CT ABDOMEN AND PELVIS WITH CONTRAST TECHNIQUE: Multidetector CT imaging of the chest was performed using the standard protocol during bolus administration of intravenous contrast. Multiplanar CT image reconstructions and MIPs were obtained to evaluate the vascular anatomy. Multidetector CT imaging of the abdomen and pelvis was performed using the standard  protocol during bolus administration of intravenous contrast. RADIATION DOSE REDUCTION: This exam was performed according to the departmental dose-optimization program which includes automated exposure control, adjustment of the mA and/or kV according to patient size and/or use of iterative reconstruction technique. CONTRAST:  153m OMNIPAQUE IOHEXOL 350 MG/ML SOLN COMPARISON:  04/05/2022 abdominal CT FINDINGS: CTA CHEST FINDINGS Cardiovascular: Satisfactory opacification of the pulmonary arteries to the segmental level. No evidence of pulmonary embolism when accounting for occasional areas of motion/streak artifact. Normal heart size. No pericardial effusion. Atheromatous calcification of the aorta and coronaries. Mediastinum/Nodes: Negative for adenopathy or mass Lungs/Pleura: Mild dependent atelectasis. There is no edema, consolidation, effusion, or pneumothorax. Musculoskeletal: No acute or aggressive finding. Thoracic spondylosis. Review of the MIP images confirms the above findings. CT ABDOMEN and PELVIS FINDINGS Hepatobiliary: Small hepatic cystic densities.Full gallbladder without inflammation or calcified stone. Pancreas: Unremarkable. Spleen: Unremarkable. Adrenals/Urinary Tract: Negative adrenals. Mild right hydroureteronephrosis with accentuated urothelial enhancement of the right renal pelvis and ureter. No renal hemorrhage or ureteral calculus. Borderline perivesicular fat stranding. Stomach/Bowel: No obstruction. Left colonic diverticulosis. No visible bowel inflammation Vascular/Lymphatic: Atheromatous calcification of the aorta and iliacs. Atheromatous narrowing at the celiac origin. No acute vascular finding no mass or adenopathy. Reproductive:History of prostate cancer treated with radiotherapy. The prostate is largely obscured by streak artifact from the right hip prosthesis. Fiducial markers are noted. Other: No ascites or pneumoperitoneum. Musculoskeletal: Right hip arthroplasty and ordinary  lumbar spine degeneration. No acute or aggressive finding Review of the MIP images confirms the above findings. IMPRESSION: Chest CTA: 1. Low lung volumes with atelectasis. 2. Negative for pulmonary embolism. 3. Atherosclerosis including the coronary arteries. Abdominal CT: 1. Mild right hydroureteronephrosis with right urothelial inflammation/hyperenhancement. No visible obstruction or renal hemorrhage. 2. Atherosclerosis and colonic diverticulosis. Electronically Signed   By: JJorje GuildM.D.   On: 04/25/2022 07:25   CT ABDOMEN PELVIS W CONTRAST  Result Date: 04/25/2022 CLINICAL DATA:  Acute nonlocalized abdominal pain. Chest pressure and fever. EXAM: CT ANGIOGRAPHY CHEST CT ABDOMEN AND PELVIS WITH CONTRAST TECHNIQUE: Multidetector CT imaging of the chest was performed using the standard protocol during bolus administration of intravenous contrast. Multiplanar CT image reconstructions and MIPs were obtained to evaluate the vascular anatomy. Multidetector CT imaging of the abdomen and pelvis was performed using the standard protocol during bolus administration of intravenous contrast. RADIATION DOSE REDUCTION: This exam was performed according to the departmental dose-optimization program which includes automated exposure control, adjustment of the mA and/or kV according to patient size and/or use of iterative reconstruction technique. CONTRAST:  1080mOMNIPAQUE IOHEXOL 350 MG/ML SOLN COMPARISON:  04/05/2022 abdominal CT FINDINGS: CTA CHEST FINDINGS Cardiovascular: Satisfactory opacification of the pulmonary arteries to the segmental level. No evidence of  pulmonary embolism when accounting for occasional areas of motion/streak artifact. Normal heart size. No pericardial effusion. Atheromatous calcification of the aorta and coronaries. Mediastinum/Nodes: Negative for adenopathy or mass Lungs/Pleura: Mild dependent atelectasis. There is no edema, consolidation, effusion, or pneumothorax. Musculoskeletal: No  acute or aggressive finding. Thoracic spondylosis. Review of the MIP images confirms the above findings. CT ABDOMEN and PELVIS FINDINGS Hepatobiliary: Small hepatic cystic densities.Full gallbladder without inflammation or calcified stone. Pancreas: Unremarkable. Spleen: Unremarkable. Adrenals/Urinary Tract: Negative adrenals. Mild right hydroureteronephrosis with accentuated urothelial enhancement of the right renal pelvis and ureter. No renal hemorrhage or ureteral calculus. Borderline perivesicular fat stranding. Stomach/Bowel: No obstruction. Left colonic diverticulosis. No visible bowel inflammation Vascular/Lymphatic: Atheromatous calcification of the aorta and iliacs. Atheromatous narrowing at the celiac origin. No acute vascular finding no mass or adenopathy. Reproductive:History of prostate cancer treated with radiotherapy. The prostate is largely obscured by streak artifact from the right hip prosthesis. Fiducial markers are noted. Other: No ascites or pneumoperitoneum. Musculoskeletal: Right hip arthroplasty and ordinary lumbar spine degeneration. No acute or aggressive finding Review of the MIP images confirms the above findings. IMPRESSION: Chest CTA: 1. Low lung volumes with atelectasis. 2. Negative for pulmonary embolism. 3. Atherosclerosis including the coronary arteries. Abdominal CT: 1. Mild right hydroureteronephrosis with right urothelial inflammation/hyperenhancement. No visible obstruction or renal hemorrhage. 2. Atherosclerosis and colonic diverticulosis. Electronically Signed   By: Jorje Guild M.D.   On: 04/25/2022 07:25   DG Chest 1 View  Result Date: 04/25/2022 CLINICAL DATA:  Chest pressure and fever. History of lithotripsy this week EXAM: CHEST  1 VIEW COMPARISON:  11/14/2010 FINDINGS: Low volume chest. There is no edema, consolidation, effusion, or pneumothorax. Normal heart size and mild aortic tortuosity. IMPRESSION: Low volume chest without acute finding. Electronically Signed    By: Jorje Guild M.D.   On: 04/25/2022 06:08   DG OR UROLOGY CYSTO IMAGE ([facility] ONLY)  Result Date: 04/21/2022 There is no interpretation for this exam.  This order is for images obtained during a surgical procedure.  Please See "Surgeries" Tab for more information regarding the procedure.   DG Abdomen 1 View  Result Date: 04/20/2022 CLINICAL DATA:  Blood in urine. Left nephroureteral stent placement. EXAM: ABDOMEN - 1 VIEW COMPARISON:  04/15/2022 FINDINGS: Right-sided nephroureteral stent is identified. The proximal portion of the stent is at the L2 level and projects over the right L2 transverse process. This is slightly medial when compared with the previous exam which may reflect patient rotation. Increased bowel gas on today's study diminishes ability to identified previous ureteral calculi. IMPRESSION: Right-sided nephroureteral stent in place with position as described above. Increased bowel gas diminishes sensitivity for detecting renal calculi. Electronically Signed   By: Kerby Moors M.D.   On: 04/20/2022 08:03   Abdomen 1 view (KUB)  Result Date: 04/16/2022 CLINICAL DATA:  Right ureteral stones. Right ureteral stent. Lower right sided abdominal pain. EXAM: ABDOMEN - 1 VIEW COMPARISON:  04/06/2022.  CT, 04/05/2022 FINDINGS: Small stone projects in the right mid abdomen, adjacent to the ureteral stent, consistent with the proximal to mid right ureter. Small stone noted in the right kidney below the ureteral stent, not visible on the prior radiograph. No evidence of a left renal or ureteral stone. No evidence of additional right ureteral stone. Normal bowel gas pattern. Stable total hip right arthroplasty. IMPRESSION: 1. New right ureteral stent since the prior exams. The 2 adjacent stones noted on the previous CT have moved, 1 now residing within the right  kidney, the other in the more proximal right ureter, at the L3 level. 2. Small intrarenal stones noted on the prior CT are not visualized  radiographically. Electronically Signed   By: Lajean Manes M.D.   On: 04/16/2022 11:31   DG OR UROLOGY CYSTO IMAGE ([facility] ONLY)  Result Date: 04/06/2022 There is no interpretation for this exam.  This order is for images obtained during a surgical procedure.  Please See "Surgeries" Tab for more information regarding the procedure.   DG Abd 1 View  Result Date: 04/06/2022 CLINICAL DATA:  A 76 year old male presents for evaluation of RIGHT-sided flank pain, recent diagnosis of kidney stone. EXAM: ABDOMEN - 1 VIEW COMPARISON:  CT evaluation from Apr 05, 2022. FINDINGS: EKG leads project over the patient's chest and abdomen. Lung bases are clear. Bowel gas pattern without signs of obstruction with scattered stool and gas throughout the colon. Calcific density projecting just to the RIGHT of L4-5, likely reflects the 2 calculi that were seen in this location on the recent CT evaluation perhaps having migrated very slightly distally on the current exam but still within the middle third of the RIGHT ureter. Signs of RIGHT hip arthroplasty incompletely assessed. No acute skeletal findings on limited assessment. IMPRESSION: 1. Calcific density likely the 2 tiny RIGHT ureteral calculi seen on recent CT imaging may have migrated 1-2 cm but still remain in the mid RIGHT ureter. 2. No signs of bowel obstruction. Electronically Signed   By: Zetta Bills M.D.   On: 04/06/2022 10:14   CT Renal Stone Study  Result Date: 04/05/2022 CLINICAL DATA:  76 year old male with history of flank pain. Suspected kidney stone. EXAM: CT ABDOMEN AND PELVIS WITHOUT CONTRAST TECHNIQUE: Multidetector CT imaging of the abdomen and pelvis was performed following the standard protocol without IV contrast. RADIATION DOSE REDUCTION: This exam was performed according to the departmental dose-optimization program which includes automated exposure control, adjustment of the mA and/or kV according to patient size and/or use of iterative  reconstruction technique. COMPARISON:  CT the abdomen and pelvis 07/03/2021. CT the pelvis 12/22/2021. FINDINGS: Lower chest: Mild linear scarring throughout the lung bases bilaterally. Atherosclerotic calcifications in the thoracic aorta. Hepatobiliary: Several low-attenuation lesions are noted in the left lobe of the liver, incompletely characterized on today's noncontrast CT examination, but statistically likely to represent cysts, largest of which measures 2.5 x 2.1 cm in the central aspect of segment 2 of the liver. No other definite suspicious hepatic lesions are confidently identified on today's noncontrast CT examination. Unenhanced appearance of the gallbladder is normal. Pancreas: No definite pancreatic mass or peripancreatic fluid collections or inflammatory changes are noted on today's noncontrast CT examination. Spleen: Unremarkable. Adrenals/Urinary Tract: Several tiny 1-2 mm nonobstructive calculi are noted within the collecting systems of both kidneys. In the middle third of the right ureter (coronal image 49 of series 5) there are 2 adjacent 3 mm calculi. No additional calculi are noted elsewhere along the course of the right ureter, within the left ureter, or in the lumen of the urinary bladder. At this time, there is very mild proximal right hydroureteronephrosis. No left hydroureteronephrosis. Unenhanced appearance of the kidneys and bilateral adrenal glands is unremarkable. Urinary bladder is partially obscured by extensive beam hardening artifact from the patient's right hip arthroplasty. Visualized portions of the urinary bladder are unremarkable. Stomach/Bowel: The appearance of the stomach is normal. There is no pathologic dilatation of small bowel or colon. Numerous colonic diverticulae are noted, without surrounding inflammatory changes to suggest an  acute diverticulitis at this time. The appendix is not confidently identified and may be surgically absent. Regardless, there are no  inflammatory changes noted adjacent to the cecum to suggest the presence of an acute appendicitis at this time. Vascular/Lymphatic: Atherosclerotic calcifications in the abdominal aorta and pelvic vasculature. Fusiform ectasia of the infrarenal abdominal aorta which measures up to 2.7 cm in diameter. No lymphadenopathy noted in the abdomen or pelvis. Reproductive: Prostate gland and seminal vesicles are largely obscured by beam hardening artifact from the patient's right hip arthroplasty. Other: No significant volume of ascites.  No pneumoperitoneum. Musculoskeletal: Status post right hip arthroplasty. There are no aggressive appearing lytic or blastic lesions noted in the visualized portions of the skeleton. IMPRESSION: 1. In the middle third of the right ureter there are 2 adjacent 3 mm calculi which are associated with mild proximal right hydroureteronephrosis. 2. Multiple additional 1-2 mm nonobstructive calculi in the collecting systems of both kidneys. 3. Colonic diverticulosis without evidence of acute diverticulitis at this time. 4. 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 Recommend follow-up ultrasound every 5 years. This recommendation follows ACR consensus guidelines: White Paper of the ACR Incidental Findings Committee II on Vascular Findings. J Am Coll Radiol 2013; 10:789-794. Electronically Signed   By: Vinnie Langton M.D.   On: 04/05/2022 06:22      Subjective: Patient reports feeling better and wants to be discharged. patient was seen and examined at bedside. Patient remained afebrile for more than 24 hours.  Cleared from urology to be discharged.  Discharge Exam: Vitals:   04/27/22 0409 04/27/22 0835  BP: (!) 135/53 (!) 135/57  Pulse: (!) 52 64  Resp: 18 16  Temp: 97.6 F (36.4 C) 98.5 F (36.9 C)  SpO2: 96% 98%   Vitals:   04/26/22 2036 04/26/22 2348 04/27/22 0409 04/27/22 0835  BP: (!) 157/66 (!)  158/72 (!) 135/53 (!) 135/57  Pulse: 64 66 (!) 52 64  Resp: '17 17 18 16  '$ Temp: 99.6 F (37.6 C) 98.8 F (37.1 C) 97.6 F (36.4 C) 98.5 F (36.9 C)  TempSrc: Oral     SpO2: 97% 96% 96% 98%  Weight:      Height:        General: Pt is alert, awake, not in acute distress Cardiovascular: RRR, S1/S2 +, no rubs, no gallops Respiratory: CTA bilaterally, no wheezing, no rhonchi Abdominal: Soft, NT, ND, bowel sounds + Extremities: no edema, no cyanosis    The results of significant diagnostics from this hospitalization (including imaging, microbiology, ancillary and laboratory) are listed below for reference.     Microbiology: Recent Results (from the past 240 hour(s))  Resp Panel by RT-PCR (Flu A&B, Covid) Anterior Nasal Swab     Status: None   Collection Time: 04/25/22  5:11 AM   Specimen: Anterior Nasal Swab  Result Value Ref Range Status   SARS Coronavirus 2 by RT PCR NEGATIVE NEGATIVE Final    Comment: (NOTE) SARS-CoV-2 target nucleic acids are NOT DETECTED.  The SARS-CoV-2 RNA is generally detectable in upper respiratory specimens during the acute phase of infection. The lowest concentration of SARS-CoV-2 viral copies this assay can detect is 138 copies/mL. A negative result does not preclude SARS-Cov-2 infection and should not be used as the sole basis for treatment or other patient management decisions. A negative result may occur with  improper specimen collection/handling, submission of specimen other than nasopharyngeal swab, presence of viral mutation(s)  within the areas targeted by this assay, and inadequate number of viral copies(<138 copies/mL). A negative result must be combined with clinical observations, patient history, and epidemiological information. The expected result is Negative.  Fact Sheet for Patients:  EntrepreneurPulse.com.au  Fact Sheet for Healthcare Providers:  IncredibleEmployment.be  This test is no t yet  approved or cleared by the [place] FDA and  has been authorized for detection and/or diagnosis of SARS-CoV-2 by FDA under an Emergency Use Authorization (EUA). This EUA will remain  in effect (meaning this test can be used) for the duration of the COVID-19 declaration under Section 564(b)(1) of the Act, 21 U.S.C.section 360bbb-3(b)(1), unless the authorization is terminated  or revoked sooner.       Influenza A by PCR NEGATIVE NEGATIVE Final   Influenza B by PCR NEGATIVE NEGATIVE Final    Comment: (NOTE) The Xpert Xpress SARS-CoV-2/FLU/RSV plus assay is intended as an aid in the diagnosis of influenza from Nasopharyngeal swab specimens and should not be used as a sole basis for treatment. Nasal washings and aspirates are unacceptable for Xpert Xpress SARS-CoV-2/FLU/RSV testing.  Fact Sheet for Patients: EntrepreneurPulse.com.au  Fact Sheet for Healthcare Providers: IncredibleEmployment.be  This test is not yet approved or cleared by the [place] FDA and has been authorized for detection and/or diagnosis of SARS-CoV-2 by FDA under an Emergency Use Authorization (EUA). This EUA will remain in effect (meaning this test can be used) for the duration of the COVID-19 declaration under Section 564(b)(1) of the Act, 21 U.S.C. section 360bbb-3(b)(1), unless the authorization is terminated or revoked.  Performed at [facility], [address] [place], [place] [address]   Culture, blood (Routine x 2)     Status: None (Preliminary result)   Collection Time: 04/25/22  5:12 AM   Specimen: BLOOD  Result Value Ref Range Status   Specimen Description BLOOD RIGHT ANTECUBITAL  Final   Special Requests   Final    BOTTLES DRAWN AEROBIC AND ANAEROBIC Blood Culture adequate volume   Culture   Final    NO GROWTH 2 DAYS Performed at [facility], [address], [place], [place] [address]    Report Status PENDING  Incomplete   Urine Culture     Status: Abnormal   Collection Time: 04/25/22  5:13 AM   Specimen: In/Out Cath Urine  Result Value Ref Range Status   Specimen Description   Final    IN/OUT CATH URINE Performed at [facility], [address], [place], [place] [address]    Special Requests   Final    NONE Performed at [facility], [address] [place],  [address]    Culture >=100,000 COLONIES/mL PSEUDOMONAS AERUGINOSA (A)  Final   Report Status 04/27/2022 FINAL  Final   Organism ID, Bacteria PSEUDOMONAS AERUGINOSA (A)  Final      Susceptibility   Pseudomonas aeruginosa - MIC*    CEFTAZIDIME 2 SENSITIVE Sensitive     CIPROFLOXACIN <=0.25 SENSITIVE Sensitive     GENTAMICIN <=1 SENSITIVE Sensitive     IMIPENEM 1 SENSITIVE Sensitive     PIP/TAZO <=4 SENSITIVE Sensitive     CEFEPIME 2 SENSITIVE Sensitive     * >=100,000 COLONIES/mL PSEUDOMONAS AERUGINOSA  Culture, blood (Routine x 2)     Status: None (Preliminary result)   Collection Time: 04/25/22  5:57 AM   Specimen: BLOOD  Result Value Ref Range Status   Specimen Description BLOOD BLOOD RIGHT FOREARM  Final   Special Requests  Final    BOTTLES DRAWN AEROBIC AND ANAEROBIC Blood Culture results may not be optimal due to an excessive volume of blood received in culture bottles   Culture   Final    NO GROWTH 2 DAYS Performed at [facility], [address] [place], [place] [address]    Report Status PENDING  Incomplete     Labs: BNP (last 3 results) Recent Labs    04/25/22 0512  BNP 20.9   Basic Metabolic Panel: Recent Labs  Lab 04/25/22 0512 04/26/22 0427 04/27/22 0359  NA 139 138 139  K 3.6 3.3* 3.8  CL 109 109 108  CO2 '23 24 27  '$ GLUCOSE 115* 115* 102*  BUN '14 17 16  '$ CREATININE 0.82 0.90 1.07  CALCIUM 9.2 8.2* 8.1*  MG  --   --  1.8  PHOS  --   --  2.0*   Liver Function Tests: Recent Labs  Lab 04/25/22 0512  AST 16  ALT 11  ALKPHOS 56  BILITOT 1.2  PROT 6.8  ALBUMIN 3.6    Recent Labs  Lab 04/25/22 0512  LIPASE 29   No results for input(s): "AMMONIA" in the last 168 hours. CBC: Recent Labs  Lab 04/25/22 0512 04/26/22 0427 04/27/22 0359  WBC 11.6* 10.3 9.9  NEUTROABS 10.2*  --   --   HGB 13.2 10.8* 10.4*  HCT 40.9 33.1* 31.9*  MCV 91.7 90.2 90.9  PLT 243 180 169   Cardiac Enzymes: No results for input(s): "CKTOTAL", "CKMB", "CKMBINDEX", "TROPONINI" in the last 168 hours. BNP: Invalid input(s): "POCBNP" CBG: No results for input(s): "GLUCAP" in the last 168 hours. D-Dimer No results for input(s): "DDIMER" in the last 72 hours. Hgb A1c No results for input(s): "HGBA1C" in the last 72 hours. Lipid Profile No results for input(s): "CHOL", "HDL", "LDLCALC", "TRIG", "CHOLHDL", "LDLDIRECT" in the last 72 hours. Thyroid function studies No results for input(s): "TSH", "T4TOTAL", "T3FREE", "THYROIDAB" in the last 72 hours.  Invalid input(s): "FREET3" Anemia work up No results for input(s): "VITAMINB12", "FOLATE", "FERRITIN", "TIBC", "IRON", "RETICCTPCT" in the last 72 hours. Urinalysis    Component Value Date/Time   COLORURINE YELLOW (A) 04/25/2022 0513   APPEARANCEUR CLOUDY (A) 04/25/2022 0513   APPEARANCEUR Cloudy (A) 04/15/2022 0929   LABSPEC 1.015 04/25/2022 0513   LABSPEC 1.020 10/13/2012 0845   PHURINE 7.0 04/25/2022 0513   GLUCOSEU NEGATIVE 04/25/2022 0513   GLUCOSEU Negative 10/13/2012 0845   HGBUR LARGE (A) 04/25/2022 0513   BILIRUBINUR NEGATIVE 04/25/2022 0513   BILIRUBINUR Negative 04/15/2022 0929   BILIRUBINUR Negative 10/13/2012 0845   KETONESUR 80 (A) 04/25/2022 0513   PROTEINUR 100 (A) 04/25/2022 0513   NITRITE POSITIVE (A) 04/25/2022 0513   LEUKOCYTESUR LARGE (A) 04/25/2022 0513   LEUKOCYTESUR Negative 10/13/2012 0845   Sepsis Labs Recent Labs  Lab 04/25/22 0512 04/26/22 0427 04/27/22 0359  WBC 11.6* 10.3 9.9   Microbiology Recent Results (from the past 240 hour(s))  Resp Panel by RT-PCR (Flu A&B, Covid)  Anterior Nasal Swab     Status: None   Collection Time: 04/25/22  5:11 AM   Specimen: Anterior Nasal Swab  Result Value Ref Range Status   SARS Coronavirus 2 by RT PCR NEGATIVE NEGATIVE Final    Comment: (NOTE) SARS-CoV-2 target nucleic acids are NOT DETECTED.  The SARS-CoV-2 RNA is generally detectable in upper respiratory specimens during the acute phase of infection. The lowest concentration of SARS-CoV-2 viral copies this assay can detect is 138 copies/mL. A negative  result does not preclude SARS-Cov-2 infection and should not be used as the sole basis for treatment or other patient management decisions. A negative result may occur with  improper specimen collection/handling, submission of specimen other than nasopharyngeal swab, presence of viral mutation(s) within the areas targeted by this assay, and inadequate number of viral copies(<138 copies/mL). A negative result must be combined with clinical observations, patient history, and epidemiological information. The expected result is Negative.  Fact Sheet for Patients:  EntrepreneurPulse.com.au  Fact Sheet for Healthcare Providers:  IncredibleEmployment.be  This test is no t yet approved or cleared by the [place] FDA and  has been authorized for detection and/or diagnosis of SARS-CoV-2 by FDA under an Emergency Use Authorization (EUA). This EUA will remain  in effect (meaning this test can be used) for the duration of the COVID-19 declaration under Section 564(b)(1) of the Act, 21 U.S.C.section 360bbb-3(b)(1), unless the authorization is terminated  or revoked sooner.       Influenza A by PCR NEGATIVE NEGATIVE Final   Influenza B by PCR NEGATIVE NEGATIVE Final    Comment: (NOTE) The Xpert Xpress SARS-CoV-2/FLU/RSV plus assay is intended as an aid in the diagnosis of influenza from Nasopharyngeal swab specimens and should not be used as a sole basis for treatment. Nasal washings  and aspirates are unacceptable for Xpert Xpress SARS-CoV-2/FLU/RSV testing.  Fact Sheet for Patients: EntrepreneurPulse.com.au  Fact Sheet for Healthcare Providers: IncredibleEmployment.be  This test is not yet approved or cleared by the [place] FDA and has been authorized for detection and/or diagnosis of SARS-CoV-2 by FDA under an Emergency Use Authorization (EUA). This EUA will remain in effect (meaning this test can be used) for the duration of the COVID-19 declaration under Section 564(b)(1) of the Act, 21 U.S.C. section 360bbb-3(b)(1), unless the authorization is terminated or revoked.  Performed at [facility], [address] [place], [place] [address]   Culture, blood (Routine x 2)     Status: None (Preliminary result)   Collection Time: 04/25/22  5:12 AM   Specimen: BLOOD  Result Value Ref Range Status   Specimen Description BLOOD RIGHT ANTECUBITAL  Final   Special Requests   Final    BOTTLES DRAWN AEROBIC AND ANAEROBIC Blood Culture adequate volume   Culture   Final    NO GROWTH 2 DAYS Performed at [facility], [address] [place], [place] [address]    Report Status PENDING  Incomplete  Urine Culture     Status: Abnormal   Collection Time: 04/25/22  5:13 AM   Specimen: In/Out Cath Urine  Result Value Ref Range Status   Specimen Description   Final    IN/OUT CATH URINE Performed at [facility], [address], [place], [place] [address]    Special Requests   Final    NONE Performed at [facility], [address] [place], [place] [address]    Culture >=100,000 COLONIES/mL PSEUDOMONAS AERUGINOSA (A)  Final   Report Status 04/27/2022 FINAL  Final   Organism ID, Bacteria PSEUDOMONAS AERUGINOSA (A)  Final      Susceptibility   Pseudomonas aeruginosa - MIC*    CEFTAZIDIME 2 SENSITIVE Sensitive     CIPROFLOXACIN <=0.25 SENSITIVE Sensitive     GENTAMICIN <=1 SENSITIVE  Sensitive     IMIPENEM 1 SENSITIVE Sensitive     PIP/TAZO <=4 SENSITIVE Sensitive     CEFEPIME 2 SENSITIVE Sensitive     * >=100,000 COLONIES/mL PSEUDOMONAS AERUGINOSA  Culture, blood (  Routine x 2)     Status: None (Preliminary result)   Collection Time: 04/25/22  5:57 AM   Specimen: BLOOD  Result Value Ref Range Status   Specimen Description BLOOD BLOOD RIGHT FOREARM  Final   Special Requests   Final    BOTTLES DRAWN AEROBIC AND ANAEROBIC Blood Culture results may not be optimal due to an excessive volume of blood received in culture bottles   Culture   Final    NO GROWTH 2 DAYS Performed at [facility], [address], [place], [place] [address]    Report Status PENDING  Incomplete     Time coordinating discharge: Over 30 minutes  SIGNED:   Shawna Clamp, MD  [facility] 04/27/2022, 3:50 PM Pager   If 7PM-7AM, please contact night-coverage

## 2022-04-27 NOTE — Discharge Instructions (Signed)
Advised to follow up PCP in one week. Advised to follow up urology in one week. Advised to take ciprofloxacin 500 mg bid  x 7 days.

## 2022-04-27 NOTE — Progress Notes (Signed)
Urology Inpatient Progress Note  Subjective: No acute events overnight.  He is afebrile, VSS. Renal ultrasound yesterday showed stable mild right hydroureteronephrosis without evidence of obstructing stone. Creatinine remains WNL today.  WBC count down today, 9.9.  Blood cultures pending with no growth at 2 days.  Urine cultures finalized with Pseudomonas, susceptibilities pending.  On antibiotics as below. He is accompanied today by his wife at the bedside.  They report that he is feeling significantly improved today and wonder if he may go home.  Anti-infectives: Anti-infectives (From admission, onward)    Start     Dose/Rate Route Frequency Ordered Stop   04/25/22 1400  ceFEPIme (MAXIPIME) 2 g in sodium chloride 0.9 % 100 mL IVPB  Status:  Discontinued        2 g 200 mL/hr over 30 Minutes Intravenous  Once 04/25/22 0909 04/25/22 0936   04/25/22 1400  ceFEPIme (MAXIPIME) 2 g in sodium chloride 0.9 % 100 mL IVPB        2 g 200 mL/hr over 30 Minutes Intravenous Every 8 hours 04/25/22 0936 05/02/22 1359   04/25/22 0545  vancomycin (VANCOREADY) IVPB 2000 mg/400 mL        2,000 mg 200 mL/hr over 120 Minutes Intravenous  Once 04/25/22 0540 04/25/22 0950   04/25/22 0530  ceFEPIme (MAXIPIME) 2 g in sodium chloride 0.9 % 100 mL IVPB        2 g 200 mL/hr over 30 Minutes Intravenous  Once 04/25/22 0520 04/25/22 0636   04/25/22 0530  metroNIDAZOLE (FLAGYL) IVPB 500 mg        500 mg 100 mL/hr over 60 Minutes Intravenous  Once 04/25/22 0520 04/25/22 0738   04/25/22 0530  vancomycin (VANCOCIN) IVPB 1000 mg/200 mL premix  Status:  Discontinued        1,000 mg 200 mL/hr over 60 Minutes Intravenous  Once 04/25/22 0520 04/25/22 0540      Current Facility-Administered Medications  Medication Dose Route Frequency Provider Last Rate Last Admin   acetaminophen (TYLENOL) tablet 650 mg  650 mg Oral Q6H PRN Agbata, Tochukwu, MD   650 mg at 04/26/22 2344   Or   acetaminophen (TYLENOL) suppository 650 mg   650 mg Rectal Q6H PRN Agbata, Tochukwu, MD       ceFEPIme (MAXIPIME) 2 g in sodium chloride 0.9 % 100 mL IVPB  2 g Intravenous Q8H Dallie Piles, RPH 200 mL/hr at 04/27/22 0535 2 g at 04/27/22 0535   ondansetron (ZOFRAN) tablet 4 mg  4 mg Oral Q6H PRN Agbata, Tochukwu, MD       Or   ondansetron (ZOFRAN) injection 4 mg  4 mg Intravenous Q6H PRN Agbata, Tochukwu, MD   4 mg at 04/26/22 1235   oxyCODONE-acetaminophen (PERCOCET/ROXICET) 5-325 MG per tablet 1 tablet  1 tablet Oral Q4H PRN Agbata, Tochukwu, MD       phosphorus (K PHOS NEUTRAL) tablet 250 mg  250 mg Oral TID Shawna Clamp, MD       rosuvastatin (CRESTOR) tablet 5 mg  5 mg Oral Q lunch Agbata, Tochukwu, MD   5 mg at 04/25/22 1524   senna-docusate (Senokot-S) tablet 1 tablet  1 tablet Oral BID Sharion Settler, NP   1 tablet at 04/26/22 0201   tamsulosin (FLOMAX) capsule 0.4 mg  0.4 mg Oral Q lunch Agbata, Tochukwu, MD   0.4 mg at 04/25/22 1523   vitamin B-12 (CYANOCOBALAMIN) tablet 2,500 mcg  2,500 mcg Oral Daily Agbata, Tochukwu, MD  2,500 mcg at 04/26/22 1020   warfarin (COUMADIN) tablet 3 mg  3 mg Oral ONCE-1600 Darrick Penna, [facility]       Warfarin - Pharmacist Dosing Inpatient   Does not apply q1600 Lorna Dibble, [facility]   Given at 04/26/22 1708   Objective: Vital signs in last 24 hours: Temp:  [97.6 F (36.4 C)-100.9 F (38.3 C)] 98.5 F (36.9 C) (06/12 0835) Pulse Rate:  [52-75] 64 (06/12 0835) Resp:  [12-18] 16 (06/12 0835) BP: (135-158)/(53-72) 135/57 (06/12 0835) SpO2:  [96 %-99 %] 98 % (06/12 0835)  Intake/Output from previous day: 06/11 0701 - 06/12 0700 In: 400 [P.O.:400] Out: 250 [Urine:250] Intake/Output this shift: No intake/output data recorded.  Physical Exam Vitals and nursing note reviewed.  Constitutional:      General: He is not in acute distress.    Appearance: He is not ill-appearing, toxic-appearing or diaphoretic.  HENT:     Head: Normocephalic and atraumatic.  Pulmonary:     Effort:  Pulmonary effort is normal. No respiratory distress.  Skin:    General: Skin is warm and [place].  Neurological:     Mental Status: He is alert and oriented to person, place, and time.  Psychiatric:        Mood and Affect: Mood normal.        Behavior: Behavior normal.    Lab Results:  Recent Labs    04/26/22 0427 04/27/22 0359  WBC 10.3 9.9  HGB 10.8* 10.4*  HCT 33.1* 31.9*  PLT 180 169   BMET Recent Labs    04/26/22 0427 04/27/22 0359  NA 138 139  K 3.3* 3.8  CL 109 108  CO2 24 27  GLUCOSE 115* 102*  BUN 17 16  CREATININE 0.90 1.07  CALCIUM 8.2* 8.1*   PT/INR Recent Labs    04/26/22 0427 04/27/22 0359  LABPROT 29.0* 29.1*  INR 2.8* 2.8*   Assessment & Plan: 76 year old male admitted with febrile UTI after at-home stent removal s/p ureteroscopy with Dr. Bernardo Heater.  Stent replacement was deferred.  He had some persistent hydroureteronephrosis on renal ultrasound yesterday.  Significant clinical improvement overnight.  Okay for discharge from the urologic perspective.  We will plan to repeat renal ultrasound in 1 month.  Recommend a total of 7 to 10 days of culture appropriate antibiotics.  Debroah Loop, PA-C 04/27/2022

## 2022-04-27 NOTE — Consult Note (Signed)
ANTICOAGULATION CONSULT NOTE - Consult  Pharmacy Consult for warfarin continuation Indication: h/o DVT  Allergies  Allergen Reactions   Codeine Nausea Only   Sulfa Antibiotics Nausea And Vomiting    Patient Measurements: Height: '5\' 9"'$  (175.3 cm) Weight: 92 kg (202 lb 13.2 oz) IBW/kg (Calculated) : 70.7  Vital Signs: Temp: 97.6 F (36.4 C) (06/12 0409) Temp Source: Oral (06/11 2036) BP: 135/53 (06/12 0409) Pulse Rate: 52 (06/12 0409)  Labs: Recent Labs    04/25/22 0512 04/25/22 0713 04/26/22 0427 04/27/22 0359  HGB 13.2  --  10.8* 10.4*  HCT 40.9  --  33.1* 31.9*  PLT 243  --  180 169  APTT 46*  --   --   --   LABPROT 23.2*  --  29.0* 29.1*  INR 2.1*  --  2.8* 2.8*  CREATININE 0.82  --  0.90 1.07  TROPONINIHS 11 17  --   --     Estimated Creatinine Clearance: 65.8 mL/min (by C-G formula based on SCr of 1.07 mg/dL).  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 Baseline H/H & Plts all WNL. Pharmacy consulted for continuation/mgmt of warfarin.  DDI monitoring: Pt is receiving abx (vanc/Cefepime) which can increase INR. Also received Flagyl x1 in ED.   6/10 INR 2.1 4 mg 6/11 INR 2.8 '3mg'$  6/12 INR 2.8 '3mg'$   Goal of Therapy:  INR 2-3 Monitor platelets by anticoagulation protocol: Yes   Plan:  INR stable and therapeutic. Continue home regimen and give warfarin '3mg'$  tonight.  Monitor closely for need of dose reduction ISO antibiotics. Continue to monitor INR daily with AM labs  [name] [name] 04/27/2022,7:24 AM

## 2022-04-28 LAB — BLOOD CULTURE ID PANEL (REFLEXED) - BCID2

## 2022-04-28 NOTE — Progress Notes (Signed)
PHARMACY - PHYSICIAN COMMUNICATION CRITICAL VALUE ALERT - BLOOD CULTURE IDENTIFICATION (BCID)  [name] [name] is an 76 y.o. male who presented to [facility] on 04/25/2022 with a chief complaint of chest pain and fever  Assessment:  6/10 blood cx with GNR in 1 of 4 bottles, BCID = P. Aeruginosa.  Patient with recent ureteroscopy and laser lithotripsy w/ stent exchange on 6/6.  Urine cx grew a susceptible P aeruginosa.   Name of physician (or Provider) Contacted: Dr Madaline Brilliant  Current antibiotics: received 3 days of cefepime in hospital and discharged on 7d of ciprofloxacin (Pseudomona was susceptible to both)  Changes to prescribed antibiotics recommended:  Patient is on recommended antibiotics - No changes needed - 10 days appropriate duration for source of bacteremia, consider f/u PCP in approx 1 week to f/u symptoms and need to extend antibiotics as indicated  Results for orders placed or performed during the hospital encounter of 04/25/22  Blood Culture ID Panel (Reflexed) (Collected: 04/25/2022  5:12 AM)  Result Value Ref Range   Enterococcus faecalis NOT DETECTED NOT DETECTED   Enterococcus Faecium NOT DETECTED NOT DETECTED   Listeria monocytogenes NOT DETECTED NOT DETECTED   Staphylococcus species NOT DETECTED NOT DETECTED   Staphylococcus aureus (BCID) NOT DETECTED NOT DETECTED   Staphylococcus epidermidis NOT DETECTED NOT DETECTED   Staphylococcus lugdunensis NOT DETECTED NOT DETECTED   Streptococcus species NOT DETECTED NOT DETECTED   Streptococcus agalactiae NOT DETECTED NOT DETECTED   Streptococcus pneumoniae NOT DETECTED NOT DETECTED   Streptococcus pyogenes NOT DETECTED NOT DETECTED   A.calcoaceticus-baumannii NOT DETECTED NOT DETECTED   Bacteroides fragilis NOT DETECTED NOT DETECTED   Enterobacterales NOT DETECTED NOT DETECTED   Enterobacter cloacae complex NOT DETECTED NOT DETECTED   Escherichia coli NOT DETECTED NOT DETECTED   Klebsiella aerogenes NOT DETECTED NOT DETECTED    Klebsiella oxytoca NOT DETECTED NOT DETECTED   Klebsiella pneumoniae NOT DETECTED NOT DETECTED   Proteus species NOT DETECTED NOT DETECTED   Salmonella species NOT DETECTED NOT DETECTED   Serratia marcescens NOT DETECTED NOT DETECTED   Haemophilus influenzae NOT DETECTED NOT DETECTED   Neisseria meningitidis NOT DETECTED NOT DETECTED   Pseudomonas aeruginosa DETECTED (A) NOT DETECTED   Stenotrophomonas maltophilia NOT DETECTED NOT DETECTED   Candida albicans NOT DETECTED NOT DETECTED   Candida auris NOT DETECTED NOT DETECTED   Candida glabrata NOT DETECTED NOT DETECTED   Candida krusei NOT DETECTED NOT DETECTED   Candida parapsilosis NOT DETECTED NOT DETECTED   Candida tropicalis NOT DETECTED NOT DETECTED   Cryptococcus neoformans/gattii NOT DETECTED NOT DETECTED   CTX-M ESBL NOT DETECTED NOT DETECTED   Carbapenem resistance IMP NOT DETECTED NOT DETECTED   Carbapenem resistance KPC NOT DETECTED NOT DETECTED   Carbapenem resistance NDM NOT DETECTED NOT DETECTED   Carbapenem resistance VIM NOT DETECTED NOT DETECTED   Doreene Eland, PharmD, BCPS, BCIDP Work Cell: [phone] 04/28/2022 9:12 AM

## 2022-04-30 LAB — CULTURE, BLOOD (ROUTINE X 2)
Culture: NO GROWTH
Special Requests: ADEQUATE

## 2022-05-28 ENCOUNTER — Ambulatory Visit: Payer: Medicare HMO | Admitting: Urology

## 2022-09-14 ENCOUNTER — Encounter (INDEPENDENT_AMBULATORY_CARE_PROVIDER_SITE_OTHER): Payer: Self-pay

## 2023-01-29 ENCOUNTER — Inpatient Hospital Stay
Admission: EM | Admit: 2023-01-29 | Discharge: 2023-01-31 | DRG: 322 | Disposition: A | Discharge status: Home / Self Care | Payer: Medicare HMO | Attending: Internal Medicine | Admitting: Internal Medicine

## 2023-01-29 ENCOUNTER — Other Ambulatory Visit: Payer: Self-pay

## 2023-01-29 DIAGNOSIS — Z79899 Other long term (current) drug therapy: Secondary | ICD-10-CM

## 2023-01-29 DIAGNOSIS — Z86718 Personal history of other venous thrombosis and embolism: Secondary | ICD-10-CM

## 2023-01-29 DIAGNOSIS — Z8249 Family history of ischemic heart disease and other diseases of the circulatory system: Secondary | ICD-10-CM

## 2023-01-29 DIAGNOSIS — I10 Essential (primary) hypertension: Secondary | ICD-10-CM | POA: Diagnosis present

## 2023-01-29 DIAGNOSIS — I214 Non-ST elevation (NSTEMI) myocardial infarction: Principal | ICD-10-CM | POA: Diagnosis present

## 2023-01-29 DIAGNOSIS — Z85828 Personal history of other malignant neoplasm of skin: Secondary | ICD-10-CM

## 2023-01-29 DIAGNOSIS — I200 Unstable angina: Secondary | ICD-10-CM | POA: Diagnosis present

## 2023-01-29 DIAGNOSIS — Z888 Allergy status to other drugs, medicaments and biological substances status: Secondary | ICD-10-CM

## 2023-01-29 DIAGNOSIS — Z96641 Presence of right artificial hip joint: Secondary | ICD-10-CM | POA: Diagnosis present

## 2023-01-29 DIAGNOSIS — Z881 Allergy status to other antibiotic agents status: Secondary | ICD-10-CM

## 2023-01-29 DIAGNOSIS — Z7901 Long term (current) use of anticoagulants: Secondary | ICD-10-CM

## 2023-01-29 DIAGNOSIS — Z87891 Personal history of nicotine dependence: Secondary | ICD-10-CM

## 2023-01-29 DIAGNOSIS — E785 Hyperlipidemia, unspecified: Secondary | ICD-10-CM | POA: Diagnosis present

## 2023-01-29 DIAGNOSIS — Z95828 Presence of other vascular implants and grafts: Secondary | ICD-10-CM

## 2023-01-29 DIAGNOSIS — I429 Cardiomyopathy, unspecified: Secondary | ICD-10-CM | POA: Diagnosis present

## 2023-01-29 DIAGNOSIS — I447 Left bundle-branch block, unspecified: Secondary | ICD-10-CM | POA: Diagnosis present

## 2023-01-29 DIAGNOSIS — Z8546 Personal history of malignant neoplasm of prostate: Secondary | ICD-10-CM

## 2023-01-29 DIAGNOSIS — Z9889 Other specified postprocedural states: Secondary | ICD-10-CM

## 2023-01-29 DIAGNOSIS — Z885 Allergy status to narcotic agent status: Secondary | ICD-10-CM

## 2023-01-29 DIAGNOSIS — Z87442 Personal history of urinary calculi: Secondary | ICD-10-CM

## 2023-01-29 DIAGNOSIS — K219 Gastro-esophageal reflux disease without esophagitis: Secondary | ICD-10-CM | POA: Diagnosis present

## 2023-01-29 DIAGNOSIS — Z923 Personal history of irradiation: Secondary | ICD-10-CM

## 2023-01-29 LAB — CBC WITH DIFFERENTIAL/PLATELET
Abs Immature Granulocytes: 0.02 10*3/uL (ref 0.00–0.07)
Basophils Absolute: 0.1 10*3/uL (ref 0.0–0.1)
Basophils Relative: 1 %
Eosinophils Absolute: 0.1 10*3/uL (ref 0.0–0.5)
Eosinophils Relative: 1 %
HCT: 42.3 % (ref 39.0–52.0)
Hemoglobin: 13.6 g/dL (ref 13.0–17.0)
Immature Granulocytes: 0 %
Lymphocytes Relative: 46 %
Lymphs Abs: 3.2 10*3/uL (ref 0.7–4.0)
MCH: 29.8 pg (ref 26.0–34.0)
MCHC: 32.2 g/dL (ref 30.0–36.0)
MCV: 92.8 fL (ref 80.0–100.0)
Monocytes Absolute: 0.6 10*3/uL (ref 0.1–1.0)
Monocytes Relative: 9 %
Neutro Abs: 3 10*3/uL (ref 1.7–7.7)
Neutrophils Relative %: 43 %
Platelets: 239 10*3/uL (ref 150–400)
RBC: 4.56 MIL/uL (ref 4.22–5.81)
RDW: 13.2 % (ref 11.5–15.5)
WBC: 7 10*3/uL (ref 4.0–10.5)
nRBC: 0 % (ref 0.0–0.2)

## 2023-01-29 MED ORDER — NITROGLYCERIN 0.4 MG SL SUBL
0.4000 mg | SUBLINGUAL_TABLET | SUBLINGUAL | Status: DC | PRN
  Administered 2023-01-29 – 2023-01-30 (×3): 0.4 mg via SUBLINGUAL
  Filled 2023-01-29: qty 1

## 2023-01-29 NOTE — ED Triage Notes (Signed)
Pt presents to ER from home with c/o chest pain that started around 2245 tonight.  Pt states he had been doing work outside yesterday, but today, stated he began to have some pain in his left arm this afternoon, and tonight, was watching TV when chest pain started.  Pt states pain became worse when he stood up.  Pt endorses pain radiates straight through mid sternal area to his back.  Pt denies hx of MI.  States he has hx of HTN.  Pt is A&O x4 and appears uncomfortable on arrival.

## 2023-01-29 NOTE — ED Provider Notes (Addendum)
   [facility] Provider Note    Event Date/Time   First MD Initiated Contact with Patient 01/29/23 2325     (approximate)   History   Chest Pain   HPI {Remember to add pertinent medical, surgical, social, and/or OB history to HPI:1} [name] [name] is a 77 y.o. male  ***       Physical Exam   Triage Vital Signs: ED Triage Vitals  Enc Vitals Group     BP 01/29/23 2330 (!) 165/73     Pulse Rate 01/29/23 2330 90     Resp 01/29/23 2330 18     Temp 01/29/23 2332 97.6 F (36.4 C)     Temp Source 01/29/23 2332 Oral     SpO2 01/29/23 2330 100 %     Weight 01/29/23 2328 93.9 kg (207 lb)     Height 01/29/23 2328 1.778 m (5\' 10" )     Head Circumference --      Peak Flow --      Pain Score 01/29/23 2328 5     Pain Loc --      Pain Edu? --      Excl. in [place]? --     Most recent vital signs: Vitals:   01/29/23 2330 01/29/23 2332  BP: (!) 165/73   Pulse: 90   Resp: 18   Temp:  97.6 F (36.4 C)  SpO2: 100%     {Only need to document appropriate and relevant physical exam:1} General: Awake, no distress. *** CV:  Good peripheral perfusion. *** Resp:  Normal effort. Speaking easily and comfortably, no accessory muscle usage nor intercostal retractions.  *** Abd:  No distention. *** Other:  ***   ED Results / Procedures / Treatments   Labs (all labs ordered are listed, but only abnormal results are displayed) Labs Reviewed  CBC WITH DIFFERENTIAL/PLATELET  COMPREHENSIVE METABOLIC PANEL  PROTIME-INR  TROPONIN I (HIGH SENSITIVITY)     EKG  ***   RADIOLOGY *** {USE THE WORD "INTERPRETED"!! You MUST document your own interpretation of imaging, as well as the fact that you reviewed the radiologist's report!:1}   PROCEDURES:  Critical Care performed: {CriticalCareYesNo:19197::"Yes, see critical care procedure note(s)","No"}  Procedures   MEDICATIONS ORDERED IN ED: Medications - No data to display   IMPRESSION / MDM / [place] / ED COURSE  I reviewed the triage vital signs and the nursing notes.                              Differential diagnosis includes, but is not limited to, ***  Patient's presentation is most consistent with {EM COPA:27473}  Labs/studies ordered:  *** Interventions/Medications given:  *** [facility] Course my include additional interventions or labs/studies not listed above.)  ***  {**The patient is on the cardiac monitor to evaluate for evidence of arrhythmia and/or significant heart rate changes.**}       FINAL CLINICAL IMPRESSION(S) / ED DIAGNOSES   Final diagnoses:  None     Rx / DC Orders   ED Discharge Orders     None        Note:  This document was prepared using Dragon voice recognition software and may include unintentional dictation errors.

## 2023-01-30 ENCOUNTER — Encounter
Admission: EM | Disposition: A | Discharge status: Home / Self Care | Payer: Self-pay | Source: Home / Self Care | Attending: Internal Medicine

## 2023-01-30 ENCOUNTER — Inpatient Hospital Stay
Admit: 2023-01-30 | Discharge: 2023-01-30 | Disposition: A | Discharge status: Home / Self Care | Payer: Medicare HMO | Attending: Internal Medicine | Admitting: Internal Medicine

## 2023-01-30 ENCOUNTER — Emergency Department: Payer: Medicare HMO

## 2023-01-30 DIAGNOSIS — Z881 Allergy status to other antibiotic agents status: Secondary | ICD-10-CM | POA: Diagnosis not present

## 2023-01-30 DIAGNOSIS — I200 Unstable angina: Secondary | ICD-10-CM | POA: Insufficient documentation

## 2023-01-30 DIAGNOSIS — Z86718 Personal history of other venous thrombosis and embolism: Secondary | ICD-10-CM | POA: Diagnosis not present

## 2023-01-30 DIAGNOSIS — I25110 Atherosclerotic heart disease of native coronary artery with unstable angina pectoris: Secondary | ICD-10-CM | POA: Diagnosis not present

## 2023-01-30 DIAGNOSIS — Z87442 Personal history of urinary calculi: Secondary | ICD-10-CM | POA: Diagnosis not present

## 2023-01-30 DIAGNOSIS — I214 Non-ST elevation (NSTEMI) myocardial infarction: Principal | ICD-10-CM | POA: Diagnosis present

## 2023-01-30 DIAGNOSIS — Z923 Personal history of irradiation: Secondary | ICD-10-CM | POA: Diagnosis not present

## 2023-01-30 DIAGNOSIS — K219 Gastro-esophageal reflux disease without esophagitis: Secondary | ICD-10-CM | POA: Diagnosis present

## 2023-01-30 DIAGNOSIS — Z87891 Personal history of nicotine dependence: Secondary | ICD-10-CM | POA: Diagnosis not present

## 2023-01-30 DIAGNOSIS — E785 Hyperlipidemia, unspecified: Secondary | ICD-10-CM

## 2023-01-30 DIAGNOSIS — Z888 Allergy status to other drugs, medicaments and biological substances status: Secondary | ICD-10-CM | POA: Diagnosis not present

## 2023-01-30 DIAGNOSIS — Z96641 Presence of right artificial hip joint: Secondary | ICD-10-CM | POA: Diagnosis present

## 2023-01-30 DIAGNOSIS — I429 Cardiomyopathy, unspecified: Secondary | ICD-10-CM | POA: Diagnosis present

## 2023-01-30 DIAGNOSIS — Z9889 Other specified postprocedural states: Secondary | ICD-10-CM

## 2023-01-30 DIAGNOSIS — I10 Essential (primary) hypertension: Secondary | ICD-10-CM | POA: Diagnosis present

## 2023-01-30 DIAGNOSIS — Z885 Allergy status to narcotic agent status: Secondary | ICD-10-CM | POA: Diagnosis not present

## 2023-01-30 DIAGNOSIS — Z79899 Other long term (current) drug therapy: Secondary | ICD-10-CM | POA: Diagnosis not present

## 2023-01-30 DIAGNOSIS — Z7901 Long term (current) use of anticoagulants: Secondary | ICD-10-CM | POA: Diagnosis not present

## 2023-01-30 DIAGNOSIS — Z8546 Personal history of malignant neoplasm of prostate: Secondary | ICD-10-CM | POA: Diagnosis not present

## 2023-01-30 DIAGNOSIS — Z8249 Family history of ischemic heart disease and other diseases of the circulatory system: Secondary | ICD-10-CM | POA: Diagnosis not present

## 2023-01-30 DIAGNOSIS — Z85828 Personal history of other malignant neoplasm of skin: Secondary | ICD-10-CM | POA: Diagnosis not present

## 2023-01-30 DIAGNOSIS — Z95828 Presence of other vascular implants and grafts: Secondary | ICD-10-CM | POA: Diagnosis not present

## 2023-01-30 DIAGNOSIS — I447 Left bundle-branch block, unspecified: Secondary | ICD-10-CM | POA: Diagnosis present

## 2023-01-30 HISTORY — PX: LEFT HEART CATH AND CORONARY ANGIOGRAPHY: CATH118249

## 2023-01-30 HISTORY — PX: CORONARY/GRAFT ACUTE MI REVASCULARIZATION: CATH118305

## 2023-01-30 LAB — ECHOCARDIOGRAM COMPLETE
AR max vel: 2.77 cm2
AV Area VTI: 2.85 cm2
AV Area mean vel: 2.64 cm2
AV Mean grad: 3 mmHg
AV Peak grad: 5.4 mmHg
Ao pk vel: 1.16 m/s
Calc EF: 41.2 %
Height: 70 in
MV M vel: 4.87 m/s
MV Peak grad: 94.9 mmHg
MV VTI: 2.62 cm2
Radius: 0.4 cm
S' Lateral: 3.5 cm
Single Plane A2C EF: 38 %
Single Plane A4C EF: 42.7 %
Weight: 3312 oz

## 2023-01-30 LAB — LIPID PANEL
Cholesterol: 192 mg/dL (ref 0–200)
HDL: 44 mg/dL (ref >40)
LDL Cholesterol: 104 mg/dL — ABNORMAL HIGH (ref 0–99)
Total CHOL/HDL Ratio: 4.4 RATIO
Triglycerides: 218 mg/dL — ABNORMAL HIGH (ref <150)
VLDL: 44 mg/dL — ABNORMAL HIGH (ref 0–40)

## 2023-01-30 LAB — COMPREHENSIVE METABOLIC PANEL
ALT: 14 U/L (ref 0–44)
AST: 23 U/L (ref 15–41)
Albumin: 4.3 g/dL (ref 3.5–5.0)
Alkaline Phosphatase: 79 U/L (ref 38–126)
Anion gap: 11 (ref 5–15)
BUN: 23 mg/dL (ref 8–23)
CO2: 25 mmol/L (ref 22–32)
Calcium: 9.4 mg/dL (ref 8.9–10.3)
Chloride: 103 mmol/L (ref 98–111)
Creatinine, Ser: 1.01 mg/dL (ref 0.61–1.24)
GFR, Estimated: >60 mL/min (ref >60)
Glucose, Bld: 105 mg/dL — ABNORMAL HIGH (ref 70–99)
Potassium: 3.4 mmol/L — ABNORMAL LOW (ref 3.5–5.1)
Sodium: 139 mmol/L (ref 135–145)
Total Bilirubin: 0.6 mg/dL (ref 0.3–1.2)
Total Protein: 7.5 g/dL (ref 6.5–8.1)

## 2023-01-30 LAB — TROPONIN I (HIGH SENSITIVITY)
Troponin I (High Sensitivity): 312 ng/L (ref <18)
Troponin I (High Sensitivity): 397 ng/L (ref <18)

## 2023-01-30 LAB — BASIC METABOLIC PANEL
Anion gap: 8 (ref 5–15)
BUN: 21 mg/dL (ref 8–23)
CO2: 24 mmol/L (ref 22–32)
Calcium: 8.6 mg/dL — ABNORMAL LOW (ref 8.9–10.3)
Chloride: 105 mmol/L (ref 98–111)
Creatinine, Ser: 0.97 mg/dL (ref 0.61–1.24)
GFR, Estimated: >60 mL/min (ref >60)
Glucose, Bld: 126 mg/dL — ABNORMAL HIGH (ref 70–99)
Potassium: 3.7 mmol/L (ref 3.5–5.1)
Sodium: 137 mmol/L (ref 135–145)

## 2023-01-30 LAB — APTT: aPTT: 44 seconds — ABNORMAL HIGH (ref 24–36)

## 2023-01-30 LAB — PROTIME-INR
INR: 1.9 — ABNORMAL HIGH (ref 0.8–1.2)
INR: 2.2 — ABNORMAL HIGH (ref 0.8–1.2)
Prothrombin Time: 21.9 seconds — ABNORMAL HIGH (ref 11.4–15.2)
Prothrombin Time: 24.1 seconds — ABNORMAL HIGH (ref 11.4–15.2)

## 2023-01-30 LAB — POCT ACTIVATED CLOTTING TIME
Activated Clotting Time: 255 seconds
Activated Clotting Time: 325 seconds

## 2023-01-30 LAB — CBC
HCT: 38.4 % — ABNORMAL LOW (ref 39.0–52.0)
Hemoglobin: 12.6 g/dL — ABNORMAL LOW (ref 13.0–17.0)
MCH: 29.9 pg (ref 26.0–34.0)
MCHC: 32.8 g/dL (ref 30.0–36.0)
MCV: 91.2 fL (ref 80.0–100.0)
Platelets: 214 10*3/uL (ref 150–400)
RBC: 4.21 MIL/uL — ABNORMAL LOW (ref 4.22–5.81)
RDW: 13.1 % (ref 11.5–15.5)
WBC: 10.4 10*3/uL (ref 4.0–10.5)
nRBC: 0 % (ref 0.0–0.2)

## 2023-01-30 LAB — LIPASE, BLOOD: Lipase: 45 U/L (ref 11–51)

## 2023-01-30 LAB — GLUCOSE, CAPILLARY: Glucose-Capillary: 136 mg/dL — ABNORMAL HIGH (ref 70–99)

## 2023-01-30 SURGERY — CORONARY/GRAFT ACUTE MI REVASCULARIZATION

## 2023-01-30 MED ORDER — SODIUM CHLORIDE 0.9% FLUSH
3.0000 mL | Freq: Two times a day (BID) | INTRAVENOUS | Status: DC
  Administered 2023-01-30 – 2023-01-31 (×3): 3 mL via INTRAVENOUS

## 2023-01-30 MED ORDER — FENTANYL CITRATE (PF) 100 MCG/2ML IJ SOLN
INTRAMUSCULAR | Status: AC
  Filled 2023-01-30: qty 2

## 2023-01-30 MED ORDER — ACETAMINOPHEN 325 MG PO TABS: 650.0000 mg | ORAL_TABLET | ORAL | Status: DC | PRN

## 2023-01-30 MED ORDER — ATORVASTATIN CALCIUM 20 MG PO TABS: 80.0000 mg | ORAL_TABLET | Freq: Every day | ORAL | Status: DC

## 2023-01-30 MED ORDER — HEPARIN (PORCINE) IN NACL 1000-0.9 UT/500ML-% IV SOLN
INTRAVENOUS | Status: AC
  Filled 2023-01-30: qty 1000

## 2023-01-30 MED ORDER — ROSUVASTATIN CALCIUM 10 MG PO TABS: 40.0000 mg | ORAL_TABLET | Freq: Every day | ORAL | Status: DC

## 2023-01-30 MED ORDER — FUROSEMIDE 20 MG PO TABS
20.0000 mg | ORAL_TABLET | Freq: Every day | ORAL | Status: DC
  Administered 2023-01-30 – 2023-01-31 (×2): 20 mg via ORAL
  Filled 2023-01-30 (×3): qty 1

## 2023-01-30 MED ORDER — MIDAZOLAM HCL 2 MG/2ML IJ SOLN
INTRAMUSCULAR | Status: AC
  Filled 2023-01-30: qty 2

## 2023-01-30 MED ORDER — ASPIRIN 81 MG PO CHEW
324.0000 mg | CHEWABLE_TABLET | Freq: Once | ORAL | Status: AC
  Administered 2023-01-30: 324 mg via ORAL
  Filled 2023-01-30: qty 4

## 2023-01-30 MED ORDER — CLOPIDOGREL BISULFATE 75 MG PO TABS
75.0000 mg | ORAL_TABLET | Freq: Every day | ORAL | Status: DC
  Administered 2023-01-31: 75 mg via ORAL
  Filled 2023-01-30: qty 1

## 2023-01-30 MED ORDER — CHLORHEXIDINE GLUCONATE CLOTH 2 % EX PADS
6.0000 | MEDICATED_PAD | Freq: Every day | CUTANEOUS | Status: DC
  Administered 2023-01-30 – 2023-01-31 (×2): 6 via TOPICAL

## 2023-01-30 MED ORDER — CLOPIDOGREL BISULFATE 75 MG PO TABS
600.0000 mg | ORAL_TABLET | Freq: Once | ORAL | Status: AC
  Administered 2023-01-30: 600 mg via ORAL
  Filled 2023-01-30: qty 8

## 2023-01-30 MED ORDER — LABETALOL HCL 5 MG/ML IV SOLN: 10.0000 mg | INTRAVENOUS | Status: AC | PRN

## 2023-01-30 MED ORDER — CARVEDILOL 6.25 MG PO TABS
6.2500 mg | ORAL_TABLET | Freq: Two times a day (BID) | ORAL | Status: DC
  Administered 2023-01-30 – 2023-01-31 (×3): 6.25 mg via ORAL
  Filled 2023-01-30 (×3): qty 1

## 2023-01-30 MED ORDER — SODIUM CHLORIDE 0.9 % IV SOLN: 250.0000 mL | INTRAVENOUS | Status: DC | PRN

## 2023-01-30 MED ORDER — WARFARIN SODIUM 3 MG PO TABS: 3.0000 mg | ORAL_TABLET | ORAL | Status: DC

## 2023-01-30 MED ORDER — SPIRONOLACTONE 12.5 MG HALF TABLET
12.5000 mg | ORAL_TABLET | Freq: Every day | ORAL | Status: DC
  Administered 2023-01-30 – 2023-01-31 (×2): 12.5 mg via ORAL
  Filled 2023-01-30 (×2): qty 1

## 2023-01-30 MED ORDER — ASPIRIN 81 MG PO CHEW
81.0000 mg | CHEWABLE_TABLET | Freq: Every day | ORAL | Status: DC
  Administered 2023-01-31: 81 mg via ORAL
  Filled 2023-01-30: qty 1

## 2023-01-30 MED ORDER — MORPHINE SULFATE (PF) 4 MG/ML IV SOLN
4.0000 mg | Freq: Once | INTRAVENOUS | Status: AC
  Administered 2023-01-30: 4 mg via INTRAVENOUS
  Filled 2023-01-30: qty 1

## 2023-01-30 MED ORDER — IOHEXOL 350 MG/ML SOLN
100.0000 mL | Freq: Once | INTRAVENOUS | Status: AC | PRN
  Administered 2023-01-30: 100 mL via INTRAVENOUS

## 2023-01-30 MED ORDER — LISINOPRIL 5 MG PO TABS
2.5000 mg | ORAL_TABLET | Freq: Every day | ORAL | Status: DC
  Administered 2023-01-30 – 2023-01-31 (×2): 2.5 mg via ORAL
  Filled 2023-01-30 (×2): qty 1

## 2023-01-30 MED ORDER — VERAPAMIL HCL 2.5 MG/ML IV SOLN
INTRAVENOUS | Status: DC | PRN
  Administered 2023-01-30: 2.5 mg via INTRA_ARTERIAL

## 2023-01-30 MED ORDER — TICAGRELOR 90 MG PO TABS
ORAL_TABLET | ORAL | Status: AC
  Filled 2023-01-30: qty 2

## 2023-01-30 MED ORDER — MIDAZOLAM HCL 2 MG/2ML IJ SOLN
INTRAMUSCULAR | Status: DC | PRN
  Administered 2023-01-30: 1 mg via INTRAVENOUS

## 2023-01-30 MED ORDER — ONDANSETRON HCL 4 MG/2ML IJ SOLN
4.0000 mg | Freq: Four times a day (QID) | INTRAMUSCULAR | Status: DC | PRN
  Administered 2023-01-30 (×2): 4 mg via INTRAVENOUS
  Filled 2023-01-30 (×2): qty 2

## 2023-01-30 MED ORDER — PERFLUTREN LIPID MICROSPHERE
1.0000 mL | INTRAVENOUS | Status: AC | PRN
  Administered 2023-01-30: 3 mL via INTRAVENOUS

## 2023-01-30 MED ORDER — TICAGRELOR 60 MG PO TABS
ORAL_TABLET | ORAL | Status: DC | PRN
  Administered 2023-01-30: 180 mg via ORAL

## 2023-01-30 MED ORDER — WARFARIN SODIUM 4 MG PO TABS
4.0000 mg | ORAL_TABLET | ORAL | Status: DC
  Administered 2023-01-30: 4 mg via ORAL
  Filled 2023-01-30 (×2): qty 1

## 2023-01-30 MED ORDER — HEPARIN SODIUM (PORCINE) 1000 UNIT/ML IJ SOLN
INTRAMUSCULAR | Status: AC
  Filled 2023-01-30: qty 10

## 2023-01-30 MED ORDER — ONDANSETRON HCL 4 MG/2ML IJ SOLN
4.0000 mg | INTRAMUSCULAR | Status: AC
  Administered 2023-01-30: 4 mg via INTRAVENOUS
  Filled 2023-01-30: qty 2

## 2023-01-30 MED ORDER — SODIUM CHLORIDE 0.9% FLUSH: 3.0000 mL | INTRAVENOUS | Status: DC | PRN

## 2023-01-30 MED ORDER — POTASSIUM CHLORIDE CRYS ER 20 MEQ PO TBCR
20.0000 meq | EXTENDED_RELEASE_TABLET | Freq: Once | ORAL | Status: AC
  Administered 2023-01-30: 20 meq via ORAL
  Filled 2023-01-30: qty 1

## 2023-01-30 MED ORDER — WARFARIN SODIUM 4 MG PO TABS: 4.0000 mg | ORAL_TABLET | ORAL | Status: DC

## 2023-01-30 MED ORDER — HEPARIN (PORCINE) IN NACL 1000-0.9 UT/500ML-% IV SOLN
INTRAVENOUS | Status: DC | PRN
  Administered 2023-01-30 (×2): 500 mL

## 2023-01-30 MED ORDER — FENTANYL CITRATE (PF) 100 MCG/2ML IJ SOLN
INTRAMUSCULAR | Status: DC | PRN
  Administered 2023-01-30: 25 ug via INTRAVENOUS

## 2023-01-30 MED ORDER — FUROSEMIDE 10 MG/ML IJ SOLN
INTRAMUSCULAR | Status: AC
  Filled 2023-01-30: qty 4

## 2023-01-30 MED ORDER — HYDRALAZINE HCL 20 MG/ML IJ SOLN: 10.0000 mg | INTRAMUSCULAR | Status: AC | PRN

## 2023-01-30 MED ORDER — WARFARIN - PHYSICIAN DOSING INPATIENT: Freq: Every day | Status: DC

## 2023-01-30 MED ORDER — POTASSIUM CHLORIDE 10 MEQ/100ML IV SOLN
10.0000 meq | INTRAVENOUS | Status: DC
  Administered 2023-01-30 (×2): 10 meq via INTRAVENOUS
  Filled 2023-01-30 (×4): qty 100

## 2023-01-30 MED ORDER — HEPARIN SODIUM (PORCINE) 1000 UNIT/ML IJ SOLN
INTRAMUSCULAR | Status: DC | PRN
  Administered 2023-01-30: 6000 [IU] via INTRAVENOUS
  Administered 2023-01-30: 1000 [IU] via INTRAVENOUS

## 2023-01-30 MED ORDER — MAGNESIUM SULFATE 2 GM/50ML IV SOLN
2.0000 g | Freq: Once | INTRAVENOUS | Status: AC
  Administered 2023-01-30: 2 g via INTRAVENOUS
  Filled 2023-01-30: qty 50

## 2023-01-30 MED ORDER — ATORVASTATIN CALCIUM 20 MG PO TABS
80.0000 mg | ORAL_TABLET | Freq: Every day | ORAL | Status: DC
  Administered 2023-01-30 – 2023-01-31 (×2): 80 mg via ORAL
  Filled 2023-01-30 (×2): qty 4

## 2023-01-30 MED ORDER — FUROSEMIDE 10 MG/ML IJ SOLN
INTRAMUSCULAR | Status: DC | PRN
  Administered 2023-01-30: 10 mg via INTRAVENOUS

## 2023-01-30 MED ORDER — VERAPAMIL HCL 2.5 MG/ML IV SOLN
INTRAVENOUS | Status: AC
  Filled 2023-01-30: qty 2

## 2023-01-30 MED ORDER — IOHEXOL 350 MG/ML SOLN
INTRAVENOUS | Status: DC | PRN
  Administered 2023-01-30: 126 mL

## 2023-01-30 MED ORDER — SODIUM CHLORIDE 0.9 % IV SOLN: INTRAVENOUS | Status: DC

## 2023-01-30 MED ORDER — HEPARIN SODIUM (PORCINE) 5000 UNIT/ML IJ SOLN
4000.0000 [IU] | Freq: Once | INTRAMUSCULAR | Status: AC
  Administered 2023-01-30: 4000 [IU] via INTRAVENOUS
  Filled 2023-01-30: qty 1

## 2023-01-30 SURGICAL SUPPLY — 26 items
BALLN TREK RX 2.5X12 (BALLOONS) ×1
BALLN ~~LOC~~ TREK NEO RX 3.0X12 (BALLOONS) ×1
BALLOON TREK RX 2.5X12 (BALLOONS) IMPLANT
BALLOON ~~LOC~~ TREK NEO RX 3.0X12 (BALLOONS) IMPLANT
CANNULA 5F STIFF (CANNULA) IMPLANT
CATH 5F 110X4 TIG (CATHETERS) IMPLANT
CATH INFINITI 5FR ANG PIGTAIL (CATHETERS) IMPLANT
CATH INFINITI JR4 5F (CATHETERS) IMPLANT
CATH LAUNCHER 5F EBU3.5 (CATHETERS) IMPLANT
CATH LAUNCHER 5F JR4 (CATHETERS) IMPLANT
CATH LAUNCHER 6FR EBU3.5 (CATHETERS) IMPLANT
DEVICE RAD TR BAND REGULAR (VASCULAR PRODUCTS) IMPLANT
DRAPE BRACHIAL (DRAPES) IMPLANT
GLIDESHEATH SLEND SS 6F .021 (SHEATH) IMPLANT
GUIDEWIRE INQWIRE 1.5J.035X260 (WIRE) IMPLANT
INQWIRE 1.5J .035X260CM (WIRE) ×1
KIT ENCORE 26 ADVANTAGE (KITS) IMPLANT
PACK CARDIAC CATH (CUSTOM PROCEDURE TRAY) IMPLANT
PROTECTION STATION PRESSURIZED (MISCELLANEOUS) ×1
SET ATX-X65L (MISCELLANEOUS) IMPLANT
STATION PROTECTION PRESSURIZED (MISCELLANEOUS) IMPLANT
STENT ONYX FRONTIER 3.0X12 (Permanent Stent) IMPLANT
TUBING CIL FLEX 10 FLL-RA (TUBING) IMPLANT
WIRE AMPLATZ SS-J .035X180CM (WIRE) IMPLANT
WIRE ASAHI PROWATER 180CM (WIRE) IMPLANT
WIRE HITORQ VERSACORE ST 145CM (WIRE) IMPLANT

## 2023-01-30 NOTE — Progress Notes (Signed)
  Chaplain On-Call responded to a page from Kimberly, concerning the Code STEMI notification at 0130 hours.  The patient was taken from ED room 18 to the Cath Lab. Chaplain met patient's wife [name] in the Cath Lab Waiting Room, and provided spiritual and emotional support for her during the time of the procedure.  The patient was admitted to the ICU room 12. Chaplain met the patient and offered support and encouragement, with [name] at bedside.  Chaplain Pollyann Samples M.Div., [facility]

## 2023-01-30 NOTE — H&P (Signed)
History and Physical    Patient: [name] [name] DOB: Jan 13, 1946 DOA: 01/29/2023 DOS: the patient was seen and examined on 01/30/2023 PCP: Adin Hector, MD  Patient coming from: Home  Chief Complaint:  Chief Complaint  Patient presents with   Chest Pain   HPI: [name] [name] is a 77 y.o. 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   He was rendered chest pain-free following intervention.  He denies complaints.   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   Prostate cancer ([place]) 2018   Treated with radiation   Rectal bleeding    2013, 2020   Past Surgical History:  Procedure Laterality Date   COLONOSCOPY N/A 07/02/2019   Procedure: COLONOSCOPY;  Surgeon: Lin Landsman, MD;  Location: [facility] ENDOSCOPY;  Service: Gastroenterology;  Laterality: N/A;   COLONOSCOPY     2001, 2011   CYSTOSCOPY W/ RETROGRADES Right 04/06/2022   Procedure: CYSTOSCOPY WITH RETROGRADE PYELOGRAM;  Surgeon: Hollice Espy, MD;  Location: [facility] ORS;  Service: Urology;  Laterality: Right;   CYSTOSCOPY/URETEROSCOPY/HOLMIUM LASER/STENT PLACEMENT Right 04/06/2022   Procedure: CYSTOSCOPY/URETEROSCOPY/HOLMIUM LASER/STENT PLACEMENT;  Surgeon: Hollice Espy, MD;  Location: [facility] ORS;  Service: Urology;  Laterality: Right;   CYSTOSCOPY/URETEROSCOPY/HOLMIUM  LASER/STENT PLACEMENT Right 04/21/2022   Procedure: CYSTOSCOPY/URETEROSCOPY/HOLMIUM LASER/STENT EXCHANGE;  Surgeon: Abbie Sons, MD;  Location: [facility] ORS;  Service: Urology;  Laterality: Right;   ESOPHAGOGASTRODUODENOSCOPY N/A 07/02/2019   Procedure: ESOPHAGOGASTRODUODENOSCOPY (EGD);  Surgeon: Lin Landsman, MD;  Location: [facility] ENDOSCOPY;  Service: Gastroenterology;  Laterality: N/A;   ESOPHAGOGASTRODUODENOSCOPY  2013   FOOT FRACTURE SURGERY Right 2012   Heel fracture repair/ has metal, fell off ladder   HERNIA REPAIR Right 2005   inguinal   IVC FILTER INSERTION N/A 09/23/2020   Procedure: IVC FILTER INSERTION;  Surgeon: Algernon Huxley, MD;  Location: [place] CV LAB;  Service: Cardiovascular;  Laterality: N/A;   IVC FILTER REMOVAL N/A 11/13/2020   Procedure: IVC FILTER REMOVAL;  Surgeon: Algernon Huxley, MD;  Location: [place] CV LAB;  Service: Cardiovascular;  Laterality: N/A;   TONSILLECTOMY     as a child   TOTAL HIP ARTHROPLASTY Right 10/21/2020   Procedure: TOTAL HIP ARTHROPLASTY;  Surgeon: Dereck Leep, MD;  Location: [facility] ORS;  Service: Orthopedics;  Laterality: Right;   XI ROBOTIC ASSISTED INGUINAL HERNIA REPAIR WITH MESH Right 07/18/2021   Procedure: XI ROBOTIC ASSISTED INGUINAL HERNIA REPAIR WITH MESH;  Surgeon: Benjamine Sprague, DO;  Location: [facility] ORS;  Service: General;  Laterality: Right;   Social History:  reports that he quit smoking about 42 years ago. His smoking use included cigarettes. He has never used smokeless tobacco. He reports that he does not currently use alcohol. He reports that he does not use drugs.  Allergies  Allergen Reactions   Rosuvastatin Other (See Comments)   Codeine Nausea Only   Sulfa Antibiotics Nausea And Vomiting    Family History  Problem Relation Age of Onset   Heart attack Father     Prior to Admission medications   Medication Sig Start Date End Date Taking? Authorizing Provider  acetaminophen (TYLENOL) 500 MG tablet Take  1,000 mg by mouth at bedtime as needed.   Yes [provider]  Cyanocobalamin 2500 MCG SUBL Take 2,500 mcg by mouth daily. Takes at 11 am   Yes [provider]  Glucosamine-Chondroitin (OSTEO BI-FLEX REGULAR STRENGTH PO) Take 2 tablets by mouth daily. Takes at 11 am   Yes [provider]  ipratropium (ATROVENT) 0.03 % nasal spray Place 1 spray into the nose daily. 01/14/23 01/14/24 Yes [provider]  pravastatin (PRAVACHOL) 10 MG tablet Take 10 mg by mouth daily.   Yes [provider]  tamsulosin (FLOMAX) 0.4 MG CAPS capsule Take 0.4 mg by mouth daily with lunch. 04/14/22  Yes [provider]  triamterene-hydrochlorothiazide (MAXZIDE-25) 37.5-25 MG tablet Take 0.5 tablets by mouth daily with lunch. Takes at 11 am 06/02/17  Yes [provider]  warfarin (COUMADIN) 3 MG tablet Take 3 mg by mouth as directed. Takes on Mondays, Tuesday, Wednesdays, Thursday and Fridays 01/13/22 01/30/23 Yes [provider]  warfarin (COUMADIN) 4 MG tablet Take 4 mg by mouth as directed. Takes on Sat and Sun. 06/19/19 01/30/23 Yes [provider]  ondansetron (ZOFRAN-ODT) 4 MG disintegrating tablet Take 1 tablet (4 mg total) by mouth every 8 (eight) hours as needed for nausea or vomiting. Patient not taking: Reported on 01/30/2023 04/05/22   Rudene Re, MD  oxyCODONE-acetaminophen (PERCOCET) 5-325 MG tablet Take 1 tablet by mouth every 4 (four) hours as needed. Patient not taking: Reported on 01/30/2023 04/05/22   Rudene Re, MD  phosphorus (K PHOS NEUTRAL) [phone] MG tablet Take 1 tablet (250 mg total) by mouth 3 (three) times daily. Patient not taking: Reported on 01/30/2023 04/27/22   Duard Brady, MD  rosuvastatin (CRESTOR) 5 MG tablet Take 5 mg by mouth daily with lunch. Patient not taking: Reported on 01/30/2023 03/24/22   [provider]    Physical Exam: Vitals:   01/30/23 0300 01/30/23 0305 01/30/23 0310 01/30/23  0400  BP: 128/69 121/68 127/70 119/63  Pulse: 82 82 89 83  Resp: 18 16 (!) 22 18  Temp:    97.9 F (36.6 C)  TempSrc:    Oral  SpO2: 97% 97% 97% 97%  Weight:      Height:       Physical Exam Vitals and nursing note reviewed.  Constitutional:      General: He is not in acute distress. HENT:     Head: Normocephalic and atraumatic.  Cardiovascular:     Rate and Rhythm: Normal rate and regular rhythm.     Heart sounds: Normal heart sounds.  Pulmonary:     Effort: Pulmonary effort is normal.     Breath sounds: Normal breath sounds.  Abdominal:     Palpations: Abdomen is soft.     Tenderness: There is no abdominal tenderness.  Neurological:     Mental Status: Mental status is at baseline.     Data Reviewed: Relevant notes from primary care and specialist visits, past discharge summaries as available in EHR, including Care Everywhere. Prior diagnostic testing as pertinent to current admission diagnoses Updated medications and problem lists for reconciliation ED course,  including vitals, labs, imaging, treatment and response to treatment Triage notes, nursing and pharmacy notes and ED provider's notes Notable results as noted in HPI   Assessment and Plan: * NSTEMI (non-ST elevated myocardial infarction) [facility]) S/p cardiac cath DES to LAD Orders per cardiology Cardiology consulted to follow  Hypertension Continue carvedilol.  Hold Dyazide  History of DVT (deep vein thrombosis) Continue Coumadin.  Pharmacy consulted to manage  History of prostate cancer No acute issues suspected      Advance Care Planning:   Code Status: Full Code   Consults: [facility] cardiology, Dr. Clayborn Bigness  Family Communication: wife at bedside  Severity of Illness: The appropriate patient status for this patient is INPATIENT. Inpatient status is judged to be reasonable and necessary in order to provide the required intensity of service to ensure the patient's safety. The patient's presenting  symptoms, physical exam findings, and initial radiographic and laboratory data in the context of their chronic comorbidities is felt to place them at high risk for further clinical deterioration. Furthermore, it is not anticipated that the patient will be medically stable for discharge from the hospital within 2 midnights of admission.   * I certify that at the point of admission it is my clinical judgment that the patient will require inpatient hospital care spanning beyond 2 midnights from the point of admission due to high intensity of service, high risk for further deterioration and high frequency of surveillance required.*  Author: Athena Masse, MD 01/30/2023 5:10 AM  For on call review [email or web address].

## 2023-01-30 NOTE — Progress Notes (Signed)
eLink Physician-Brief Progress Note Patient Name: [name] [name] DOB: Sep 01, 1946 MRN: [id number]   Date of Service  01/30/2023  HPI/Events of Note  Patient admitted with a STEMI and underwent cardiac catheterization with PCI + Stent placement , she is on DAPT.  eICU Interventions  New Patient Evaluation        Frederik Pear 01/30/2023, 4:06 AM

## 2023-01-30 NOTE — Progress Notes (Incomplete)
Rounding Note    Patient Name: [name] [name] Date of Encounter: 01/30/2023  [place] Cardiologist: New  Subjective   ***  Inpatient Medications    Scheduled Meds:  [START ON 01/31/2023] aspirin  81 mg Oral Daily   atorvastatin  80 mg Oral Daily   carvedilol  6.25 mg Oral BID WC   clopidogrel  600 mg Oral Once   [START ON 01/31/2023] clopidogrel  75 mg Oral Q breakfast   furosemide  20 mg Oral Daily   sodium chloride flush  3 mL Intravenous Q12H   [START ON 02/01/2023] warfarin  3 mg Oral Q MTWThF   warfarin  4 mg Oral Once per day on Sun Sat   Warfarin - Physician Dosing Inpatient   Does not apply q1600   Continuous Infusions:  sodium chloride     sodium chloride     magnesium sulfate bolus IVPB 2 g (01/30/23 1008)   potassium chloride 10 mEq (01/30/23 1046)   PRN Meds: sodium chloride, acetaminophen, nitroGLYCERIN, ondansetron (ZOFRAN) IV, sodium chloride flush   Vital Signs    Vitals:   01/30/23 0300 01/30/23 0305 01/30/23 0310 01/30/23 0400  BP: 128/69 121/68 127/70 119/63  Pulse: 82 82 89 83  Resp: 18 16 (!) 22 18  Temp:    97.9 F (36.6 C)  TempSrc:    Oral  SpO2: 97% 97% 97% 97%  Weight:      Height:        Intake/Output Summary (Last 24 hours) at 01/30/2023 1051 Last data filed at 01/30/2023 1038 Gross per 24 hour  Intake 340 ml  Output 600 ml  Net -260 ml      01/29/2023   11:28 PM 04/25/2022    4:55 AM 04/21/2022   10:07 AM  Last 3 Weights  Weight (lbs) 207 lb 202 lb 13.2 oz 205 lb  Weight (kg) 93.895 kg 92 kg 92.987 kg      Telemetry    *** - Personally Reviewed  ECG    *** - Personally Reviewed  Physical Exam  *** GEN: No acute distress.   Neck: No JVD Cardiac: RRR, no murmurs, rubs, or gallops.  Respiratory: Clear to auscultation bilaterally. GI: Soft, nontender, non-distended  MS: No edema; No deformity. Neuro:  Nonfocal  Psych: Normal affect   Labs    High Sensitivity Troponin:   Recent Labs  Lab  01/29/23 2347 01/30/23 0124  TROPONINIHS 312* 397*     Chemistry Recent Labs  Lab 01/29/23 2347 01/30/23 0441  NA 139 137  K 3.4* 3.7  CL 103 105  CO2 25 24  GLUCOSE 105* 126*  BUN 23 21  CREATININE 1.01 0.97  CALCIUM 9.4 8.6*  PROT 7.5  --   ALBUMIN 4.3  --   AST 23  --   ALT 14  --   ALKPHOS 79  --   BILITOT 0.6  --   GFRNONAA >60 >60  ANIONGAP 11 8    Lipids  Recent Labs  Lab 01/29/23 2352  CHOL 192  TRIG 218*  HDL 44  LDLCALC 104*  CHOLHDL 4.4    Hematology Recent Labs  Lab 01/29/23 2347 01/30/23 0441  WBC 7.0 10.4  RBC 4.56 4.21*  HGB 13.6 12.6*  HCT 42.3 38.4*  MCV 92.8 91.2  MCH 29.8 29.9  MCHC 32.2 32.8  RDW 13.2 13.1  PLT 239 214   Thyroid No results for input(s): "TSH", "FREET4" in the last 168 hours.  BNPNo results for input(s): "BNP", "PROBNP" in the last 168 hours.  DDimer No results for input(s): "DDIMER" in the last 168 hours.   Radiology    CARDIAC CATHETERIZATION  Result Date: 01/30/2023   Mid LAD lesion is 90% stenosed.   A stent was successfully placed.   Post intervention, there is a 0% residual stenosis. 1.  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 2.  Elevated LVEDP of 27 mmHg with ejection fraction of approximately 30 to 35% with anterior apical wall motion abnormality. 3.  Difficulty advancing 6 [place] guides up the right upper extremity; the procedure was performed with a 5 [place] guide. Recommendation: The patient was loaded with 180 mg of ticagrelor; given requirement for indefinite anticoagulation due to a history of multiple prior DVTs, will load with 600 mg of Plavix tomorrow and continue triple therapy for 1 month followed by Plavix and anticoagulation for 1 year.  Consider changing to [place] in house.  Will obtain echocardiogram, start high-dose atorvastatin, and beta-blocker.  The results were reviewed with the patient's family.   CT Angio Chest/Abd/Pel for Dissection W  and/or W/WO  Result Date: 01/30/2023 CLINICAL DATA:  Chest pain radiating to the left arm EXAM: CT ANGIOGRAPHY CHEST, ABDOMEN AND PELVIS TECHNIQUE: Non-contrast CT of the chest was initially obtained. Multidetector CT imaging through the chest, abdomen and pelvis was performed using the standard protocol during bolus administration of intravenous contrast. Multiplanar reconstructed images and MIPs were obtained and reviewed to evaluate the vascular anatomy. RADIATION DOSE REDUCTION: This exam was performed according to the departmental dose-optimization program which includes automated exposure control, adjustment of the mA and/or kV according to patient size and/or use of iterative reconstruction technique. CONTRAST:  183mL OMNIPAQUE IOHEXOL 350 MG/ML SOLN COMPARISON:  04/25/2022 FINDINGS: CTA CHEST FINDINGS Cardiovascular: --Heart: The heart size is normal.  There is nopericardial effusion. --Aorta: The course and caliber of the thoracic aorta are normal. There is aortic atherosclerotic calcification. Precontrast images show no aortic intramural hematoma. There is no blood pool, dissection or penetrating ulcer demonstrated on arterial phase postcontrast imaging. There is a conventional 3 vessel aortic arch branching pattern. The proximal arch vessels are widely patent. --Pulmonary Arteries: Contrast timing is optimized for preferential opacification of the aorta. Within that limitation, normal central pulmonary arteries. Mediastinum/Nodes: No mediastinal, hilar or axillary lymphadenopathy. The visualized thyroid and thoracic esophageal course are unremarkable. Lungs/Pleura: No pulmonary nodules or masses. No pleural effusion or pneumothorax. No focal airspace consolidation. No focal pleural abnormality. Musculoskeletal: No chest wall abnormality. No acute osseous findings. Review of the MIP images confirms the above findings. CTA ABDOMEN AND PELVIS FINDINGS VASCULAR Aorta: Normal caliber aorta without aneurysm,  dissection, vasculitis or hemodynamically significant stenosis. There is calcific aortic atherosclerosis. Celiac: No aneurysm, dissection or hemodynamically significant stenosis. Normal branching pattern. SMA: Widely patent without dissection or stenosis. Renals: 2 right and 1 left renal arteries. No aneurysm, dissection, stenosis or evidence of fibromuscular dysplasia. IMA: Patent without abnormality. Inflow: No aneurysm, stenosis or dissection. Veins: Normal course and caliber of the major veins. Assessment is otherwise limited by the arterial dominant contrast phase. Review of the MIP images confirms the above findings. NON-VASCULAR Hepatobiliary: Normal hepatic contours and density. No visible biliary dilatation. Normal gallbladder. Pancreas: Normal contours without ductal dilatation. No peripancreatic fluid collection. Spleen: Normal arterial phase splenic enhancement pattern. Adrenals/Urinary Tract: --Adrenal glands: Normal. --Right kidney/ureter: No hydronephrosis or perinephric stranding. No nephrolithiasis. No obstructing ureteral stones. --Left kidney/ureter: No  hydronephrosis or perinephric stranding. No nephrolithiasis. No obstructing ureteral stones. --Urinary bladder: Unremarkable. Stomach/Bowel: --Stomach/Duodenum: No hiatal hernia or other gastric abnormality. Normal duodenal course and caliber. --Small bowel: No dilatation or inflammation. --Colon: Rectosigmoid diverticulosis without acute inflammation. --Appendix: Not visualized. No right lower quadrant inflammation or free fluid. Lymphatic:  No abdominal or pelvic lymphadenopathy. Reproductive: Normal prostate and seminal vesicles. Musculoskeletal. No bony spinal canal stenosis or focal osseous abnormality. Right hip arthroplasty. Other: None. Review of the MIP images confirms the above findings. IMPRESSION: 1. No acute aortic syndrome. 2. No acute abnormality of the chest, abdomen or pelvis. 3. Rectosigmoid diverticulosis without acute  inflammation. Aortic Atherosclerosis (ICD10-I70.0). Electronically Signed   By: Ulyses Jarred M.D.   On: 01/30/2023 01:00    Cardiac Studies   Echo ordered  Cardiac cath 01/30/23   Mid LAD lesion is 90% stenosed.   A stent was successfully placed.   Post intervention, there is a 0% residual stenosis.   1.  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 2.  Elevated LVEDP of 27 mmHg with ejection fraction of approximately 30 to 35% with anterior apical wall motion abnormality. 3.  Difficulty advancing 6 [place] guides up the right upper extremity; the procedure was performed with a 5 [place] guide.   Recommendation: The patient was loaded with 180 mg of ticagrelor; given requirement for indefinite anticoagulation due to a history of multiple prior DVTs, will load with 600 mg of Plavix tomorrow and continue triple therapy for 1 month followed by Plavix and anticoagulation for 1 year.  Consider changing to [place] in house.  Will obtain echocardiogram, start high-dose atorvastatin, and beta-blocker.  The results were reviewed with the patient's family.  Coronary Diagrams  Diagnostic Dominance: Right  Intervention      Patient Profile     77 y.o. 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, hypertension, and hyperlipidemia who developed acute onset chest pain   Assessment & Plan    Unstable Angina - presented with STEMI - underwent PCI/DES x 2 mLAD with 2 overlapping drug-eluding stents - loaded with 180mg  Ticagrelor in the lab - loaded with plavix 600mg  in 12 hours and plan on plavix daily, ASA and coumadin for 4 weeks followed by plavix and coumadin for 1 year - started on Coreg 6/25mg BID and  HLD - LDL - started on Lipitor 80mg  daily  H/o multiple DVTs - continue indefinite warfarin  For questions or updates, please contact [place] Please consult [email or web address] for contact info under         Signed, Yvetta Drotar Ninfa Meeker, PA-C  01/30/2023, 10:51 AM

## 2023-01-30 NOTE — ED Notes (Signed)
Called [facility] spoke with [name] [name] and activated a CODE STEMI per Dr. Karma Greaser request

## 2023-01-30 NOTE — ED Notes (Signed)
Cardiology, MD in with patient.

## 2023-01-30 NOTE — ED Notes (Signed)
Date and time results received: 01/30/23 0026 (use smartphrase ".now" to insert current time)  Test: Troponin  Critical Value: 312  Name of Provider Notified: Dr. Karma Greaser

## 2023-01-30 NOTE — Plan of Care (Signed)
Patient seen and examined this morning, complaining of nauseous and lethargic.  Hide PVCs as per nurse.  Magnesium and potassium repleted.  Rest of the assessment plan as per HPI.

## 2023-01-30 NOTE — Progress Notes (Signed)
ANTICOAGULATION CONSULT NOTE - Initial Consult  Pharmacy Consult for Warfarin  Indication: VTE treatment   Allergies  Allergen Reactions   Rosuvastatin Other (See Comments)   Codeine Nausea Only   Sulfa Antibiotics Nausea And Vomiting    Patient Measurements: Height: 5\' 10"  (177.8 cm) Weight: 93.9 kg (207 lb) IBW/kg (Calculated) : 73 Heparin Dosing Weight:   Vital Signs: Temp: 97.9 F (36.6 C) (03/16 0400) Temp Source: Oral (03/16 0400) BP: 119/63 (03/16 0400) Pulse Rate: 83 (03/16 0400)  Labs: Recent Labs    01/29/23 2347 01/30/23 0124 01/30/23 0441 01/30/23 0616  HGB 13.6  --  12.6*  --   HCT 42.3  --  38.4*  --   PLT 239  --  214  --   APTT 44*  --   --   --   LABPROT 21.9*  --   --  24.1*  INR 1.9*  --   --  2.2*  CREATININE 1.01  --  0.97  --   TROPONINIHS 312* 397*  --   --     Estimated Creatinine Clearance: 73.4 mL/min (by C-G formula based on SCr of 0.97 mg/dL).   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   Prostate cancer ([place]) 2018   Treated with radiation   Rectal bleeding    2013, 2020    Medications:  Medications Prior to Admission  Medication Sig Dispense Refill Last Dose   acetaminophen (TYLENOL) 500 MG tablet Take 1,000 mg by mouth at bedtime as needed.   prn at prn   Cyanocobalamin 2500 MCG SUBL Take 2,500 mcg by mouth daily. Takes at 11 am   01/29/2023   Glucosamine-Chondroitin (OSTEO BI-FLEX REGULAR STRENGTH PO) Take 2 tablets by mouth daily. Takes at 11 am   01/29/2023   ipratropium (ATROVENT) 0.03 % nasal spray Place 1 spray into the nose daily.   01/29/2023    pravastatin (PRAVACHOL) 10 MG tablet Take 10 mg by mouth daily.   01/29/2023   tamsulosin (FLOMAX) 0.4 MG CAPS capsule Take 0.4 mg by mouth daily with lunch.   01/29/2023   triamterene-hydrochlorothiazide (MAXZIDE-25) 37.5-25 MG tablet Take 0.5 tablets by mouth daily with lunch. Takes at 11 am   01/29/2023   warfarin (COUMADIN) 3 MG tablet Take 3 mg by mouth as directed. Takes on Mondays, Tuesday, Wednesdays, Thursday and Fridays   01/29/2023   warfarin (COUMADIN) 4 MG tablet Take 4 mg by mouth as directed. Takes on Sat and Sun.   01/29/2023   ondansetron (ZOFRAN-ODT) 4 MG disintegrating tablet Take 1 tablet (4 mg total) by mouth every 8 (eight) hours as needed for nausea or vomiting. (Patient not taking: Reported on 01/30/2023) 20 tablet 0 Not Taking   oxyCODONE-acetaminophen (PERCOCET) 5-325 MG tablet Take 1 tablet by mouth every 4 (four) hours as needed. (Patient not taking: Reported on 01/30/2023) 12 tablet 0 Not Taking   phosphorus (K PHOS NEUTRAL) [id number] MG tablet Take 1 tablet (250 mg total) by mouth 3 (three) times daily. (Patient not taking:  Reported on 01/30/2023) 6 tablet 0 Not Taking   rosuvastatin (CRESTOR) 5 MG tablet Take 5 mg by mouth daily with lunch. (Patient not taking: Reported on 01/30/2023)   Not Taking    Assessment: Pharmacy consulted to dose warfarin for VTE treatment in this 77 year old admitted with NSTEMI, S/P cardiac cath.   Pt was on warfarin 3 mg PO M,Tue,Wed,Thur, Fri and Warfarin 4 mg PO every Sat and Sun.   Last dose on 3/15.   3/16 @ 0616:  INR = 2.2   Goal of Therapy:  INR 2-3   Plan:  3/16:  INR @ 0616 = 2.2, therapeuitc  Will continue home warfarin dose and check INR on 3/17 with AM labs.   Warfarin 3 mg PO Q Mon-Tue-Wed-Thurs-Fri ordered and Warfarin 4 mg PO Q Sat - Sun ordered to start 3/16 @ 1600.   Melany Wiesman D 01/30/2023,6:38 AM

## 2023-01-30 NOTE — Assessment & Plan Note (Signed)
S/p cardiac cath DES to LAD Orders per cardiology Cardiology consulted to follow

## 2023-01-30 NOTE — Consult Note (Signed)
CARDIOLOGY CONSULT NOTE               Patient ID: [name] [name] MRN: [id number] DOB/AGE: 01/27/1946 77 y.o.  Admit date: 01/29/2023 Referring Physician Dr. Judd Gaudier hospitalist Primary Physician Dr. Remo Lipps primary Primary Cardiologist  Reason for Consultation non-STEMI  HPI: Patient is 77 year old male history of multiple DVTs hypertension hyperlipidemia presented with new onset of chest discomfort left arm discomfort presents emergency room with chronic underlying left bundle branch block but with persistent symptoms he was taken to the Cath Lab emergently as a non-STEMI and was found to have high-grade thrombotic lesion in the LAD so the patient underwent overlapping stents with DES and has done reasonably well.  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   Prostate cancer ([place]) 2018   Treated with radiation   Rectal bleeding    2013, 2020    Past Surgical History:  Procedure Laterality Date   COLONOSCOPY N/A 07/02/2019   Procedure: COLONOSCOPY;  Surgeon: Lin Landsman, MD;  Location: [facility] ENDOSCOPY;  Service: Gastroenterology;  Laterality: N/A;    COLONOSCOPY     2001, 2011   CYSTOSCOPY W/ RETROGRADES Right 04/06/2022   Procedure: CYSTOSCOPY WITH RETROGRADE PYELOGRAM;  Surgeon: Hollice Espy, MD;  Location: [facility] ORS;  Service: Urology;  Laterality: Right;   CYSTOSCOPY/URETEROSCOPY/HOLMIUM LASER/STENT PLACEMENT Right 04/06/2022   Procedure: CYSTOSCOPY/URETEROSCOPY/HOLMIUM LASER/STENT PLACEMENT;  Surgeon: Hollice Espy, MD;  Location: [facility] ORS;  Service: Urology;  Laterality: Right;   CYSTOSCOPY/URETEROSCOPY/HOLMIUM LASER/STENT PLACEMENT Right 04/21/2022   Procedure: CYSTOSCOPY/URETEROSCOPY/HOLMIUM LASER/STENT EXCHANGE;  Surgeon: Abbie Sons, MD;  Location: [facility] ORS;  Service: Urology;  Laterality: Right;   ESOPHAGOGASTRODUODENOSCOPY N/A 07/02/2019   Procedure: ESOPHAGOGASTRODUODENOSCOPY (EGD);  Surgeon: Lin Landsman, MD;  Location: [facility] ENDOSCOPY;  Service: Gastroenterology;  Laterality: N/A;   ESOPHAGOGASTRODUODENOSCOPY  2013   FOOT FRACTURE SURGERY Right 2012   Heel fracture repair/ has metal, fell off ladder   HERNIA REPAIR Right 2005   inguinal   IVC FILTER INSERTION N/A 09/23/2020   Procedure: IVC FILTER INSERTION;  Surgeon: Algernon Huxley, MD;  Location: [place] [facility] LAB;  Service: Cardiovascular;  Laterality: N/A;   IVC FILTER REMOVAL N/A 11/13/2020   Procedure: IVC FILTER REMOVAL;  Surgeon: Algernon Huxley, MD;  Location: [place] CV LAB;  Service: Cardiovascular;  Laterality: N/A;   TONSILLECTOMY     as a child   TOTAL HIP ARTHROPLASTY Right 10/21/2020   Procedure: TOTAL HIP ARTHROPLASTY;  Surgeon: Dereck Leep, MD;  Location: [facility] ORS;  Service: Orthopedics;  Laterality: Right;  XI ROBOTIC ASSISTED INGUINAL HERNIA REPAIR WITH MESH Right 07/18/2021   Procedure: XI ROBOTIC ASSISTED INGUINAL HERNIA REPAIR WITH MESH;  Surgeon: Benjamine Sprague, DO;  Location: [facility] ORS;  Service: General;  Laterality: Right;    Medications Prior to Admission  Medication Sig Dispense Refill Last Dose   acetaminophen (TYLENOL) 500  MG tablet Take 1,000 mg by mouth at bedtime as needed.   prn at prn   Cyanocobalamin 2500 MCG SUBL Take 2,500 mcg by mouth daily. Takes at 11 am   01/29/2023   Glucosamine-Chondroitin (OSTEO BI-FLEX REGULAR STRENGTH PO) Take 2 tablets by mouth daily. Takes at 11 am   01/29/2023   ipratropium (ATROVENT) 0.03 % nasal spray Place 1 spray into the nose daily.   01/29/2023   pravastatin (PRAVACHOL) 10 MG tablet Take 10 mg by mouth daily.   01/29/2023   tamsulosin (FLOMAX) 0.4 MG CAPS capsule Take 0.4 mg by mouth daily with lunch.   01/29/2023   triamterene-hydrochlorothiazide (MAXZIDE-25) 37.5-25 MG tablet Take 0.5 tablets by mouth daily with lunch. Takes at 11 am   01/29/2023   warfarin (COUMADIN) 3 MG tablet Take 3 mg by mouth as directed. Takes on Mondays, Tuesday, Wednesdays, Thursday and Fridays   01/29/2023   warfarin (COUMADIN) 4 MG tablet Take 4 mg by mouth as directed. Takes on Sat and Sun.   01/29/2023   ondansetron (ZOFRAN-ODT) 4 MG disintegrating tablet Take 1 tablet (4 mg total) by mouth every 8 (eight) hours as needed for nausea or vomiting. (Patient not taking: Reported on 01/30/2023) 20 tablet 0 Not Taking   oxyCODONE-acetaminophen (PERCOCET) 5-325 MG tablet Take 1 tablet by mouth every 4 (four) hours as needed. (Patient not taking: Reported on 01/30/2023) 12 tablet 0 Not Taking   phosphorus (K PHOS NEUTRAL) 155-852-130 MG tablet Take 1 tablet (250 mg total) by mouth 3 (three) times daily. (Patient not taking: Reported on 01/30/2023) 6 tablet 0 Not Taking   rosuvastatin (CRESTOR) 5 MG tablet Take 5 mg by mouth daily with lunch. (Patient not taking: Reported on 01/30/2023)   Not Taking   Social History   Socioeconomic History   Marital status: Married    Spouse name: [name]   Number of children: 2   Years of education: Not on file   Highest education level: Not on file  Occupational History   Not on file  Tobacco Use   Smoking status: Former    Types: Cigarettes    Quit date: 1982    Years  since quitting: 42.2   Smokeless tobacco: Never  Vaping Use   Vaping Use: Never used  Substance and Sexual Activity   Alcohol use: Not Currently   Drug use: Never   Sexual activity: Not on file  Other Topics Concern   Not on file  Social History Narrative   Not on file   Social Determinants of Health   Financial Resource Strain: Not on file  Food Insecurity: No Food Insecurity (01/30/2023)   Hunger Vital Sign    Worried About Running Out of Food in the Last Year: Never true    Ran Out of Food in the Last Year: Never true  Transportation Needs: No Transportation Needs (01/30/2023)   PRAPARE - Hydrologist (Medical): No    Lack of Transportation (Non-Medical): No  Physical Activity: Not on file  Stress: Not on file  Social Connections: Not on file  Intimate Partner Violence: Not At Risk (01/30/2023)   Humiliation, Afraid, Rape,  and Kick questionnaire    Fear of Current or Ex-Partner: No    Emotionally Abused: No    Physically Abused: No    Sexually Abused: No    Family History  Problem Relation Age of Onset   Heart attack Father       Review of systems complete and found to be negative unless listed above      PHYSICAL EXAM  General: Well developed, well nourished, in no acute distress HEENT:  Normocephalic and atramatic Neck:  No JVD.  Lungs: Clear bilaterally to auscultation and percussion. Heart: HRRR . Normal S1 and S2 without gallops or murmurs.  Abdomen: Bowel sounds are positive, abdomen soft and non-tender  Msk:  Back normal, normal gait. Normal strength and tone for age. Extremities: No clubbing, cyanosis or edema.   Neuro: Alert and oriented X 3. Psych:  Good affect, responds appropriately  Labs:   Lab Results  Component Value Date   WBC 10.4 01/30/2023   HGB 12.6 (L) 01/30/2023   HCT 38.4 (L) 01/30/2023   MCV 91.2 01/30/2023   PLT 214 01/30/2023    Recent Labs  Lab 01/29/23 2347 01/30/23 0441  NA 139 137  K  3.4* 3.7  CL 103 105  CO2 25 24  BUN 23 21  CREATININE 1.01 0.97  CALCIUM 9.4 8.6*  PROT 7.5  --   BILITOT 0.6  --   ALKPHOS 79  --   ALT 14  --   AST 23  --   GLUCOSE 105* 126*   No results found for: "CKTOTAL", "CKMB", "CKMBINDEX", "TROPONINI"  Lab Results  Component Value Date   CHOL 192 01/29/2023   Lab Results  Component Value Date   HDL 44 01/29/2023   Lab Results  Component Value Date   LDLCALC 104 (H) 01/29/2023   Lab Results  Component Value Date   TRIG 218 (H) 01/29/2023   Lab Results  Component Value Date   CHOLHDL 4.4 01/29/2023   No results found for: "LDLDIRECT"    Radiology: CARDIAC CATHETERIZATION  Result Date: 01/30/2023   Mid LAD lesion is 90% stenosed.   A stent was successfully placed.   Post intervention, there is a 0% residual stenosis. 1.  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 2.  Elevated LVEDP of 27 mmHg with ejection fraction of approximately 30 to 35% with anterior apical wall motion abnormality. 3.  Difficulty advancing 6 [place] guides up the right upper extremity; the procedure was performed with a 5 [place] guide. Recommendation: The patient was loaded with 180 mg of ticagrelor; given requirement for indefinite anticoagulation due to a history of multiple prior DVTs, will load with 600 mg of Plavix tomorrow and continue triple therapy for 1 month followed by Plavix and anticoagulation for 1 year.  Consider changing to [place] in house.  Will obtain echocardiogram, start high-dose atorvastatin, and beta-blocker.  The results were reviewed with the patient's family.   CT Angio Chest/Abd/Pel for Dissection W and/or W/WO  Result Date: 01/30/2023 CLINICAL DATA:  Chest pain radiating to the left arm EXAM: CT ANGIOGRAPHY CHEST, ABDOMEN AND PELVIS TECHNIQUE: Non-contrast CT of the chest was initially obtained. Multidetector CT imaging through the chest, abdomen and pelvis was performed using the standard  protocol during bolus administration of intravenous contrast. Multiplanar reconstructed images and MIPs were obtained and reviewed to evaluate the vascular anatomy. RADIATION DOSE REDUCTION: This exam was performed according to the departmental dose-optimization program which includes  automated exposure control, adjustment of the mA and/or kV according to patient size and/or use of iterative reconstruction technique. CONTRAST:  120mL OMNIPAQUE IOHEXOL 350 MG/ML SOLN COMPARISON:  04/25/2022 FINDINGS: CTA CHEST FINDINGS Cardiovascular: --Heart: The heart size is normal.  There is nopericardial effusion. --Aorta: The course and caliber of the thoracic aorta are normal. There is aortic atherosclerotic calcification. Precontrast images show no aortic intramural hematoma. There is no blood pool, dissection or penetrating ulcer demonstrated on arterial phase postcontrast imaging. There is a conventional 3 vessel aortic arch branching pattern. The proximal arch vessels are widely patent. --Pulmonary Arteries: Contrast timing is optimized for preferential opacification of the aorta. Within that limitation, normal central pulmonary arteries. Mediastinum/Nodes: No mediastinal, hilar or axillary lymphadenopathy. The visualized thyroid and thoracic esophageal course are unremarkable. Lungs/Pleura: No pulmonary nodules or masses. No pleural effusion or pneumothorax. No focal airspace consolidation. No focal pleural abnormality. Musculoskeletal: No chest wall abnormality. No acute osseous findings. Review of the MIP images confirms the above findings. CTA ABDOMEN AND PELVIS FINDINGS VASCULAR Aorta: Normal caliber aorta without aneurysm, dissection, vasculitis or hemodynamically significant stenosis. There is calcific aortic atherosclerosis. Celiac: No aneurysm, dissection or hemodynamically significant stenosis. Normal branching pattern. SMA: Widely patent without dissection or stenosis. Renals: 2 right and 1 left renal arteries.  No aneurysm, dissection, stenosis or evidence of fibromuscular dysplasia. IMA: Patent without abnormality. Inflow: No aneurysm, stenosis or dissection. Veins: Normal course and caliber of the major veins. Assessment is otherwise limited by the arterial dominant contrast phase. Review of the MIP images confirms the above findings. NON-VASCULAR Hepatobiliary: Normal hepatic contours and density. No visible biliary dilatation. Normal gallbladder. Pancreas: Normal contours without ductal dilatation. No peripancreatic fluid collection. Spleen: Normal arterial phase splenic enhancement pattern. Adrenals/Urinary Tract: --Adrenal glands: Normal. --Right kidney/ureter: No hydronephrosis or perinephric stranding. No nephrolithiasis. No obstructing ureteral stones. --Left kidney/ureter: No hydronephrosis or perinephric stranding. No nephrolithiasis. No obstructing ureteral stones. --Urinary bladder: Unremarkable. Stomach/Bowel: --Stomach/Duodenum: No hiatal hernia or other gastric abnormality. Normal duodenal course and caliber. --Small bowel: No dilatation or inflammation. --Colon: Rectosigmoid diverticulosis without acute inflammation. --Appendix: Not visualized. No right lower quadrant inflammation or free fluid. Lymphatic:  No abdominal or pelvic lymphadenopathy. Reproductive: Normal prostate and seminal vesicles. Musculoskeletal. No bony spinal canal stenosis or focal osseous abnormality. Right hip arthroplasty. Other: None. Review of the MIP images confirms the above findings. IMPRESSION: 1. No acute aortic syndrome. 2. No acute abnormality of the chest, abdomen or pelvis. 3. Rectosigmoid diverticulosis without acute inflammation. Aortic Atherosclerosis (ICD10-I70.0). Electronically Signed   By: Ulyses Jarred M.D.   On: 01/30/2023 01:00    EKG: Normal sinus rhythm left bundle branch block rate of 80  Echo l preliminary shows large area of anterior apical septal akinesis ejection fraction around 35 to  40%  ASSESSMENT AND PLAN:  Non-STEMI high-grade lesion in LAD status post PCI and stent Elevated troponin History of DVT GERD Hyperlipidemia Hypertension Abnormal EKG left bundle branch block chronic Cardiomyopathy  Plan Status post PCI and stent DES to LAD currently on Plavix hopefully for least 1 year aspirin for 4 weeks continue Coumadin History of DVT continue Coumadin therapy for anticoagulation Refer the patient to cardiac rehab Resume blood pressure medications will start a beta-blocker Coreg or metoprolol recommend ACE ARB or Arni Cardiomyopathy on catheterization EF of 30 to 35% underlying bundle branch block which is chronic old Myoview suggested normal left ventricular function in 2021 v we will treat cardiomyopathy consider adding spironolactone and/or Farxiga Hypertension  management and control will recommend Coreg and ACE ARB or Arni consider adding spironolactone Will increase activity have the patient ambulate in the halls tomorrow Hopefully will plan early discharge if patient remains stable Will have the patient follow-up with heart failure clinic here at [facility] clinic with Dr. Mali Patel Follow-up with cardiology 1 to 2 weeks    Signed: Yolonda Kida MD, 01/30/2023, 11:52 AM

## 2023-01-30 NOTE — Assessment & Plan Note (Signed)
Continue Coumadin.  Pharmacy consulted to manage

## 2023-01-30 NOTE — Consult Note (Signed)
Cardiology Consultation   Patient ID: KANEN HORITA MRN: [id number]; DOB: November 13, 1946  Admit date: 01/29/2023 Date of Consult: 01/30/2023  PCP:  Adin Hector, MD   [place] Providers Cardiologist:  None          History of Present Illness:   Mr. 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  His EKG did not demonstrate acute ischemic changes.  He had a CT scan which ruled out an aortic dissection and pulmonary embolism.  Due to ongoing chest pain despite a normal blood pressure and heart rate he was referred for emergency coronary angiography.  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  Of note his LVEDP was found to be 27 mmHg and he received Lasix 10 mg IV x 1 in the cardiac catheterization laboratory.  He was rendered chest pain-free following intervention.   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   Prostate cancer ([place]) 2018   Treated with radiation   Rectal bleeding    2013, 2020    Past Surgical History:  Procedure Laterality Date   COLONOSCOPY N/A 07/02/2019   Procedure: COLONOSCOPY;  Surgeon: Lin Landsman, MD;  Location: [facility] ENDOSCOPY;  Service: Gastroenterology;  Laterality: N/A;   COLONOSCOPY     2001, 2011   CYSTOSCOPY W/ RETROGRADES Right 04/06/2022   Procedure: CYSTOSCOPY WITH RETROGRADE PYELOGRAM;  Surgeon: Hollice Espy, MD;  Location: [facility] ORS;  Service: Urology;  Laterality:  Right;   CYSTOSCOPY/URETEROSCOPY/HOLMIUM LASER/STENT PLACEMENT Right 04/06/2022   Procedure: CYSTOSCOPY/URETEROSCOPY/HOLMIUM LASER/STENT PLACEMENT;  Surgeon: Hollice Espy, MD;  Location: [facility] ORS;  Service: Urology;  Laterality: Right;   CYSTOSCOPY/URETEROSCOPY/HOLMIUM LASER/STENT PLACEMENT Right 04/21/2022   Procedure: CYSTOSCOPY/URETEROSCOPY/HOLMIUM LASER/STENT EXCHANGE;  Surgeon: Abbie Sons, MD;  Location: [facility] ORS;  Service: Urology;  Laterality: Right;   ESOPHAGOGASTRODUODENOSCOPY N/A 07/02/2019   Procedure: ESOPHAGOGASTRODUODENOSCOPY (EGD);  Surgeon: Lin Landsman, MD;  Location: [facility] ENDOSCOPY;  Service: Gastroenterology;  Laterality: N/A;   ESOPHAGOGASTRODUODENOSCOPY  2013   FOOT FRACTURE SURGERY Right 2012   Heel fracture repair/ has metal, fell off ladder   HERNIA REPAIR Right 2005   inguinal   IVC FILTER INSERTION N/A 09/23/2020   Procedure: IVC FILTER INSERTION;  Surgeon: Algernon Huxley, MD;  Location: [place] CV LAB;  Service: Cardiovascular;  Laterality: N/A;   IVC FILTER REMOVAL N/A 11/13/2020   Procedure: IVC FILTER REMOVAL;  Surgeon: Algernon Huxley, MD;  Location: [place] CV LAB;  Service: Cardiovascular;  Laterality: N/A;   TONSILLECTOMY     as a child   TOTAL HIP ARTHROPLASTY Right 10/21/2020   Procedure: TOTAL HIP ARTHROPLASTY;  Surgeon: Dereck Leep, MD;  Location: [facility] ORS;  Service: Orthopedics;  Laterality: Right;   XI ROBOTIC ASSISTED INGUINAL HERNIA REPAIR WITH MESH Right 07/18/2021   Procedure: XI ROBOTIC ASSISTED INGUINAL HERNIA REPAIR WITH MESH;  Surgeon: Benjamine Sprague, DO;  Location: [facility] ORS;  Service: General;  Laterality: Right;       Inpatient Medications: Scheduled Meds:  [START ON 01/31/2023] aspirin  81 mg Oral Daily   [START ON 01/31/2023] clopidogrel  75 mg Oral Q breakfast   furosemide  20 mg Oral Daily   Continuous Infusions:  sodium chloride     PRN Meds: fentaNYL, furosemide, Heparin (Porcine) in NaCl, heparin sodium  (porcine), iohexol, midazolam, [MAR Hold] nitroGLYCERIN, ticagrelor, verapamil  Allergies:    Allergies  Allergen Reactions   Rosuvastatin Other (See Comments)   Codeine Nausea Only   Sulfa Antibiotics Nausea And Vomiting    Social History:   Social History   Socioeconomic History   Marital status: Married    Spouse name: [name]   Number of children: 2   Years of education: Not on file   Highest education level: Not on file  Occupational History   Not on file  Tobacco Use   Smoking status: Former    Types: Cigarettes    Quit date: 1982    Years since quitting: 42.2   Smokeless tobacco: Never  Vaping Use   Vaping Use: Never used  Substance and Sexual Activity   Alcohol use: Not Currently   Drug use: Never   Sexual activity: Not on file  Other Topics Concern   Not on file  Social History Narrative   Not on file   Social Determinants of Health   Financial Resource Strain: Not on file  Food Insecurity: Not on file  Transportation Needs: Not on file  Physical Activity: Not on file  Stress: Not on file  Social Connections: Not on file  Intimate Partner Violence: Not on file    Family History:    Family History  Problem Relation Age of Onset   Heart attack Father      ROS:  Please see the history of present illness.   All other ROS reviewed and negative.     Physical Exam/Data:   Vitals:   01/30/23 0030 01/30/23 0110 01/30/23 0130 01/30/23 0206  BP: 129/68 127/66 125/66   Pulse: 77 84 91   Resp: 19 (!) 21 (!) 26   Temp:  (!) 97.5 F (36.4 C)    TempSrc:  Oral    SpO2: 99% 100% 100% 98%  Weight:      Height:       No intake or output data in the 24 hours ending 01/30/23 0325    01/29/2023   11:28 PM 04/25/2022    4:55 AM 04/21/2022   10:07 AM  Last 3 Weights  Weight (lbs) 207 lb 202 lb 13.2 oz 205 lb  Weight (kg) 93.895 kg 92 kg 92.987 kg     Body mass index is 29.7 kg/m.  General:  Well nourished, well developed, in no acute distress HEENT:  normal Neck: no JVD Vascular: No carotid bruits; Distal pulses 2+ bilaterally: TR band R radial Cardiac:  normal S1, S2; RRR; no murmur  Lungs:  clear to auscultation bilaterally, no wheezing, rhonchi or rales  Abd: soft, nontender, no hepatomegaly  Ext: no edema Musculoskeletal:  No deformities, BUE and BLE strength normal and equal Skin: warm and Fina  Neuro:  CNs 2-12 intact, no focal abnormalities noted Psych:  Normal affect   EKG:  The EKG was personally reviewed and demonstrates: Sinus rhythm with IVCD   Relevant CV Studies:   Lexiscan 2021 The study is normal. This is a low risk  study. The left ventricular ejection fraction is normal (55-65%). Blood pressure demonstrated a normal response to exercise. There was no ST segment deviation noted during stress.  Laboratory Data:  High Sensitivity Troponin:   Recent Labs  Lab 01/29/23 2347 01/30/23 0124  TROPONINIHS 312* 397*     Chemistry Recent Labs  Lab 01/29/23 2347  NA 139  K 3.4*  CL 103  CO2 25  GLUCOSE 105*  BUN 23  CREATININE 1.01  CALCIUM 9.4  GFRNONAA >60  ANIONGAP 11    Recent Labs  Lab 01/29/23 2347  PROT 7.5  ALBUMIN 4.3  AST 23  ALT 14  ALKPHOS 79  BILITOT 0.6   Lipids  Recent Labs  Lab 01/29/23 2352  CHOL 192  TRIG 218*  HDL 44  LDLCALC 104*  CHOLHDL 4.4    Hematology Recent Labs  Lab 01/29/23 2347  WBC 7.0  RBC 4.56  HGB 13.6  HCT 42.3  MCV 92.8  MCH 29.8  MCHC 32.2  RDW 13.2  PLT 239   Thyroid No results for input(s): "TSH", "FREET4" in the last 168 hours.  BNPNo results for input(s): "BNP", "PROBNP" in the last 168 hours.  DDimer No results for input(s): "DDIMER" in the last 168 hours.   Radiology/Studies:  CT Angio Chest/Abd/Pel for Dissection W and/or W/WO  Result Date: 01/30/2023 CLINICAL DATA:  Chest pain radiating to the left arm EXAM: CT ANGIOGRAPHY CHEST, ABDOMEN AND PELVIS TECHNIQUE: Non-contrast CT of the chest was initially obtained. Multidetector CT  imaging through the chest, abdomen and pelvis was performed using the standard protocol during bolus administration of intravenous contrast. Multiplanar reconstructed images and MIPs were obtained and reviewed to evaluate the vascular anatomy. RADIATION DOSE REDUCTION: This exam was performed according to the departmental dose-optimization program which includes automated exposure control, adjustment of the mA and/or kV according to patient size and/or use of iterative reconstruction technique. CONTRAST:  139mL OMNIPAQUE IOHEXOL 350 MG/ML SOLN COMPARISON:  04/25/2022 FINDINGS: CTA CHEST FINDINGS Cardiovascular: --Heart: The heart size is normal.  There is nopericardial effusion. --Aorta: The course and caliber of the thoracic aorta are normal. There is aortic atherosclerotic calcification. Precontrast images show no aortic intramural hematoma. There is no blood pool, dissection or penetrating ulcer demonstrated on arterial phase postcontrast imaging. There is a conventional 3 vessel aortic arch branching pattern. The proximal arch vessels are widely patent. --Pulmonary Arteries: Contrast timing is optimized for preferential opacification of the aorta. Within that limitation, normal central pulmonary arteries. Mediastinum/Nodes: No mediastinal, hilar or axillary lymphadenopathy. The visualized thyroid and thoracic esophageal course are unremarkable. Lungs/Pleura: No pulmonary nodules or masses. No pleural effusion or pneumothorax. No focal airspace consolidation. No focal pleural abnormality. Musculoskeletal: No chest wall abnormality. No acute osseous findings. Review of the MIP images confirms the above findings. CTA ABDOMEN AND PELVIS FINDINGS VASCULAR Aorta: Normal caliber aorta without aneurysm, dissection, vasculitis or hemodynamically significant stenosis. There is calcific aortic atherosclerosis. Celiac: No aneurysm, dissection or hemodynamically significant stenosis. Normal branching pattern. SMA: Widely  patent without dissection or stenosis. Renals: 2 right and 1 left renal arteries. No aneurysm, dissection, stenosis or evidence of fibromuscular dysplasia. IMA: Patent without abnormality. Inflow: No aneurysm, stenosis or dissection. Veins: Normal course and caliber of the major veins. Assessment is otherwise limited by the arterial dominant contrast phase. Review of the MIP images confirms the above findings. NON-VASCULAR Hepatobiliary: Normal hepatic contours and density. No visible biliary dilatation. Normal gallbladder. Pancreas: Normal contours without ductal dilatation. No peripancreatic fluid  collection. Spleen: Normal arterial phase splenic enhancement pattern. Adrenals/Urinary Tract: --Adrenal glands: Normal. --Right kidney/ureter: No hydronephrosis or perinephric stranding. No nephrolithiasis. No obstructing ureteral stones. --Left kidney/ureter: No hydronephrosis or perinephric stranding. No nephrolithiasis. No obstructing ureteral stones. --Urinary bladder: Unremarkable. Stomach/Bowel: --Stomach/Duodenum: No hiatal hernia or other gastric abnormality. Normal duodenal course and caliber. --Small bowel: No dilatation or inflammation. --Colon: Rectosigmoid diverticulosis without acute inflammation. --Appendix: Not visualized. No right lower quadrant inflammation or free fluid. Lymphatic:  No abdominal or pelvic lymphadenopathy. Reproductive: Normal prostate and seminal vesicles. Musculoskeletal. No bony spinal canal stenosis or focal osseous abnormality. Right hip arthroplasty. Other: None. Review of the MIP images confirms the above findings. IMPRESSION: 1. No acute aortic syndrome. 2. No acute abnormality of the chest, abdomen or pelvis. 3. Rectosigmoid diverticulosis without acute inflammation. Aortic Atherosclerosis (ICD10-I70.0). Electronically Signed   By: Ulyses Jarred M.D.   On: 01/30/2023 01:00     Assessment and Plan:   Unstable angina: The patient underwent stenting of his mid LAD with 2  overlapping drug-eluting stents.  He is rendered chest pain-free.  He was loaded with 180 mg of ticagrelor in the cardiac catheterization laboratory.  Given that he will require triple therapy I have ordered a 600 mg Plavix load in 12 hours and will plan on Plavix, aspirin, and Coumadin for 4 weeks followed by Plavix and Coumadin for 1 year.  Start high-dose atorvastatin and Coreg 6.25 mg twice daily.  Will obtain echocardiogram to evaluate LV function but looks to be somewhat diminished on ventriculography.  Given elevated LVEDP will start Lasix 20 mg daily. Hyperlipidemia: Will start atorvastatin 80 mg daily. 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   Risk Assessment/Risk Scores:     TIMI Risk Score for Unstable Angina or Non-ST Elevation MI:   The patient's TIMI risk score is 3, which indicates a 13% risk of all cause mortality, new or recurrent myocardial infarction or need for urgent revascularization in the next 14 days.  New York Heart Association (NYHA) Functional Class NYHA Class II        For questions or updates, please contact [place] Please consult [email or web address] for contact info under    Signed, Early Osmond, MD  01/30/2023 3:25 AM

## 2023-01-30 NOTE — ED Notes (Signed)
Cardiologist at bedside.  

## 2023-01-30 NOTE — Assessment & Plan Note (Signed)
No acute issues suspected 

## 2023-01-30 NOTE — Assessment & Plan Note (Signed)
Continue carvedilol.  Hold Dyazide

## 2023-01-31 ENCOUNTER — Encounter: Payer: Self-pay | Admitting: Internal Medicine

## 2023-01-31 LAB — BASIC METABOLIC PANEL
Anion gap: 5 (ref 5–15)
BUN: 18 mg/dL (ref 8–23)
CO2: 24 mmol/L (ref 22–32)
Calcium: 8.2 mg/dL — ABNORMAL LOW (ref 8.9–10.3)
Chloride: 108 mmol/L (ref 98–111)
Creatinine, Ser: 1.06 mg/dL (ref 0.61–1.24)
GFR, Estimated: >60 mL/min (ref >60)
Glucose, Bld: 102 mg/dL — ABNORMAL HIGH (ref 70–99)
Potassium: 3.6 mmol/L (ref 3.5–5.1)
Sodium: 137 mmol/L (ref 135–145)

## 2023-01-31 LAB — CBC
HCT: 38.4 % — ABNORMAL LOW (ref 39.0–52.0)
Hemoglobin: 12.3 g/dL — ABNORMAL LOW (ref 13.0–17.0)
MCH: 29.6 pg (ref 26.0–34.0)
MCHC: 32 g/dL (ref 30.0–36.0)
MCV: 92.5 fL (ref 80.0–100.0)
Platelets: 215 10*3/uL (ref 150–400)
RBC: 4.15 MIL/uL — ABNORMAL LOW (ref 4.22–5.81)
RDW: 13.4 % (ref 11.5–15.5)
WBC: 7.5 10*3/uL (ref 4.0–10.5)
nRBC: 0 % (ref 0.0–0.2)

## 2023-01-31 LAB — MAGNESIUM: Magnesium: 2.1 mg/dL (ref 1.7–2.4)

## 2023-01-31 LAB — PROTIME-INR
INR: 2.4 — ABNORMAL HIGH (ref 0.8–1.2)
Prothrombin Time: 25.7 seconds — ABNORMAL HIGH (ref 11.4–15.2)

## 2023-01-31 MED ORDER — CLOPIDOGREL BISULFATE 75 MG PO TABS: 75.0000 mg | ORAL_TABLET | Freq: Every day | ORAL | 1 refills | Status: DC

## 2023-01-31 MED ORDER — SPIRONOLACTONE 25 MG PO TABS: 12.5000 mg | ORAL_TABLET | Freq: Every day | ORAL | 1 refills | Status: DC

## 2023-01-31 MED ORDER — ATORVASTATIN CALCIUM 80 MG PO TABS: 80.0000 mg | ORAL_TABLET | Freq: Every day | ORAL | 1 refills | Status: DC

## 2023-01-31 MED ORDER — CARVEDILOL 6.25 MG PO TABS: 6.2500 mg | ORAL_TABLET | Freq: Two times a day (BID) | ORAL | 3 refills | Status: AC

## 2023-01-31 MED ORDER — NITROGLYCERIN 0.4 MG SL SUBL: 0.4000 mg | SUBLINGUAL_TABLET | SUBLINGUAL | 0 refills | Status: AC | PRN

## 2023-01-31 MED ORDER — LISINOPRIL 2.5 MG PO TABS: 5.0000 mg | ORAL_TABLET | Freq: Every day | ORAL | 1 refills | Status: DC

## 2023-01-31 MED ORDER — ASPIRIN 81 MG PO CHEW: 81.0000 mg | CHEWABLE_TABLET | Freq: Every day | ORAL | 1 refills | Status: DC

## 2023-01-31 NOTE — Progress Notes (Signed)
[facility] Cardiology    SUBJECTIVE: Patient denies any chest pain feels reasonably well status post PCI and stent after non-STEMI.  Denies any shortness of breath weakness or fatigue no bleeding no complications postprocedure.  Patient feels well enough to go home   Vitals:   01/31/23 0000 01/31/23 0500 01/31/23 0600 01/31/23 0800  BP: (!) 96/46 (!) 98/50 (!) 103/53 (!) 99/48  Pulse: 72 60 60 61  Resp: 18 12 12 11   Temp: 98.6 F (37 C) 98.1 F (36.7 C)    TempSrc: Oral Oral    SpO2: 92% 93% 96% 93%  Weight:      Height:         Intake/Output Summary (Last 24 hours) at 01/31/2023 1244 Last data filed at 01/31/2023 0600 Gross per 24 hour  Intake --  Output 700 ml  Net -700 ml      PHYSICAL EXAM  General: Well developed, well nourished, in no acute distress HEENT:  Normocephalic and atramatic Neck:  No JVD.  Lungs: Clear bilaterally to auscultation and percussion. Heart: HRRR . Normal S1 and S2 without gallops or murmurs.  Abdomen: Bowel sounds are positive, abdomen soft and non-tender  Msk:  Back normal, normal gait. Normal strength and tone for age. Extremities: No clubbing, cyanosis or edema.   Neuro: Alert and oriented X 3. Psych:  Good affect, responds appropriately   LABS: Basic Metabolic Panel: Recent Labs    01/30/23 0441 01/31/23 0449  NA 137 137  K 3.7 3.6  CL 105 108  CO2 24 24  GLUCOSE 126* 102*  BUN 21 18  CREATININE 0.97 1.06  CALCIUM 8.6* 8.2*  MG  --  2.1   Liver Function Tests: Recent Labs    01/29/23 2347  AST 23  ALT 14  ALKPHOS 79  BILITOT 0.6  PROT 7.5  ALBUMIN 4.3   Recent Labs    01/29/23 2347  LIPASE 45   CBC: Recent Labs    01/29/23 2347 01/30/23 0441 01/31/23 0449  WBC 7.0 10.4 7.5  NEUTROABS 3.0  --   --   HGB 13.6 12.6* 12.3*  HCT 42.3 38.4* 38.4*  MCV 92.8 91.2 92.5  PLT 239 214 215   Cardiac Enzymes: No results for input(s): "CKTOTAL", "CKMB", "CKMBINDEX", "TROPONINI" in the last 72 hours. BNP: Invalid  input(s): "POCBNP" D-Dimer: No results for input(s): "DDIMER" in the last 72 hours. Hemoglobin A1C: No results for input(s): "HGBA1C" in the last 72 hours. Fasting Lipid Panel: Recent Labs    01/29/23 2352  CHOL 192  HDL 44  LDLCALC 104*  TRIG 218*  CHOLHDL 4.4   Thyroid Function Tests: No results for input(s): "TSH", "T4TOTAL", "T3FREE", "THYROIDAB" in the last 72 hours.  Invalid input(s): "FREET3" Anemia Panel: No results for input(s): "VITAMINB12", "FOLATE", "FERRITIN", "TIBC", "IRON", "RETICCTPCT" in the last 72 hours.  ECHOCARDIOGRAM COMPLETE  Result Date: 01/30/2023    ECHOCARDIOGRAM REPORT   Patient Name:   [name] [name] Date of Exam: 01/30/2023 Medical Rec #:  [id number]  Height:       70.0 in Accession #:    [id number] Weight:       207.0 lb Date of Birth:  [birth year]  BSA:          2.118 m Patient Age:    77 years   BP:           130/66 mmHg Patient Gender: M          HR:  82 bpm. Exam Location:  [facility] Procedure: 2D Echo, Color Doppler, Cardiac Doppler and Intracardiac            Opacification Agent Indications:     Acute Myocardial Infarction I21.9  History:         Patient has no prior history of Echocardiogram examinations.                  Risk Factors:Hypertension and Hyperlipidemia.  Sonographer:     L. Thornton-Maynard Referring Phys:  [id number] Early Osmond Diagnosing Phys: Yolonda Kida MD IMPRESSIONS  1. Large area of ant/apical/septal Akinesis.  2. Ant/apical/septal Akinesis. Left ventricular ejection fraction, by estimation, is 35 to 40%. The left ventricle has moderately decreased function. The left ventricle demonstrates regional wall motion abnormalities (see scoring diagram/findings for description). The left ventricular internal cavity size was mildly dilated. There is mild concentric left ventricular hypertrophy. Left ventricular diastolic parameters are consistent with Grade I diastolic dysfunction (impaired relaxation).  3. Right ventricular systolic  function is normal. The right ventricular size is normal.  4. The mitral valve is normal in structure. Moderate to severe mitral valve regurgitation.  5. The aortic valve is normal in structure. There is mild calcification of the aortic valve. There is mild thickening of the aortic valve. Aortic valve regurgitation is not visualized. Aortic valve sclerosis is present, with no evidence of aortic valve stenosis. FINDINGS  Left Ventricle: Ant/apical/septal Akinesis. Left ventricular ejection fraction, by estimation, is 35 to 40%. The left ventricle has moderately decreased function. The left ventricle demonstrates regional wall motion abnormalities. Definity contrast agent was given IV to delineate the left ventricular endocardial borders. The left ventricular internal cavity size was mildly dilated. There is mild concentric left ventricular hypertrophy. Abnormal (paradoxical) septal motion, consistent with left bundle branch block. Left ventricular diastolic parameters are consistent with Grade I diastolic dysfunction (impaired relaxation). Right Ventricle: The right ventricular size is normal. No increase in right ventricular wall thickness. Right ventricular systolic function is normal. Left Atrium: Left atrial size was normal in size. Right Atrium: Right atrial size was normal in size. Pericardium: There is no evidence of pericardial effusion. Mitral Valve: The mitral valve is normal in structure. Moderate to severe mitral valve regurgitation. MV peak gradient, 5.6 mmHg. The mean mitral valve gradient is 3.0 mmHg. Tricuspid Valve: The tricuspid valve is normal in structure. Tricuspid valve regurgitation is mild. Aortic Valve: The aortic valve is normal in structure. There is mild calcification of the aortic valve. There is mild thickening of the aortic valve. There is mild aortic valve annular calcification. Aortic valve regurgitation is not visualized. Aortic valve sclerosis is present, with no evidence of aortic  valve stenosis. Aortic valve mean gradient measures 3.0 mmHg. Aortic valve peak gradient measures 5.4 mmHg. Aortic valve area, by VTI measures 2.85 cm. Pulmonic Valve: The pulmonic valve was normal in structure. Pulmonic valve regurgitation is not visualized. Aorta: The ascending aorta was not well visualized. IAS/Shunts: No atrial level shunt detected by color flow Doppler. Additional Comments: Large area of ant/apical/septal Akinesis.  LEFT VENTRICLE PLAX 2D LVIDd:         4.40 cm      Diastology LVIDs:         3.50 cm      LV e' medial:    5.87 cm/s LV PW:         1.10 cm      LV E/e' medial:  10.9 LV IVS:  1.30 cm      LV e' lateral:   12.20 cm/s LVOT diam:     2.30 cm      LV E/e' lateral: 5.2 LV SV:         61 LV SV Index:   29 LVOT Area:     4.15 cm  LV Volumes (MOD) LV vol d, MOD A2C: 74.0 ml LV vol d, MOD A4C: 106.0 ml LV vol s, MOD A2C: 45.9 ml LV vol s, MOD A4C: 60.7 ml LV SV MOD A2C:     28.1 ml LV SV MOD A4C:     106.0 ml LV SV MOD BP:      37.1 ml RIGHT VENTRICLE RV Basal diam:  2.60 cm RV S prime:     10.10 cm/s TAPSE (M-mode): 2.2 cm LEFT ATRIUM             Index        RIGHT ATRIUM           Index LA diam:        3.00 cm 1.42 cm/m   RA Area:     10.10 cm LA Vol (A2C):   36.6 ml 17.28 ml/m  RA Volume:   18.40 ml  8.69 ml/m LA Vol (A4C):   47.8 ml 22.57 ml/m LA Biplane Vol: 45.3 ml 21.39 ml/m  AORTIC VALVE AV Area (Vmax):    2.77 cm AV Area (Vmean):   2.64 cm AV Area (VTI):     2.85 cm AV Vmax:           116.00 cm/s AV Vmean:          81.100 cm/s AV VTI:            0.216 m AV Peak Grad:      5.4 mmHg AV Mean Grad:      3.0 mmHg LVOT Vmax:         77.40 cm/s LVOT Vmean:        51.500 cm/s LVOT VTI:          0.148 m LVOT/AV VTI ratio: 0.69  AORTA Ao Root diam: 3.50 cm Ao Asc diam:  3.60 cm MITRAL VALVE MV Area VTI:  2.62 cm        SHUNTS MV Peak grad: 5.6 mmHg        Systemic VTI:  0.15 m MV Mean grad: 3.0 mmHg        Systemic Diam: 2.30 cm MV Vmax:      1.18 m/s MV Vmean:     78.5  cm/s MR Peak grad:    94.9 mmHg MR Mean grad:    60.0 mmHg MR Vmax:         487.00 cm/s MR Vmean:        375.0 cm/s MR PISA:         1.01 cm MR PISA Eff ROA: 8 mm MR PISA Radius:  0.40 cm MV E velocity: 63.90 cm/s MV A velocity: 120.00 cm/s MV E/A ratio:  0.53 Maciej Schweitzer D Holliday Sheaffer MD Electronically signed by Yolonda Kida MD Signature Date/Time: 01/30/2023/2:42:49 PM    Final    CARDIAC CATHETERIZATION  Result Date: 01/30/2023   Mid LAD lesion is 90% stenosed.   A stent was successfully placed.   Post intervention, there is a 0% residual stenosis. 1.  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 2.  Elevated LVEDP of 27 mmHg with ejection fraction of approximately  30 to 35% with anterior apical wall motion abnormality. 3.  Difficulty advancing 6 [place] guides up the right upper extremity; the procedure was performed with a 5 [place] guide. Recommendation: The patient was loaded with 180 mg of ticagrelor; given requirement for indefinite anticoagulation due to a history of multiple prior DVTs, will load with 600 mg of Plavix tomorrow and continue triple therapy for 1 month followed by Plavix and anticoagulation for 1 year.  Consider changing to [place] in house.  Will obtain echocardiogram, start high-dose atorvastatin, and beta-blocker.  The results were reviewed with the patient's family.   CT Angio Chest/Abd/Pel for Dissection W and/or W/WO  Result Date: 01/30/2023 CLINICAL DATA:  Chest pain radiating to the left arm EXAM: CT ANGIOGRAPHY CHEST, ABDOMEN AND PELVIS TECHNIQUE: Non-contrast CT of the chest was initially obtained. Multidetector CT imaging through the chest, abdomen and pelvis was performed using the standard protocol during bolus administration of intravenous contrast. Multiplanar reconstructed images and MIPs were obtained and reviewed to evaluate the vascular anatomy. RADIATION DOSE REDUCTION: This exam was performed according to the departmental  dose-optimization program which includes automated exposure control, adjustment of the mA and/or kV according to patient size and/or use of iterative reconstruction technique. CONTRAST:  162mL OMNIPAQUE IOHEXOL 350 MG/ML SOLN COMPARISON:  04/25/2022 FINDINGS: CTA CHEST FINDINGS Cardiovascular: --Heart: The heart size is normal.  There is nopericardial effusion. --Aorta: The course and caliber of the thoracic aorta are normal. There is aortic atherosclerotic calcification. Precontrast images show no aortic intramural hematoma. There is no blood pool, dissection or penetrating ulcer demonstrated on arterial phase postcontrast imaging. There is a conventional 3 vessel aortic arch branching pattern. The proximal arch vessels are widely patent. --Pulmonary Arteries: Contrast timing is optimized for preferential opacification of the aorta. Within that limitation, normal central pulmonary arteries. Mediastinum/Nodes: No mediastinal, hilar or axillary lymphadenopathy. The visualized thyroid and thoracic esophageal course are unremarkable. Lungs/Pleura: No pulmonary nodules or masses. No pleural effusion or pneumothorax. No focal airspace consolidation. No focal pleural abnormality. Musculoskeletal: No chest wall abnormality. No acute osseous findings. Review of the MIP images confirms the above findings. CTA ABDOMEN AND PELVIS FINDINGS VASCULAR Aorta: Normal caliber aorta without aneurysm, dissection, vasculitis or hemodynamically significant stenosis. There is calcific aortic atherosclerosis. Celiac: No aneurysm, dissection or hemodynamically significant stenosis. Normal branching pattern. SMA: Widely patent without dissection or stenosis. Renals: 2 right and 1 left renal arteries. No aneurysm, dissection, stenosis or evidence of fibromuscular dysplasia. IMA: Patent without abnormality. Inflow: No aneurysm, stenosis or dissection. Veins: Normal course and caliber of the major veins. Assessment is otherwise limited by the  arterial dominant contrast phase. Review of the MIP images confirms the above findings. NON-VASCULAR Hepatobiliary: Normal hepatic contours and density. No visible biliary dilatation. Normal gallbladder. Pancreas: Normal contours without ductal dilatation. No peripancreatic fluid collection. Spleen: Normal arterial phase splenic enhancement pattern. Adrenals/Urinary Tract: --Adrenal glands: Normal. --Right kidney/ureter: No hydronephrosis or perinephric stranding. No nephrolithiasis. No obstructing ureteral stones. --Left kidney/ureter: No hydronephrosis or perinephric stranding. No nephrolithiasis. No obstructing ureteral stones. --Urinary bladder: Unremarkable. Stomach/Bowel: --Stomach/Duodenum: No hiatal hernia or other gastric abnormality. Normal duodenal course and caliber. --Small bowel: No dilatation or inflammation. --Colon: Rectosigmoid diverticulosis without acute inflammation. --Appendix: Not visualized. No right lower quadrant inflammation or free fluid. Lymphatic:  No abdominal or pelvic lymphadenopathy. Reproductive: Normal prostate and seminal vesicles. Musculoskeletal. No bony spinal canal stenosis or focal osseous abnormality. Right hip arthroplasty. Other: None. Review of the MIP images confirms the above findings.  IMPRESSION: 1. No acute aortic syndrome. 2. No acute abnormality of the chest, abdomen or pelvis. 3. Rectosigmoid diverticulosis without acute inflammation. Aortic Atherosclerosis (ICD10-I70.0). Electronically Signed   By: Ulyses Jarred M.D.   On: 01/30/2023 01:00     Echo mild to moderately depressed left ventricular function EF around 35 to 40% severe anterior apical septal akinesis  TELEMETRY: Normal sinus rhythm rate of 70 nonspecific ST-T wave changes:  ASSESSMENT AND PLAN:  Principal Problem:   NSTEMI (non-ST elevated myocardial infarction) ([place]) Active Problems:   History of DVT (deep vein thrombosis)   History of prostate cancer   Hypertension   S/P cardiac  catheterization   Plan Status post PCI and stent non-STEMI 8 DES overlapping stents to LAD with good results patient doing reasonably well recommend continue long-term aspirin Plavix for 1 month and resume anticoagulation with Eliquis for 1 year 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

## 2023-01-31 NOTE — Progress Notes (Signed)
   01/31/23 1030  Spiritual Encounters  Type of Visit Initial  Care provided to: Pt and family  Referral source Chaplain team  Reason for visit Routine spiritual support  OnCall Visit Yes   Chaplain responded to follow up request from previous on-call chaplain to provided support to patient and family. Chaplain found patient sitting up in chair and in good spirits, eager to go home. Patient and wife expressed appreciation to all involved in their care saying that everything has been exceptional.

## 2023-01-31 NOTE — Progress Notes (Signed)
Cardiac Meds on D/C  Lipitor 80 QD Coreg 6.25 bid Plavix 75 daily Lisinopril 5 mg QD Coumadin.  F/U with cardiology 1-2 weeks   Thanks

## 2023-01-31 NOTE — Progress Notes (Signed)
ANTICOAGULATION CONSULT NOTE - Initial Consult  Pharmacy Consult for Warfarin  Indication: VTE treatment   Allergies  Allergen Reactions   Rosuvastatin Other (See Comments)   Codeine Nausea Only   Sulfa Antibiotics Nausea And Vomiting    Patient Measurements: Height: 5\' 10"  (177.8 cm) Weight: 93.9 kg (207 lb) IBW/kg (Calculated) : 73 Heparin Dosing Weight:   Vital Signs: Temp: 98.1 F (36.7 C) (03/17 0500) Temp Source: Oral (03/17 0500) BP: 99/48 (03/17 0800) Pulse Rate: 61 (03/17 0800)  Labs: Recent Labs    01/29/23 2347 01/30/23 0124 01/30/23 0441 01/30/23 0616 01/31/23 0449  HGB 13.6  --  12.6*  --  12.3*  HCT 42.3  --  38.4*  --  38.4*  PLT 239  --  214  --  215  APTT 44*  --   --   --   --   LABPROT 21.9*  --   --  24.1* 25.7*  INR 1.9*  --   --  2.2* 2.4*  CREATININE 1.01  --  0.97  --  1.06  TROPONINIHS 312* 397*  --   --   --      Estimated Creatinine Clearance: 67.2 mL/min (by C-G formula based on SCr of 1.06 mg/dL).   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   prn at prn   Cyanocobalamin 2500 MCG SUBL Take 2,500 mcg by mouth daily. Takes at 11 am   01/29/2023   Glucosamine-Chondroitin (OSTEO BI-FLEX REGULAR STRENGTH PO) Take 2 tablets by mouth daily. Takes at 11 am   01/29/2023   ipratropium (ATROVENT) 0.03 % nasal spray  Place 1 spray into the nose daily.   01/29/2023   pravastatin (PRAVACHOL) 10 MG tablet Take 10 mg by mouth daily.   01/29/2023   tamsulosin (FLOMAX) 0.4 MG CAPS capsule Take 0.4 mg by mouth daily with lunch.   01/29/2023   triamterene-hydrochlorothiazide (MAXZIDE-25) 37.5-25 MG tablet Take 0.5 tablets by mouth daily with lunch. Takes at 11 am   01/29/2023   warfarin (COUMADIN) 3 MG tablet Take 3 mg by mouth as directed. Takes on Mondays, Tuesday, Wednesdays, Thursday and Fridays   01/29/2023   warfarin (COUMADIN) 4 MG tablet Take 4 mg by mouth as directed. Takes on Sat and Sun.   01/29/2023   ondansetron (ZOFRAN-ODT) 4 MG disintegrating tablet Take 1 tablet (4 mg total) by mouth every 8 (eight) hours as needed for nausea or vomiting. (Patient not taking: Reported on 01/30/2023) 20 tablet 0 Not Taking   oxyCODONE-acetaminophen (PERCOCET) 5-325 MG tablet Take 1 tablet by mouth every 4 (four) hours as needed. (Patient not taking: Reported on 01/30/2023) 12 tablet 0 Not Taking   phosphorus (K PHOS NEUTRAL) 155-852-130 MG tablet  Take 1 tablet (250 mg total) by mouth 3 (three) times daily. (Patient not taking: Reported on 01/30/2023) 6 tablet 0 Not Taking   rosuvastatin (CRESTOR) 5 MG tablet Take 5 mg by mouth daily with lunch. (Patient not taking: Reported on 01/30/2023)   Not Taking    Assessment: Pharmacy consulted to dose warfarin for VTE treatment in this 77 year old admitted with NSTEMI, S/P cardiac cath.   Pt was on warfarin 3 mg PO M,Tue,Wed,Thur, Fri and Warfarin 4 mg PO every Sat and Sun.   Last dose on 3/15.   3/16:  INR = 2.2  3/17:  INR = 2.4   Goal of Therapy:  INR 2-3   Plan:  3/17:  INR = 2.4, therapeutic  Will continue home warfarin dose and check INR on 3/17 with AM labs.   Warfarin 3 mg PO Q Mon-Tue-Wed-Thurs-Fri ordered and Warfarin 4 mg PO Q Sat - Sun    Finlee Milo A Jayzon Taras 01/31/2023,9:15 AM

## 2023-01-31 NOTE — Discharge Summary (Signed)
Physician Discharge Summary   Patient: [name] [name] MRN: [id number] DOB: Apr 13, 1946  Admit date:     01/29/2023  Discharge date: 01/31/23  Discharge Physician: Oran Rein   PCP: Adin Hector, MD   Recommendations at discharge:  {Tip this will not be part of the note when signed- Example include specific recommendations for outpatient follow-up, pending tests to follow-up on. (Optional):[id number]}    Discharge Diagnoses: Principal Problem:   NSTEMI (non-ST elevated myocardial infarction) ([place]) Active Problems:   S/P cardiac catheterization   Hypertension   History of DVT (deep vein thrombosis)   History of prostate cancer  Resolved Problems:   * No resolved hospital problems. [facility] Course: No notes on file  Assessment and Plan: * NSTEMI (non-ST elevated myocardial infarction) [facility]) S/p cardiac cath DES to LAD Orders per cardiology Cardiology consulted to follow  Hypertension Continue carvedilol.  Hold Dyazide  History of DVT (deep vein thrombosis) Continue Coumadin.  Pharmacy consulted to manage  History of prostate cancer No acute issues suspected    {Tip this will not be part of the note when signed Body mass index is 29.7 kg/m. , ,  (Optional):26781}  Pain control - [place] Controlled Substance Reporting System database was reviewed. and patient was instructed, not to drive, operate heavy machinery, perform activities at heights, swimming or participation in water activities or provide baby-sitting services while on Pain, Sleep and Anxiety Medications; until their outpatient Physician has advised to do so again. Also recommended to not to take more than prescribed Pain, Sleep and Anxiety Medications.  Consultants: Cardiology  Procedures performed: Cardiac cath  Disposition: Home Diet recommendation:  Discharge Diet Orders (From admission, onward)     Start     Ordered   01/31/23 0000  Diet - low sodium heart healthy        01/31/23 1134            Cardiac diet DISCHARGE MEDICATION: Allergies as of 01/31/2023       Reactions   Rosuvastatin Other (See Comments)   Codeine Nausea Only   Sulfa Antibiotics Nausea And Vomiting        Medication List     STOP taking these medications    rosuvastatin 5 MG tablet Commonly known as: CRESTOR       TAKE these medications    acetaminophen 500 MG tablet Commonly known as: TYLENOL Take 1,000 mg by mouth at bedtime as needed.   aspirin 81 MG chewable tablet Chew 1 tablet (81 mg total) by mouth daily. Start taking on: February 01, 2023   atorvastatin 80 MG tablet Commonly known as: LIPITOR Take 1 tablet (80 mg total) by mouth daily for 60 doses. Start taking on: February 01, 2023   carvedilol 6.25 MG tablet Commonly known as: COREG Take 1 tablet (6.25 mg total) by mouth 2 (two) times daily with a meal.   clopidogrel 75 MG tablet Commonly known as: PLAVIX Take 1 tablet (75 mg total) by mouth daily with breakfast. Start taking on: February 01, 2023   Cyanocobalamin 2500 MCG Subl Take 2,500 mcg by mouth daily. Takes at 11 am   ipratropium 0.03 % nasal spray Commonly known as: ATROVENT Place 1 spray into the nose daily.   lisinopril 2.5 MG tablet Commonly known as: ZESTRIL Take 2 tablets (5 mg total) by mouth daily for 60 doses. Start taking on: February 01, 2023   nitroGLYCERIN 0.4 MG SL tablet Commonly known as: NITROSTAT Place 1 tablet (  0.4 mg total) under the tongue every 5 (five) minutes as needed for up to 1 dose for chest pain.   ondansetron 4 MG disintegrating tablet Commonly known as: ZOFRAN-ODT Take 1 tablet (4 mg total) by mouth every 8 (eight) hours as needed for nausea or vomiting.   OSTEO BI-FLEX REGULAR STRENGTH PO Take 2 tablets by mouth daily. Takes at 11 am   oxyCODONE-acetaminophen 5-325 MG tablet Commonly known as: Percocet Take 1 tablet by mouth every 4 (four) hours as needed.   phosphorus 155-852-130 MG tablet Commonly known as: K PHOS  NEUTRAL Take 1 tablet (250 mg total) by mouth 3 (three) times daily.   pravastatin 10 MG tablet Commonly known as: PRAVACHOL Take 10 mg by mouth daily.   spironolactone 25 MG tablet Commonly known as: ALDACTONE Take 0.5 tablets (12.5 mg total) by mouth daily. Start taking on: February 01, 2023   tamsulosin 0.4 MG Caps capsule Commonly known as: FLOMAX Take 0.4 mg by mouth daily with lunch.   triamterene-hydrochlorothiazide 37.5-25 MG tablet Commonly known as: MAXZIDE-25 Take 0.5 tablets by mouth daily with lunch. Takes at 11 am   warfarin 4 MG tablet Commonly known as: COUMADIN Take 4 mg by mouth as directed. Takes on Sat and Sun.   warfarin 3 MG tablet Commonly known as: COUMADIN Take 3 mg by mouth as directed. Takes on Mondays, Tuesday, Wednesdays, Thursday and Fridays        Discharge Exam: Danley Danker Weights   01/29/23 2328  Weight: 93.9 kg   ***  Condition at discharge: good  The results of significant diagnostics from this hospitalization (including imaging, microbiology, ancillary and laboratory) are listed below for reference.   Imaging Studies: ECHOCARDIOGRAM COMPLETE  Result Date: 01/30/2023    ECHOCARDIOGRAM REPORT   Patient Name:   [name] [name] Berte Date of Exam: 01/30/2023 Medical Rec #:  [id number]  Height:       70.0 in Accession #:    [id number] Weight:       207.0 lb Date of Birth:  [birth year]  BSA:          2.118 m Patient Age:    77 years   BP:           130/66 mmHg Patient Gender: M          HR:           82 bpm. Exam Location:  [facility] Procedure: 2D Echo, Color Doppler, Cardiac Doppler and Intracardiac            Opacification Agent Indications:     Acute Myocardial Infarction I21.9  History:         Patient has no prior history of Echocardiogram examinations.                  Risk Factors:Hypertension and Hyperlipidemia.  Sonographer:     L. Thornton-Maynard Referring Phys:  [id number] Early Osm[place] Diagnosing Phys: Yolonda Kida MD IMPRESSIONS  1. Large area of  ant/apical/septal Akinesis.  2. Ant/apical/septal Akinesis. Left ventricular ejection fraction, by estimation, is 35 to 40%. The left ventricle has moderately decreased function. The left ventricle demonstrates regional wall motion abnormalities (see scoring diagram/findings for description). The left ventricular internal cavity size was mildly dilated. There is mild concentric left ventricular hypertrophy. Left ventricular diastolic parameters are consistent with Grade I diastolic dysfunction (impaired relaxation).  3. Right ventricular systolic function is normal. The right ventricular size is normal.  4. The mitral valve is normal in  structure. Moderate to severe mitral valve regurgitation.  5. The aortic valve is normal in structure. There is mild calcification of the aortic valve. There is mild thickening of the aortic valve. Aortic valve regurgitation is not visualized. Aortic valve sclerosis is present, with no evidence of aortic valve stenosis. FINDINGS  Left Ventricle: Ant/apical/septal Akinesis. Left ventricular ejection fraction, by estimation, is 35 to 40%. The left ventricle has moderately decreased function. The left ventricle demonstrates regional wall motion abnormalities. Definity contrast agent was given IV to delineate the left ventricular endocardial borders. The left ventricular internal cavity size was mildly dilated. There is mild concentric left ventricular hypertrophy. Abnormal (paradoxical) septal motion, consistent with left bundle branch block. Left ventricular diastolic parameters are consistent with Grade I diastolic dysfunction (impaired relaxation). Right Ventricle: The right ventricular size is normal. No increase in right ventricular wall thickness. Right ventricular systolic function is normal. Left Atrium: Left atrial size was normal in size. Right Atrium: Right atrial size was normal in size. Pericardium: There is no evidence of pericardial effusion. Mitral Valve: The mitral  valve is normal in structure. Moderate to severe mitral valve regurgitation. MV peak gradient, 5.6 mmHg. The mean mitral valve gradient is 3.0 mmHg. Tricuspid Valve: The tricuspid valve is normal in structure. Tricuspid valve regurgitation is mild. Aortic Valve: The aortic valve is normal in structure. There is mild calcification of the aortic valve. There is mild thickening of the aortic valve. There is mild aortic valve annular calcification. Aortic valve regurgitation is not visualized. Aortic valve sclerosis is present, with no evidence of aortic valve stenosis. Aortic valve mean gradient measures 3.0 mmHg. Aortic valve peak gradient measures 5.4 mmHg. Aortic valve area, by VTI measures 2.85 cm. Pulmonic Valve: The pulmonic valve was normal in structure. Pulmonic valve regurgitation is not visualized. Aorta: The ascending aorta was not well visualized. IAS/Shunts: No atrial level shunt detected by color flow Doppler. Additional Comments: Large area of ant/apical/septal Akinesis.  LEFT VENTRICLE PLAX 2D LVIDd:         4.40 cm      Diastology LVIDs:         3.50 cm      LV e' medial:    5.87 cm/s LV PW:         1.10 cm      LV E/e' medial:  10.9 LV IVS:        1.30 cm      LV e' lateral:   12.20 cm/s LVOT diam:     2.30 cm      LV E/e' lateral: 5.2 LV SV:         61 LV SV Index:   29 LVOT Area:     4.15 cm  LV Volumes (MOD) LV vol d, MOD A2C: 74.0 ml LV vol d, MOD A4C: 106.0 ml LV vol s, MOD A2C: 45.9 ml LV vol s, MOD A4C: 60.7 ml LV SV MOD A2C:     28.1 ml LV SV MOD A4C:     106.0 ml LV SV MOD BP:      37.1 ml RIGHT VENTRICLE RV Basal diam:  2.60 cm RV S prime:     10.10 cm/s TAPSE (M-mode): 2.2 cm LEFT ATRIUM             Index        RIGHT ATRIUM           Index LA diam:        3.00 cm 1.42 cm/m  RA Area:     10.10 cm LA Vol (A2C):   36.6 ml 17.28 ml/m  RA Volume:   18.40 ml  8.69 ml/m LA Vol (A4C):   47.8 ml 22.57 ml/m LA Biplane Vol: 45.3 ml 21.39 ml/m  AORTIC VALVE AV Area (Vmax):    2.77 cm AV Area  (Vmean):   2.64 cm AV Area (VTI):     2.85 cm AV Vmax:           116.00 cm/s AV Vmean:          81.100 cm/s AV VTI:            0.216 m AV Peak Grad:      5.4 mmHg AV Mean Grad:      3.0 mmHg LVOT Vmax:         77.40 cm/s LVOT Vmean:        51.500 cm/s LVOT VTI:          0.148 m LVOT/AV VTI ratio: 0.69  AORTA Ao Root diam: 3.50 cm Ao Asc diam:  3.60 cm MITRAL VALVE MV Area VTI:  2.62 cm        SHUNTS MV Peak grad: 5.6 mmHg        Systemic VTI:  0.15 m MV Mean grad: 3.0 mmHg        Systemic Diam: 2.30 cm MV Vmax:      1.18 m/s MV Vmean:     78.5 cm/s MR Peak grad:    94.9 mmHg MR Mean grad:    60.0 mmHg MR Vmax:         487.00 cm/s MR Vmean:        375.0 cm/s MR PISA:         1.01 cm MR PISA Eff ROA: 8 mm MR PISA Radius:  0.40 cm MV E velocity: 63.90 cm/s MV A velocity: 120.00 cm/s MV E/A ratio:  0.53 Dwayne D Callwood MD Electronically signed by Yolonda Kida MD Signature Date/Time: 01/30/2023/2:42:49 PM    Final    CARDIAC CATHETERIZATION  Result Date: 01/30/2023   Mid LAD lesion is 90% stenosed.   A stent was successfully placed.   Post intervention, there is a 0% residual stenosis. 1.  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 2.  Elevated LVEDP of 27 mmHg with ejection fraction of approximately 30 to 35% with anterior apical wall motion abnormality. 3.  Difficulty advancing 6 [place] guides up the right upper extremity; the procedure was performed with a 5 [place] guide. Recommendation: The patient was loaded with 180 mg of ticagrelor; given requirement for indefinite anticoagulation due to a history of multiple prior DVTs, will load with 600 mg of Plavix tomorrow and continue triple therapy for 1 month followed by Plavix and anticoagulation for 1 year.  Consider changing to [place] in house.  Will obtain echocardiogram, start high-dose atorvastatin, and beta-blocker.  The results were reviewed with the patient's family.   CT Angio Chest/Abd/Pel for  Dissection W and/or W/WO  Result Date: 01/30/2023 CLINICAL DATA:  Chest pain radiating to the left arm EXAM: CT ANGIOGRAPHY CHEST, ABDOMEN AND PELVIS TECHNIQUE: Non-contrast CT of the chest was initially obtained. Multidetector CT imaging through the chest, abdomen and pelvis was performed using the standard protocol during bolus administration of intravenous contrast. Multiplanar reconstructed images and MIPs were obtained and reviewed to evaluate the vascular anatomy. RADIATION DOSE REDUCTION: This exam was performed according to the departmental dose-optimization program  which includes automated exposure control, adjustment of the mA and/or kV according to patient size and/or use of iterative reconstruction technique. CONTRAST:  176mL OMNIPAQUE IOHEXOL 350 MG/ML SOLN COMPARISON:  04/25/2022 FINDINGS: CTA CHEST FINDINGS Cardiovascular: --Heart: The heart size is normal.  There is nopericardial effusion. --Aorta: The course and caliber of the thoracic aorta are normal. There is aortic atherosclerotic calcification. Precontrast images show no aortic intramural hematoma. There is no blood pool, dissection or penetrating ulcer demonstrated on arterial phase postcontrast imaging. There is a conventional 3 vessel aortic arch branching pattern. The proximal arch vessels are widely patent. --Pulmonary Arteries: Contrast timing is optimized for preferential opacification of the aorta. Within that limitation, normal central pulmonary arteries. Mediastinum/Nodes: No mediastinal, hilar or axillary lymphadenopathy. The visualized thyroid and thoracic esophageal course are unremarkable. Lungs/Pleura: No pulmonary nodules or masses. No pleural effusion or pneumothorax. No focal airspace consolidation. No focal pleural abnormality. Musculoskeletal: No chest wall abnormality. No acute osseous findings. Review of the MIP images confirms the above findings. CTA ABDOMEN AND PELVIS FINDINGS VASCULAR Aorta: Normal caliber aorta  without aneurysm, dissection, vasculitis or hemodynamically significant stenosis. There is calcific aortic atherosclerosis. Celiac: No aneurysm, dissection or hemodynamically significant stenosis. Normal branching pattern. SMA: Widely patent without dissection or stenosis. Renals: 2 right and 1 left renal arteries. No aneurysm, dissection, stenosis or evidence of fibromuscular dysplasia. IMA: Patent without abnormality. Inflow: No aneurysm, stenosis or dissection. Veins: Normal course and caliber of the major veins. Assessment is otherwise limited by the arterial dominant contrast phase. Review of the MIP images confirms the above findings. NON-VASCULAR Hepatobiliary: Normal hepatic contours and density. No visible biliary dilatation. Normal gallbladder. Pancreas: Normal contours without ductal dilatation. No peripancreatic fluid collection. Spleen: Normal arterial phase splenic enhancement pattern. Adrenals/Urinary Tract: --Adrenal glands: Normal. --Right kidney/ureter: No hydronephrosis or perinephric stranding. No nephrolithiasis. No obstructing ureteral stones. --Left kidney/ureter: No hydronephrosis or perinephric stranding. No nephrolithiasis. No obstructing ureteral stones. --Urinary bladder: Unremarkable. Stomach/Bowel: --Stomach/Duodenum: No hiatal hernia or other gastric abnormality. Normal duodenal course and caliber. --Small bowel: No dilatation or inflammation. --Colon: Rectosigmoid diverticulosis without acute inflammation. --Appendix: Not visualized. No right lower quadrant inflammation or free fluid. Lymphatic:  No abdominal or pelvic lymphadenopathy. Reproductive: Normal prostate and seminal vesicles. Musculoskeletal. No bony spinal canal stenosis or focal osseous abnormality. Right hip arthroplasty. Other: None. Review of the MIP images confirms the above findings. IMPRESSION: 1. No acute aortic syndrome. 2. No acute abnormality of the chest, abdomen or pelvis. 3. Rectosigmoid diverticulosis  without acute inflammation. Aortic Atherosclerosis (ICD10-I70.0). Electronically Signed   By: Ulyses Jarred M.D.   On: 01/30/2023 01:00    Microbiology: Results for orders placed or performed during the hospital encounter of 04/25/22  Resp Panel by RT-PCR (Flu A&B, Covid) Anterior Nasal Swab     Status: None   Collection Time: 04/25/22  5:11 AM   Specimen: Anterior Nasal Swab  Result Value Ref Range Status   SARS Coronavirus 2 by RT PCR NEGATIVE NEGATIVE Final    Comment: (NOTE) SARS-CoV-2 target nucleic acids are NOT DETECTED.  The SARS-CoV-2 RNA is generally detectable in upper respiratory specimens during the acute phase of infection. The lowest concentration of SARS-CoV-2 viral copies this assay can detect is 138 copies/mL. A negative result does not preclude SARS-Cov-2 infection and should not be used as the sole basis for treatment or other patient management decisions. A negative result may occur with  improper specimen collection/handling, submission of specimen other than nasopharyngeal swab, presence of  viral mutation(s) within the areas targeted by this assay, and inadequate number of viral copies(<138 copies/mL). A negative result must be combined with clinical observations, patient history, and epidemiological information. The expected result is Negative.  Fact Sheet for Patients:  EntrepreneurPulse.com.au  Fact Sheet for Healthcare Providers:  IncredibleEmployment.be  This test is no t yet approved or cleared by the [place] FDA and  has been authorized for detection and/or diagnosis of SARS-CoV-2 by FDA under an Emergency Use Authorization (EUA). This EUA will remain  in effect (meaning this test can be used) for the duration of the COVID-19 declaration under Section 564(b)(1) of the Act, 21 U.S.C.section 360bbb-3(b)(1), unless the authorization is terminated  or revoked sooner.       Influenza A by PCR NEGATIVE NEGATIVE  Final   Influenza B by PCR NEGATIVE NEGATIVE Final    Comment: (NOTE) The Xpert Xpress SARS-CoV-2/FLU/RSV plus assay is intended as an aid in the diagnosis of influenza from Nasopharyngeal swab specimens and should not be used as a sole basis for treatment. Nasal washings and aspirates are unacceptable for Xpert Xpress SARS-CoV-2/FLU/RSV testing.  Fact Sheet for Patients: EntrepreneurPulse.com.au  Fact Sheet for Healthcare Providers: IncredibleEmployment.be  This test is not yet approved or cleared by the [place] FDA and has been authorized for detection and/or diagnosis of SARS-CoV-2 by FDA under an Emergency Use Authorization (EUA). This EUA will remain in effect (meaning this test can be used) for the duration of the COVID-19 declaration under Section 564(b)(1) of the Act, 21 U.S.C. section 360bbb-3(b)(1), unless the authorization is terminated or revoked.  Performed at [facility], [address] [place], [place] [address]   Culture, blood (Routine x 2)     Status: Abnormal   Collection Time: 04/25/22  5:12 AM   Specimen: BLOOD  Result Value Ref Range Status   Specimen Description   Final    BLOOD RIGHT ANTECUBITAL Performed at [facility], [address], [place], [place] [address]    Special Requests   Final    BOTTLES DRAWN AEROBIC AND ANAEROBIC Blood Culture adequate volume Performed at [facility], [address], [place], [place] [address]    Culture  Setup Time   Final    GRAM NEGATIVE RODS ANAEROBIC BOTTLE ONLY CRITICAL RESULT CALLED TO, READ BACK BY AND VERIFIED WITH: Lu Duffel 04/28/22 0740 MW Performed at [facility], [place] [address], [place],  [address]    Culture PSEUDOMONAS AERUGINOSA (A)  Final   Report Status 04/30/2022 FINAL  Final   Organism ID, Bacteria PSEUDOMONAS AERUGINOSA  Final      Susceptibility   Pseudomonas aeruginosa - MIC*    CEFTAZIDIME 2  SENSITIVE Sensitive     CIPROFLOXACIN <=0.25 SENSITIVE Sensitive     GENTAMICIN <=1 SENSITIVE Sensitive     IMIPENEM <=0.25 SENSITIVE Sensitive     PIP/TAZO <=4 SENSITIVE Sensitive     CEFEPIME 2 SENSITIVE Sensitive     * PSEUDOMONAS AERUGINOSA  Blood Culture ID Panel (Reflexed)     Status: Abnormal   Collection Time: 04/25/22  5:12 AM  Result Value Ref Range Status   Enterococcus faecalis NOT DETECTED NOT DETECTED Final   Enterococcus Faecium NOT DETECTED NOT DETECTED Final   Listeria monocytogenes NOT DETECTED NOT DETECTED Final   Staphylococcus species NOT DETECTED NOT DETECTED Final   Staphylococcus aureus (BCID) NOT DETECTED NOT DETECTED Final   Staphylococcus epidermidis NOT DETECTED NOT DETECTED Final   Staphylococcus lugdunensis NOT  DETECTED NOT DETECTED Final   Streptococcus species NOT DETECTED NOT DETECTED Final   Streptococcus agalactiae NOT DETECTED NOT DETECTED Final   Streptococcus pneumoniae NOT DETECTED NOT DETECTED Final   Streptococcus pyogenes NOT DETECTED NOT DETECTED Final   A.calcoaceticus-baumannii NOT DETECTED NOT DETECTED Final   Bacteroides fragilis NOT DETECTED NOT DETECTED Final   Enterobacterales NOT DETECTED NOT DETECTED Final   Enterobacter cloacae complex NOT DETECTED NOT DETECTED Final   Escherichia coli NOT DETECTED NOT DETECTED Final   Klebsiella aerogenes NOT DETECTED NOT DETECTED Final   Klebsiella oxytoca NOT DETECTED NOT DETECTED Final   Klebsiella pneumoniae NOT DETECTED NOT DETECTED Final   Proteus species NOT DETECTED NOT DETECTED Final   Salmonella species NOT DETECTED NOT DETECTED Final   Serratia marcescens NOT DETECTED NOT DETECTED Final   Haemophilus influenzae NOT DETECTED NOT DETECTED Final   Neisseria meningitidis NOT DETECTED NOT DETECTED Final   Pseudomonas aeruginosa DETECTED (A) NOT DETECTED Final    Comment: CRITICAL RESULT CALLED TO, READ BACK BY AND VERIFIED WITH: DEVAN MITCHELL 04/28/22 0740 MW    Stenotrophomonas  maltophilia NOT DETECTED NOT DETECTED Final   Candida albicans NOT DETECTED NOT DETECTED Final   Candida auris NOT DETECTED NOT DETECTED Final   Candida glabrata NOT DETECTED NOT DETECTED Final   Candida krusei NOT DETECTED NOT DETECTED Final   Candida parapsilosis NOT DETECTED NOT DETECTED Final   Candida tropicalis NOT DETECTED NOT DETECTED Final   Cryptococcus neoformans/gattii NOT DETECTED NOT DETECTED Final   CTX-M ESBL NOT DETECTED NOT DETECTED Final   Carbapenem resistance IMP NOT DETECTED NOT DETECTED Final   Carbapenem resistance KPC NOT DETECTED NOT DETECTED Final   Carbapenem resistance NDM NOT DETECTED NOT DETECTED Final   Carbapenem resistance VIM NOT DETECTED NOT DETECTED Final    Comment: Performed at [facility], [address] [place], [place] [address]  Urine Culture     Status: Abnormal   Collection Time: 04/25/22  5:13 AM   Specimen: In/Out Cath Urine  Result Value Ref Range Status   Specimen Description   Final    IN/OUT CATH URINE Performed at [facility], [address] [place], [place] [address]    Special Requests   Final    NONE Performed at [facility], [address] [place], [place] [address]    Culture >=100,000 COLONIES/mL PSEUDOMONAS AERUGINOSA (A)  Final   Report Status 04/27/2022 FINAL  Final   Organism ID, Bacteria PSEUDOMONAS AERUGINOSA (A)  Final      Susceptibility   Pseudomonas aeruginosa - MIC*    CEFTAZIDIME 2 SENSITIVE Sensitive     CIPROFLOXACIN <=0.25 SENSITIVE Sensitive     GENTAMICIN <=1 SENSITIVE Sensitive     IMIPENEM 1 SENSITIVE Sensitive     PIP/TAZO <=4 SENSITIVE Sensitive     CEFEPIME 2 SENSITIVE Sensitive     * >=100,000 COLONIES/mL PSEUDOMONAS AERUGINOSA  Culture, blood (Routine x 2)     Status: None   Collection Time: 04/25/22  5:57 AM   Specimen: BLOOD  Result Value Ref Range Status   Specimen Description BLOOD BLOOD RIGHT FOREARM  Final   Special Requests   Final    BOTTLES DRAWN  AEROBIC AND ANAEROBIC Blood Culture results may not be optimal due to an excessive volume of blood received in culture bottles   Culture   Final    NO GROWTH 5 DAYS Performed at [facility], [address], [place], [place] [address]  Report Status 04/30/2022 FINAL  Final    Labs: CBC: Recent Labs  Lab 01/29/23 2347 01/30/23 0441 01/31/23 0449  WBC 7.0 10.4 7.5  NEUTROABS 3.0  --   --   HGB 13.6 12.6* 12.3*  HCT 42.3 38.4* 38.4*  MCV 92.8 91.2 92.5  PLT 239 214 [id number]   Basic Metabolic Panel: Recent Labs  Lab 01/29/23 2347 01/30/23 0441 01/31/23 0449  NA 139 137 137  K 3.4* 3.7 3.6  CL 103 105 108  CO2 25 24 24   GLUCOSE 105* 126* 102*  BUN 23 21 18   CREATININE 1.01 0.97 1.06  CALCIUM 9.4 8.6* 8.2*  MG  --   --  2.1   Liver Function Tests: Recent Labs  Lab 01/29/23 2347  AST 23  ALT 14  ALKPHOS 79  BILITOT 0.6  PROT 7.5  ALBUMIN 4.3   CBG: Recent Labs  Lab 01/30/23 0344  GLUCAP 136*    Discharge time spent: greater than 30 minutes.  Signed: Oran Rein, MD [facility] 01/31/2023

## 2023-02-01 DIAGNOSIS — I2510 Atherosclerotic heart disease of native coronary artery without angina pectoris: Secondary | ICD-10-CM | POA: Insufficient documentation

## 2023-02-01 LAB — LIPOPROTEIN A (LPA): Lipoprotein (a): 126.1 nmol/L — ABNORMAL HIGH (ref <75.0)

## 2023-04-02 ENCOUNTER — Inpatient Hospital Stay: Payer: Medicare HMO

## 2023-04-02 ENCOUNTER — Inpatient Hospital Stay
Admission: EM | Admit: 2023-04-02 | Discharge: 2023-04-08 | DRG: 377 | Disposition: A | Discharge status: Home / Self Care | Payer: Medicare HMO | Attending: Obstetrics and Gynecology | Admitting: Obstetrics and Gynecology

## 2023-04-02 ENCOUNTER — Other Ambulatory Visit: Payer: Self-pay

## 2023-04-02 DIAGNOSIS — Z8546 Personal history of malignant neoplasm of prostate: Secondary | ICD-10-CM

## 2023-04-02 DIAGNOSIS — K922 Gastrointestinal hemorrhage, unspecified: Principal | ICD-10-CM

## 2023-04-02 DIAGNOSIS — Z8249 Family history of ischemic heart disease and other diseases of the circulatory system: Secondary | ICD-10-CM | POA: Diagnosis not present

## 2023-04-02 DIAGNOSIS — I252 Old myocardial infarction: Secondary | ICD-10-CM

## 2023-04-02 DIAGNOSIS — K5521 Angiodysplasia of colon with hemorrhage: Secondary | ICD-10-CM | POA: Diagnosis present

## 2023-04-02 DIAGNOSIS — K627 Radiation proctitis: Secondary | ICD-10-CM | POA: Diagnosis not present

## 2023-04-02 DIAGNOSIS — Z87891 Personal history of nicotine dependence: Secondary | ICD-10-CM

## 2023-04-02 DIAGNOSIS — Z96641 Presence of right artificial hip joint: Secondary | ICD-10-CM | POA: Diagnosis present

## 2023-04-02 DIAGNOSIS — Z955 Presence of coronary angioplasty implant and graft: Secondary | ICD-10-CM | POA: Diagnosis not present

## 2023-04-02 DIAGNOSIS — I10 Essential (primary) hypertension: Secondary | ICD-10-CM | POA: Diagnosis present

## 2023-04-02 DIAGNOSIS — E785 Hyperlipidemia, unspecified: Secondary | ICD-10-CM | POA: Diagnosis present

## 2023-04-02 DIAGNOSIS — R42 Dizziness and giddiness: Secondary | ICD-10-CM

## 2023-04-02 DIAGNOSIS — Z95828 Presence of other vascular implants and grafts: Secondary | ICD-10-CM

## 2023-04-02 DIAGNOSIS — I214 Non-ST elevation (NSTEMI) myocardial infarction: Secondary | ICD-10-CM | POA: Diagnosis present

## 2023-04-02 DIAGNOSIS — Z7982 Long term (current) use of aspirin: Secondary | ICD-10-CM | POA: Diagnosis not present

## 2023-04-02 DIAGNOSIS — Z86718 Personal history of other venous thrombosis and embolism: Secondary | ICD-10-CM

## 2023-04-02 DIAGNOSIS — I255 Ischemic cardiomyopathy: Secondary | ICD-10-CM | POA: Diagnosis present

## 2023-04-02 DIAGNOSIS — Z7901 Long term (current) use of anticoagulants: Secondary | ICD-10-CM

## 2023-04-02 DIAGNOSIS — Z79899 Other long term (current) drug therapy: Secondary | ICD-10-CM | POA: Diagnosis not present

## 2023-04-02 DIAGNOSIS — I2510 Atherosclerotic heart disease of native coronary artery without angina pectoris: Secondary | ICD-10-CM | POA: Diagnosis present

## 2023-04-02 DIAGNOSIS — Z7984 Long term (current) use of oral hypoglycemic drugs: Secondary | ICD-10-CM

## 2023-04-02 DIAGNOSIS — K219 Gastro-esophageal reflux disease without esophagitis: Secondary | ICD-10-CM | POA: Diagnosis present

## 2023-04-02 DIAGNOSIS — D689 Coagulation defect, unspecified: Secondary | ICD-10-CM | POA: Diagnosis present

## 2023-04-02 DIAGNOSIS — E869 Volume depletion, unspecified: Secondary | ICD-10-CM | POA: Diagnosis present

## 2023-04-02 DIAGNOSIS — D62 Acute posthemorrhagic anemia: Secondary | ICD-10-CM | POA: Diagnosis present

## 2023-04-02 DIAGNOSIS — Z85828 Personal history of other malignant neoplasm of skin: Secondary | ICD-10-CM

## 2023-04-02 DIAGNOSIS — Z882 Allergy status to sulfonamides status: Secondary | ICD-10-CM

## 2023-04-02 DIAGNOSIS — Z923 Personal history of irradiation: Secondary | ICD-10-CM

## 2023-04-02 DIAGNOSIS — K5731 Diverticulosis of large intestine without perforation or abscess with bleeding: Secondary | ICD-10-CM | POA: Diagnosis present

## 2023-04-02 DIAGNOSIS — D500 Iron deficiency anemia secondary to blood loss (chronic): Secondary | ICD-10-CM | POA: Diagnosis not present

## 2023-04-02 DIAGNOSIS — Z885 Allergy status to narcotic agent status: Secondary | ICD-10-CM

## 2023-04-02 LAB — CBC
HCT: 33.9 % — ABNORMAL LOW (ref 39.0–52.0)
HCT: 32.2 % — ABNORMAL LOW (ref 39.0–52.0)
Hemoglobin: 10.8 g/dL — ABNORMAL LOW (ref 13.0–17.0)
Hemoglobin: 10.4 g/dL — ABNORMAL LOW (ref 13.0–17.0)
MCH: 29.8 pg (ref 26.0–34.0)
MCH: 30.3 pg (ref 26.0–34.0)
MCHC: 31.9 g/dL (ref 30.0–36.0)
MCHC: 32.3 g/dL (ref 30.0–36.0)
MCV: 93.6 fL (ref 80.0–100.0)
MCV: 93.9 fL (ref 80.0–100.0)
Platelets: 235 10*3/uL (ref 150–400)
Platelets: 247 10*3/uL (ref 150–400)
RBC: 3.62 MIL/uL — ABNORMAL LOW (ref 4.22–5.81)
RBC: 3.43 MIL/uL — ABNORMAL LOW (ref 4.22–5.81)
RDW: 14 % (ref 11.5–15.5)
RDW: 13.9 % (ref 11.5–15.5)
WBC: 5.9 10*3/uL (ref 4.0–10.5)
WBC: 7.3 10*3/uL (ref 4.0–10.5)
nRBC: 0 % (ref 0.0–0.2)
nRBC: 0 % (ref 0.0–0.2)

## 2023-04-02 LAB — COMPREHENSIVE METABOLIC PANEL
ALT: 10 U/L (ref 0–44)
AST: 15 U/L (ref 15–41)
Albumin: 4.1 g/dL (ref 3.5–5.0)
Alkaline Phosphatase: 60 U/L (ref 38–126)
Anion gap: 9 (ref 5–15)
BUN: 25 mg/dL — ABNORMAL HIGH (ref 8–23)
CO2: 24 mmol/L (ref 22–32)
Calcium: 8.9 mg/dL (ref 8.9–10.3)
Chloride: 105 mmol/L (ref 98–111)
Creatinine, Ser: 1.11 mg/dL (ref 0.61–1.24)
GFR, Estimated: >60 mL/min (ref >60)
Glucose, Bld: 111 mg/dL — ABNORMAL HIGH (ref 70–99)
Potassium: 4.6 mmol/L (ref 3.5–5.1)
Sodium: 138 mmol/L (ref 135–145)
Total Bilirubin: 0.5 mg/dL (ref 0.3–1.2)
Total Protein: 6.5 g/dL (ref 6.5–8.1)

## 2023-04-02 LAB — PREPARE FRESH FROZEN PLASMA

## 2023-04-02 LAB — PROTIME-INR
INR: 2.5 — ABNORMAL HIGH (ref 0.8–1.2)
Prothrombin Time: 27.6 seconds — ABNORMAL HIGH (ref 11.4–15.2)

## 2023-04-02 LAB — BPAM FFP

## 2023-04-02 MED ORDER — EVOLOCUMAB 140 MG/ML ~~LOC~~ SOAJ: 140.0000 mg | SUBCUTANEOUS | Status: DC

## 2023-04-02 MED ORDER — IOHEXOL 350 MG/ML SOLN
100.0000 mL | Freq: Once | INTRAVENOUS | Status: AC | PRN
  Administered 2023-04-02: 100 mL via INTRAVENOUS

## 2023-04-02 MED ORDER — PANTOPRAZOLE SODIUM 40 MG IV SOLR
40.0000 mg | Freq: Two times a day (BID) | INTRAVENOUS | Status: DC
  Administered 2023-04-02 – 2023-04-08 (×12): 40 mg via INTRAVENOUS
  Filled 2023-04-02 (×12): qty 10

## 2023-04-02 MED ORDER — ATORVASTATIN CALCIUM 80 MG PO TABS
80.0000 mg | ORAL_TABLET | Freq: Every day | ORAL | Status: DC
  Administered 2023-04-03 – 2023-04-08 (×6): 80 mg via ORAL
  Filled 2023-04-02 (×2): qty 4
  Filled 2023-04-02: qty 1
  Filled 2023-04-02: qty 4
  Filled 2023-04-02: qty 1
  Filled 2023-04-02: qty 4

## 2023-04-02 MED ORDER — ONDANSETRON 4 MG PO TBDP: 4.0000 mg | ORAL_TABLET | Freq: Three times a day (TID) | ORAL | Status: DC | PRN

## 2023-04-02 MED ORDER — CARVEDILOL 6.25 MG PO TABS
6.2500 mg | ORAL_TABLET | Freq: Two times a day (BID) | ORAL | Status: DC
  Administered 2023-04-03 – 2023-04-08 (×11): 6.25 mg via ORAL
  Filled 2023-04-02 (×11): qty 1

## 2023-04-02 MED ORDER — SODIUM CHLORIDE 0.9% FLUSH
3.0000 mL | Freq: Two times a day (BID) | INTRAVENOUS | Status: DC
  Administered 2023-04-03 – 2023-04-07 (×11): 3 mL via INTRAVENOUS

## 2023-04-02 MED ORDER — INSULIN ASPART 100 UNIT/ML IJ SOLN: 0.0000 [IU] | Freq: Every day | INTRAMUSCULAR | Status: DC

## 2023-04-02 MED ORDER — LACTATED RINGERS IV BOLUS
1000.0000 mL | Freq: Once | INTRAVENOUS | Status: AC
  Administered 2023-04-02: 1000 mL via INTRAVENOUS

## 2023-04-02 MED ORDER — SODIUM CHLORIDE 0.9 % IV SOLN: Freq: Once | INTRAVENOUS | Status: AC

## 2023-04-02 MED ORDER — LACTATED RINGERS IV SOLN: INTRAVENOUS | Status: DC

## 2023-04-02 MED ORDER — TAMSULOSIN HCL 0.4 MG PO CAPS
0.4000 mg | ORAL_CAPSULE | Freq: Every day | ORAL | Status: DC
  Administered 2023-04-03 – 2023-04-07 (×5): 0.4 mg via ORAL
  Filled 2023-04-02 (×5): qty 1

## 2023-04-02 MED ORDER — VITAMIN K1 10 MG/ML IJ SOLN
5.0000 mg | Freq: Once | INTRAVENOUS | Status: AC
  Administered 2023-04-02: 5 mg via INTRAVENOUS
  Filled 2023-04-02: qty 0.5

## 2023-04-02 MED ORDER — ACETAMINOPHEN 650 MG RE SUPP: 650.0000 mg | Freq: Four times a day (QID) | RECTAL | Status: DC | PRN

## 2023-04-02 MED ORDER — INSULIN ASPART 100 UNIT/ML IJ SOLN: 0.0000 [IU] | Freq: Three times a day (TID) | INTRAMUSCULAR | Status: DC

## 2023-04-02 MED ORDER — ACETAMINOPHEN 325 MG PO TABS
650.0000 mg | ORAL_TABLET | Freq: Four times a day (QID) | ORAL | Status: DC | PRN
  Administered 2023-04-04 – 2023-04-07 (×3): 650 mg via ORAL
  Filled 2023-04-02 (×3): qty 2

## 2023-04-02 NOTE — ED Provider Notes (Signed)
[facility] Provider Note    Event Date/Time   First MD Initiated Contact with Patient 04/02/23 2051     (approximate)   History   Rectal Bleeding   HPI  [name] [name] is a 77 y.o. 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  Symptoms started today.  Patient has had 3 total bloody bowel movements.  The first 2 does look dark.  And have an episode of bright red blood filling the toilet.  Has not had any episodes since this.  Did have some mild abdominal pain onset but denies pain currently. Denies lightheadedness chest pain or dyspnea.  Tells me he had history of similar episode in 2020 and was told it was because of history of radiation to his prostate.  Patient was admitted 3/17 and had stent to the mid LAD.  He is currently on Plavix in addition to his Coumadin.     Past Medical History:  Diagnosis Date   Anemia    Arthritis    AVN of femur (HCC)    RIGHT   Basal cell carcinoma    face, legs   DVT (deep venous thrombosis) (HCC) 2005   after hernia surgery. 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   Prostate cancer (HCC) 2018   Treated with radiation   Rectal bleeding    2013, 2020    Patient Active Problem List   Diagnosis Date Noted   Unstable angina (HCC) 01/30/2023   NSTEMI (non-ST elevated myocardial infarction) (HCC) 01/30/2023   S/P cardiac catheterization 01/30/2023   Other hydronephrosis    Acute UTI    Sepsis (HCC) 04/25/2022   Ureteral colic    Acute unilateral obstructive uropathy 04/06/2022   Dyslipidemia 04/06/2022   AKI (acute kidney injury) (HCC) 04/06/2022   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   Obesity (BMI 30-39.9) 04/06/2022   Anxiety 04/06/2022   H/O total  hip arthroplasty 10/21/2020   Anemia 07/02/2020   Hypertension 07/02/2020   History of DVT (deep vein thrombosis) 07/26/2019   Hyperlipidemia 07/26/2019   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   Rectal bleeding 06/30/2019   Avascular necrosis of bone of right hip (HCC) 09/30/2017   History of prostate cancer 11/26/2016   Cancer of prostate with intermediate recurrence risk (stage T2b-c or Gleason 7 or PSA 10-20) (HCC) 11/24/2016   Phlebitis and thrombophlebitis of deep vessels of lower extremities (HCC) 03/30/2014     Physical Exam  Triage Vital Signs: ED Triage Vitals  Enc Vitals Group     BP 04/02/23 1657 132/68     Pulse Rate 04/02/23 1657 68     Resp 04/02/23 1657 18     Temp 04/02/23 1657 98.6 F (37 C)     Temp Source 04/02/23 1657 Oral     SpO2 04/02/23 1657 99 %     Weight 04/02/23 1657 201 lb (91.2 kg)     Height 04/02/23 1657 5\' 10"  (1.778 m)     Head Circumference --      Peak Flow --      Pain Score 04/02/23 1709 0     Pain Loc --      Pain Edu? --      Excl.  in GC? --     Most recent vital signs: Vitals:   04/02/23 2057 04/02/23 2100  BP:  117/68  Pulse:  70  Resp:  18  Temp: 98.4 F (36.9 C)   SpO2:  100%     General: Awake, no distress.  CV:  Good peripheral perfusion.  Resp:  Normal effort.  Abd:  No distention.  Nontender soft Neuro:             Awake, Alert, Oriented x 3  Other:  dark blood on rectal exam   ED Results / Procedures / Treatments  Labs (all labs ordered are listed, but only abnormal results are displayed) Labs Reviewed  COMPREHENSIVE METABOLIC PANEL - Abnormal; Notable for the following components:      Result Value   Glucose, Bld 111 (*)    BUN 25 (*)    All other components within normal limits  CBC - Abnormal; Notable for the following components:   RBC 3.62 (*)    Hemoglobin 10.8 (*)    HCT 33.9 (*)    All other components within normal limits  PROTIME-INR - Abnormal; Notable for the following components:   Prothrombin  Time 27.6 (*)    INR 2.5 (*)    All other components within normal limits  TYPE AND SCREEN     EKG     RADIOLOGY    PROCEDURES:  Critical Care performed: Yes, see critical care procedure note(s)  .Critical Care  Performed by: Georga Hacking, MD Authorized by: Georga Hacking, MD   Critical care provider statement:    Critical care time (minutes):  30   Critical care was time spent personally by me on the following activities:  Development of treatment plan with patient or surrogate, discussions with consultants, evaluation of patient's response to treatment, examination of patient, ordering and review of laboratory studies, ordering and review of radiographic studies, ordering and performing treatments and interventions, pulse oximetry, re-evaluation of patient's condition and review of old charts   The patient is on the cardiac monitor to evaluate for evidence of arrhythmia and/or significant heart rate changes.   MEDICATIONS ORDERED IN ED: Medications  phytonadione (VITAMIN K) 5 mg in dextrose 5 % 50 mL IVPB (has no administration in time range)     IMPRESSION / MDM / ASSESSMENT AND PLAN / ED COURSE  I reviewed the triage vital signs and the nursing notes.                              Patient's presentation is most consistent with acute presentation with potential threat to life or bodily function.  Differential diagnosis includes, but is not limited to, diverticular hemorrhage, malignancy, radiation colitis, AVM, angiodysplasia  The patient is a 77 year old male who is currently on Plavix and Coumadin for history of recent NSTEMI and DVTs respectively who presents with bloody BMs.  This started today he has had 3 total episodes.  Describes the first episode as dark blood mixed with his stool and then had an episode of bright blood in the toilet.  Shows me a picture of his last bowel movement and there is bright red blood as well as some darker clots in the  toilet.  He has not had any bloody stool since 2:45 PM.  Denies abdominal pain denies lightheadedness chest pain shortness of breath or syncope.  Patient is hemodynamically stable.  On exam he looks well abdominal exam  is benign.  Has some dark red blood on rectal exam.  Hemoglobin is 10.8 down from around 12.  INR is 2.5.  BUN mildly elevated at 25.  Suspect lower GI bleed.  Patient appears stable at the moment but with him being on both Plavix and Coumadin he is at risk for severe bleeding.  Given he is showing signs of active bleeding with ongoing blood on rectal exam will give a dose of vitamin K.  Discussed dosing with pharmacy they recommend 5 mg.  Discussed this with the patient he has no recent DVTs I think this is reasonable.  Will hold off on PCC's at this time.  Patient has an active type and screen.  Will need admission.       FINAL CLINICAL IMPRESSION(S) / ED DIAGNOSES   Final diagnoses:  Gastrointestinal hemorrhage, unspecified gastrointestinal hemorrhage type     Rx / DC Orders   ED Discharge Orders     None        Note:  This document was prepared using Dragon voice recognition software and may include unintentional dictation errors.   Georga Hacking, MD 04/02/23 2141

## 2023-04-02 NOTE — Assessment & Plan Note (Signed)
In the setting of above. Harbinger of vital instability. LR 1 liter ordered. 100 cc thereafter. Upgrade to progressive care unit.

## 2023-04-02 NOTE — ED Notes (Signed)
While having a bowel movement, pt became diaphoretic and dizzy. Pt placed into a wheelchair, provider notified and at bedside. No LOC, pt remained A&Ox4, pale color noted.

## 2023-04-02 NOTE — Assessment & Plan Note (Signed)
Ending CBC now and at midnight.  We will give transfusion if below 8 g/dL.

## 2023-04-02 NOTE — Assessment & Plan Note (Addendum)
Minimal elevation in BUN might be due to prerenal state.  Follow-up PTT as ordered.  Patient has therapeutic INR on warfarin.  Patient has had 2 prior (remote) episodes of lower GI bleed , 1 of which was attributed to radiation proctitis.  This was several years ago.  At this time hold warfarin, agree with vitamin K as ordered.  Patient had one more episode of GI bleeding at about 1030 PM with presyncope. Will transfuse FFP (detailed discussionwith wife at bedside, abbrevieated discussion with patient as he does not seem receptive at this time).  At this time bleeding seems to be "ongoing". CAT scan angiogram.  Pantoprazole has been ordered empirically

## 2023-04-02 NOTE — Assessment & Plan Note (Signed)
Warfarin will have to be reversed.

## 2023-04-02 NOTE — Assessment & Plan Note (Addendum)
Patient had NSTEMI on or around January 31, 2023.  At this time patient reports not being on aspirin and having stopped Plavix today.  Also reports not being on statin.  This is a little atypical to me. I believe patient has had some miscommunication with the cardiologist.  I will involve Dr. Juliann Pares in patient's care tomorrow morning.  At this time I will restart the statin.

## 2023-04-02 NOTE — H&P (Addendum)
History and Physical    Patient: [name] [name]:[id number] DOB: 1946-02-27 DOA: 04/02/2023 DOS: the patient was seen and examined on 04/02/2023 PCP: Lynnea Ferrier, MD  Patient coming from: Home  Chief Complaint:  Chief Complaint  Patient presents with   Rectal Bleeding   HPI: [name] [name] is a 77 y.o. 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  Patient was in his usual state of health till yesterday evening.  Since getting up this morning patient has had 4 loose bowel movements that have become increasingly maroon-colored.  Last bowel movement was approximately 2 PM.  Patient has shown a photo of the last bowel movement in the bowel with some clot and maroon-colored.  Patient had no vomiting no pain no fever.  No presyncope no other site of bleeding such as gum bleeding nosebleeding or skin bruising.  Patient came to the ER for further evaluation.  There have been no episodes of lower GI bleed in the hospital. Review of Systems: As mentioned in the history of present illness. All other systems reviewed and are negative. Past Medical History:  Diagnosis Date   Anemia    Arthritis    AVN of femur (HCC)    RIGHT   Basal cell carcinoma    face, legs   DVT (deep venous thrombosis) (HCC) 2005   after hernia surgery. 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   Prostate cancer (HCC) 2018   Treated with radiation   Rectal bleeding    2013, 2020   Past Surgical History:  Procedure Laterality Date   COLONOSCOPY N/A 07/02/2019   Procedure: COLONOSCOPY;  Surgeon: Toney Reil, MD;  Location: [facility] ENDOSCOPY;  Service: Gastroenterology;  Laterality: N/A;   COLONOSCOPY     2001,  2011   CORONARY/GRAFT ACUTE MI REVASCULARIZATION N/A 01/30/2023   Procedure: Coronary/Graft Acute MI Revascularization;  Surgeon: Orbie Pyo, MD;  Location: [facility] INVASIVE CV LAB;  Service: Cardiovascular;  Laterality: N/A;   CYSTOSCOPY W/ RETROGRADES Right 04/06/2022   Procedure: CYSTOSCOPY WITH RETROGRADE PYELOGRAM;  Surgeon: Vanna Scotland, MD;  Location: [facility] ORS;  Service: Urology;  Laterality: Right;   CYSTOSCOPY/URETEROSCOPY/HOLMIUM LASER/STENT PLACEMENT Right 04/06/2022   Procedure: CYSTOSCOPY/URETEROSCOPY/HOLMIUM LASER/STENT PLACEMENT;  Surgeon: Vanna Scotland, MD;  Location: [facility] ORS;  Service: Urology;  Laterality: Right;   CYSTOSCOPY/URETEROSCOPY/HOLMIUM LASER/STENT PLACEMENT Right 04/21/2022   Procedure: CYSTOSCOPY/URETEROSCOPY/HOLMIUM LASER/STENT EXCHANGE;  Surgeon: Riki Altes, MD;  Location: [facility] ORS;  Service: Urology;  Laterality: Right;   ESOPHAGOGASTRODUODENOSCOPY N/A 07/02/2019   Procedure: ESOPHAGOGASTRODUODENOSCOPY (EGD);  Surgeon: Toney Reil, MD;  Location: [facility] ENDOSCOPY;  Service: Gastroenterology;  Laterality: N/A;   ESOPHAGOGASTRODUODENOSCOPY  2013   FOOT FRACTURE SURGERY Right 2012   Heel fracture repair/ has metal, fell off ladder   HERNIA REPAIR Right 2005   inguinal   IVC FILTER INSERTION N/A 09/23/2020   Procedure: IVC FILTER INSERTION;  Surgeon: Annice Needy, MD;  Location: [facility] INVASIVE CV LAB;  Service: Cardiovascular;  Laterality: N/A;   IVC FILTER REMOVAL N/A 11/13/2020   Procedure: IVC FILTER REMOVAL;  Surgeon: Annice Needy, MD;  Location: [facility] INVASIVE CV LAB;  Service: Cardiovascular;  Laterality:  N/A;   LEFT HEART CATH AND CORONARY ANGIOGRAPHY N/A 01/30/2023   Procedure: LEFT HEART CATH AND CORONARY ANGIOGRAPHY;  Surgeon: Orbie Pyo, MD;  Location: [facility] INVASIVE CV LAB;  Service: Cardiovascular;  Laterality: N/A;   TONSILLECTOMY     as a child   TOTAL HIP ARTHROPLASTY Right 10/21/2020   Procedure: TOTAL HIP ARTHROPLASTY;   Surgeon: Donato Heinz, MD;  Location: [facility] ORS;  Service: Orthopedics;  Laterality: Right;   XI ROBOTIC ASSISTED INGUINAL HERNIA REPAIR WITH MESH Right 07/18/2021   Procedure: XI ROBOTIC ASSISTED INGUINAL HERNIA REPAIR WITH MESH;  Surgeon: Sung Amabile, DO;  Location: [facility] ORS;  Service: General;  Laterality: Right;   Social History:  reports that he quit smoking about 42 years ago. His smoking use included cigarettes. He has never used smokeless tobacco. He reports that he does not currently use alcohol. He reports that he does not use drugs.  Allergies  Allergen Reactions   Rosuvastatin Other (See Comments)   Codeine Nausea Only   Sulfa Antibiotics Nausea And Vomiting    Family History  Problem Relation Age of Onset   Heart attack Father     Prior to Admission medications   Medication Sig Start Date End Date Taking? Authorizing Provider  acetaminophen (TYLENOL) 500 MG tablet Take 1,000 mg by mouth at bedtime as needed.   Yes [provider]  carvedilol (COREG) 6.25 MG tablet Take 1 tablet (6.25 mg total) by mouth 2 (two) times daily with a meal. 01/31/23 05/31/23 Yes Kirstie Peri, MD  Cyanocobalamin 2500 MCG SUBL Take 2,500 mcg by mouth daily. Takes at 11 am   Yes [provider]  empagliflozin (JARDIANCE) 10 MG TABS tablet Take 1 tablet by mouth daily. 03/16/23 03/15/24 Yes [provider]  Evolocumab 140 MG/ML SOAJ Inject 140 mg into the skin. Inject 140 mg subcutaneously every 14 (fourteen) days 02/05/23  Yes [provider]  Glucosamine-Chondroitin (OSTEO BI-FLEX REGULAR STRENGTH PO) Take 2 tablets by mouth daily. Takes at 11 am   Yes [provider]  ipratropium (ATROVENT) 0.03 % nasal spray Place 1 spray into the nose daily. 01/14/23 01/14/24 Yes [provider]  lisinopril (ZESTRIL) 5 MG tablet Take 5 mg by mouth daily. 02/05/23  Yes [provider]  nitroGLYCERIN (NITROSTAT) 0.4 MG SL tablet Place 1 tablet (0.4 mg total)  under the tongue every 5 (five) minutes as needed for up to 1 dose for chest pain. 01/31/23  Yes Kirstie Peri, MD  pantoprazole (PROTONIX) 40 MG tablet Take 40 mg by mouth daily. 02/05/23  Yes [provider]  tamsulosin (FLOMAX) 0.4 MG CAPS capsule Take 0.4 mg by mouth daily with lunch. 04/14/22  Yes [provider]  warfarin (COUMADIN) 3 MG tablet Take 3 mg by mouth as directed. Takes on Mondays, Tuesday, Wednesdays, Thursday and Fridays 01/13/22 04/02/23 Yes [provider]  aspirin 81 MG chewable tablet Chew 1 tablet (81 mg total) by mouth daily. Patient not taking: Reported on 04/02/2023 02/01/23 04/02/23  Kirstie Peri, MD  atorvastatin (LIPITOR) 80 MG tablet Take 1 tablet (80 mg total) by mouth daily for 60 doses. Patient not taking: Reported on 04/02/2023 02/01/23 04/02/23  Kirstie Peri, MD  clopidogrel (PLAVIX) 75 MG tablet Take 1 tablet (75 mg total) by mouth daily with breakfast. Patient not taking: Reported on 04/02/2023 02/01/23 04/02/23  Kirstie Peri, MD  lisinopril (ZESTRIL) 2.5 MG tablet Take 2 tablets (5 mg total) by mouth daily for 60 doses. Patient not taking:  Reported on 04/02/2023 02/01/23 04/02/23  Kirstie Peri, MD  ondansetron (ZOFRAN-ODT) 4 MG disintegrating tablet Take 1 tablet (4 mg total) by mouth every 8 (eight) hours as needed for nausea or vomiting. Patient not taking: Reported on 01/30/2023 04/05/22   Nita Sickle, MD  oxyCODONE-acetaminophen (PERCOCET) 5-325 MG tablet Take 1 tablet by mouth every 4 (four) hours as needed. Patient not taking: Reported on 01/30/2023 04/05/22   Nita Sickle, MD  phosphorus (K PHOS NEUTRAL) [phone] MG tablet Take 1 tablet (250 mg total) by mouth 3 (three) times daily. Patient not taking: Reported on 01/30/2023 04/27/22   Willeen Niece, MD  pravastatin (PRAVACHOL) 10 MG tablet Take 10 mg by mouth daily. Patient not taking: Reported on 04/02/2023    [provider]  spironolactone (ALDACTONE) 25 MG  tablet Take 0.5 tablets (12.5 mg total) by mouth daily. Patient not taking: Reported on 04/02/2023 02/01/23 04/02/23  Kirstie Peri, MD  triamterene-hydrochlorothiazide (MAXZIDE-25) 37.5-25 MG tablet Take 0.5 tablets by mouth daily with lunch. Takes at 11 am Patient not taking: Reported on 04/02/2023 06/02/17   [provider]  warfarin (COUMADIN) 4 MG tablet Take 4 mg by mouth as directed. Takes on Sat and Sun. 06/19/19 01/30/23  [provider]    Physical Exam: Vitals:   04/02/23 1657 04/02/23 1710 04/02/23 2057 04/02/23 2100  BP: 132/68   117/68  Pulse: 68   70  Resp: 18   18  Temp: 98.6 F (37 C)  98.4 F (36.9 C)   TempSrc: Oral  Oral   SpO2: 99%   100%  Weight: 91.2 kg 91 kg    Height: 5\' 10"  (1.778 m) 5\' 10"  (1.778 m)     General: Patient in no distress, wife at bedside Respiratory exam: Bilateral intravesicular Cardiovascular exam S1-S2 normal Abdomen bowel sounds are increased all quadrants soft nontender.  Rectal exam was done by ER attending, reviewed finding of some bloodstained stool. Extremities warm without edema Data Reviewed:  Labs on Admission:  Results for orders placed or performed during the hospital encounter of 04/02/23 (from the past 24 hour(s))  Comprehensive metabolic panel     Status: Abnormal   Collection Time: 04/02/23  5:06 PM  Result Value Ref Range   Sodium 138 135 - 145 mmol/L   Potassium 4.6 3.5 - 5.1 mmol/L   Chloride 105 98 - 111 mmol/L   CO2 24 22 - 32 mmol/L   Glucose, Bld 111 (H) 70 - 99 mg/dL   BUN 25 (H) 8 - 23 mg/dL   Creatinine, Ser 7.82 0.61 - 1.24 mg/dL   Calcium 8.9 8.9 - 95.6 mg/dL   Total Protein 6.5 6.5 - 8.1 g/dL   Albumin 4.1 3.5 - 5.0 g/dL   AST 15 15 - 41 U/L   ALT 10 0 - 44 U/L   Alkaline Phosphatase 60 38 - 126 U/L   Total Bilirubin 0.5 0.3 - 1.2 mg/dL   GFR, Estimated >21 >30 mL/min   Anion gap 9 5 - 15  CBC     Status: Abnormal   Collection Time: 04/02/23  5:06 PM  Result Value Ref Range   WBC 5.9  4.0 - 10.5 K/uL   RBC 3.62 (L) 4.22 - 5.81 MIL/uL   Hemoglobin 10.8 (L) 13.0 - 17.0 g/dL   HCT 86.5 (L) 78.4 - 69.6 %   MCV 93.6 80.0 - 100.0 fL   MCH 29.8 26.0 - 34.0 pg   MCHC 31.9 30.0 - 36.0 g/dL  RDW 14.0 11.5 - 15.5 %   Platelets 235 150 - 400 K/uL   nRBC 0.0 0.0 - 0.2 %  Protime-INR - (order if Patient is taking Coumadin / Warfarin)     Status: Abnormal   Collection Time: 04/02/23  5:06 PM  Result Value Ref Range   Prothrombin Time 27.6 (H) 11.4 - 15.2 seconds   INR 2.5 (H) 0.8 - 1.2  Type and screen [facility]     Status: None   Collection Time: 04/02/23  5:13 PM  Result Value Ref Range   ABO/RH(D) O POS    Antibody Screen NEG    Sample Expiration      04/05/2023,2359 Performed at [facility], [address] [place], [place] [address]    Basic Metabolic Panel: Recent Labs  Lab 04/02/23 1706  NA 138  K 4.6  CL 105  CO2 24  GLUCOSE 111*  BUN 25*  CREATININE 1.11  CALCIUM 8.9   Liver Function Tests: Recent Labs  Lab 04/02/23 1706  AST 15  ALT 10  ALKPHOS 60  BILITOT 0.5  PROT 6.5  ALBUMIN 4.1   No results for input(s): "LIPASE", "AMYLASE" in the last 168 hours. No results for input(s): "AMMONIA" in the last 168 hours. CBC: Recent Labs  Lab 04/02/23 1706  WBC 5.9  HGB 10.8*  HCT 33.9*  MCV 93.6  PLT 235   Cardiac Enzymes: No results for input(s): "CKTOTAL", "CKMB", "CKMBINDEX", "TROPONINIHS" in the last 168 hours.  BNP (last 3 results) No results for input(s): "PROBNP" in the last 8760 hours. CBG: No results for input(s): "GLUCAP" in the last 168 hours.  Radiological Exams on Admission:  No results found.  EKG: pending   Assessment and Plan: * Lower GI bleeding Minimal elevation in BUN might be due to prerenal state.  Follow-up PTT as ordered.  Patient has therapeutic INR on warfarin.  Patient has had 2 prior (remote) episodes of lower GI bleed , 1 of which was attributed to radiation proctitis.  This  was several years ago.  At this time hold warfarin, agree with vitamin K as ordered.  Patient had one more episode of GI bleeding at about 1030 PM with presyncope. Will transfuse FFP (detailed discussionwith wife at bedside, abbrevieated discussion with patient as he does not seem receptive at this time).  At this time bleeding seems to be "ongoing". CAT scan angiogram.  Pantoprazole has been ordered empirically  NSTEMI (non-ST elevated myocardial infarction) [facility]) Patient had NSTEMI on or around January 31, 2023.  At this time patient reports not being on aspirin and having stopped Plavix today.  Also reports not being on statin.  This is a little atypical to me. I believe patient has had some miscommunication with the cardiologist.  I will involve Dr. Juliann Pares in patient's care tomorrow morning.  At this time I will restart the statin.  History of DVT (deep vein thrombosis) Warfarin will have to be reversed.  Postural dizziness with presyncope In the setting of above. Harbinger of vital instability. LR 1 liter ordered. 100 cc thereafter. Upgrade to progressive care unit.  Blood loss anemia Ending CBC now and at midnight.  We will give transfusion if below 8 g/dL.      Advance Care Planning:   Code Status: Full Code   Consults: I have sent a secure message to Dr. Juliann Pares via epic. Kindly confirm receipt in AM thanks.  Family Communication: wife at bedside. All questions answered.  Severity  of Illness: The appropriate patient status for this patient is INPATIENT. Inpatient status is judged to be reasonable and necessary in order to provide the required intensity of service to ensure the patient's safety. The patient's presenting symptoms, physical exam findings, and initial radiographic and laboratory data in the context of their chronic comorbidities is felt to place them at high risk for further clinical deterioration. Furthermore, it is not anticipated that the patient will be medically  stable for discharge from the hospital within 2 midnights of admission.   * I certify that at the point of admission it is my clinical judgment that the patient will require inpatient hospital care spanning beyond 2 midnights from the point of admission due to high intensity of service, high risk for further deterioration and high frequency of surveillance required.*  Author: Nolberto Hanlon, MD 04/02/2023 10:20 PM  For on call review [email or web address].

## 2023-04-02 NOTE — ED Triage Notes (Signed)
Pt to ED for bright red blood in stool since this AM. 3 BM today. Pt was taking clopidrogel since March after MI, last dose was today. Pt still takes warfarin and has med list in hand. Denies dizziness, weakness. Pt is well appearing.

## 2023-04-03 DIAGNOSIS — K922 Gastrointestinal hemorrhage, unspecified: Secondary | ICD-10-CM | POA: Diagnosis not present

## 2023-04-03 LAB — CBC
HCT: 23.6 % — ABNORMAL LOW (ref 39.0–52.0)
HCT: 28.1 % — ABNORMAL LOW (ref 39.0–52.0)
Hemoglobin: 7.7 g/dL — ABNORMAL LOW (ref 13.0–17.0)
Hemoglobin: 9.1 g/dL — ABNORMAL LOW (ref 13.0–17.0)
MCH: 29.7 pg (ref 26.0–34.0)
MCH: 29.8 pg (ref 26.0–34.0)
MCHC: 32.4 g/dL (ref 30.0–36.0)
MCHC: 32.6 g/dL (ref 30.0–36.0)
MCV: 91.1 fL (ref 80.0–100.0)
MCV: 92.1 fL (ref 80.0–100.0)
Platelets: 175 10*3/uL (ref 150–400)
Platelets: 207 10*3/uL (ref 150–400)
RBC: 2.59 MIL/uL — ABNORMAL LOW (ref 4.22–5.81)
RBC: 3.05 MIL/uL — ABNORMAL LOW (ref 4.22–5.81)
RDW: 13.6 % (ref 11.5–15.5)
RDW: 13.8 % (ref 11.5–15.5)
WBC: 5.2 10*3/uL (ref 4.0–10.5)
WBC: 9.4 10*3/uL (ref 4.0–10.5)
nRBC: 0 % (ref 0.0–0.2)
nRBC: 0 % (ref 0.0–0.2)

## 2023-04-03 LAB — GLUCOSE, CAPILLARY
Glucose-Capillary: 102 mg/dL — ABNORMAL HIGH (ref 70–99)
Glucose-Capillary: 134 mg/dL — ABNORMAL HIGH (ref 70–99)
Glucose-Capillary: 84 mg/dL (ref 70–99)
Glucose-Capillary: 86 mg/dL (ref 70–99)
Glucose-Capillary: 91 mg/dL (ref 70–99)
Glucose-Capillary: 93 mg/dL (ref 70–99)
Glucose-Capillary: 95 mg/dL (ref 70–99)

## 2023-04-03 LAB — APTT: aPTT: 38 seconds — ABNORMAL HIGH (ref 24–36)

## 2023-04-03 LAB — BASIC METABOLIC PANEL
Anion gap: 5 (ref 5–15)
BUN: 25 mg/dL — ABNORMAL HIGH (ref 8–23)
CO2: 24 mmol/L (ref 22–32)
Calcium: 8.2 mg/dL — ABNORMAL LOW (ref 8.9–10.3)
Chloride: 108 mmol/L (ref 98–111)
Creatinine, Ser: 0.95 mg/dL (ref 0.61–1.24)
GFR, Estimated: >60 mL/min (ref >60)
Glucose, Bld: 95 mg/dL (ref 70–99)
Potassium: 3.8 mmol/L (ref 3.5–5.1)
Sodium: 137 mmol/L (ref 135–145)

## 2023-04-03 LAB — HEMOGLOBIN
Hemoglobin: 10.2 g/dL — ABNORMAL LOW (ref 13.0–17.0)
Hemoglobin: 9.5 g/dL — ABNORMAL LOW (ref 13.0–17.0)

## 2023-04-03 LAB — PROTIME-INR
INR: 1.8 — ABNORMAL HIGH (ref 0.8–1.2)
Prothrombin Time: 21.5 seconds — ABNORMAL HIGH (ref 11.4–15.2)

## 2023-04-03 LAB — BPAM FFP: ISSUE DATE / TIME: 202405180152

## 2023-04-03 LAB — PREPARE RBC (CROSSMATCH)

## 2023-04-03 LAB — BPAM RBC

## 2023-04-03 LAB — HEMOGLOBIN A1C
Hgb A1c MFr Bld: 5.2 % (ref 4.8–5.6)
Mean Plasma Glucose: 102.54 mg/dL

## 2023-04-03 MED ORDER — SODIUM CHLORIDE 0.9% IV SOLUTION: Freq: Once | INTRAVENOUS | Status: AC

## 2023-04-03 MED ORDER — ONDANSETRON HCL 4 MG/2ML IJ SOLN: 4.0000 mg | Freq: Four times a day (QID) | INTRAMUSCULAR | Status: DC | PRN

## 2023-04-03 MED ORDER — INSULIN ASPART 100 UNIT/ML IJ SOLN
0.0000 [IU] | INTRAMUSCULAR | Status: DC
  Administered 2023-04-05: 2 [IU] via SUBCUTANEOUS
  Filled 2023-04-03: qty 1

## 2023-04-03 NOTE — Consult Note (Signed)
CARDIOLOGY CONSULT NOTE               Patient ID: [name] [name] MRN: [id number] DOB/AGE: [birth year] 78 y.o.  Admit date: 04/02/2023 Referring Physician Dr Nolberto Hanlon hospitalist Primary Physician Dr. Elsie Amis primary Primary Cardiologist [facility] Reason for Consultation GI bleeding recent  non-STEMI  HPI: 77 year old male recently suffered a non-STEMI underwent PCI and stent LAD with DES overlapping stents has been on aspirin for 4 weeks then Plavix and Coumadin long-term for about a year he has history of DVTs in the past and with recent stent placement was on antiplatelet agent patient been doing reasonably well but started having loose stools and maroon-colored stools and then was found to have significant clots maroon stools and hemoglobin which is a drop from his baseline of around 12 down to 7 consistent with lower GI bleeding this patient was admitted transfused anticoagulation has been held denies any chest pain now being evaluated by GI  Review of systems complete and found to be negative unless listed above     Past Medical History:  Diagnosis Date   Anemia    Arthritis    AVN of femur (HCC)    RIGHT   Basal cell carcinoma    face, legs   DVT (deep venous thrombosis) (HCC) 2005   after hernia surgery. 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   Prostate cancer (HCC) 2018   Treated with radiation   Rectal bleeding    2013, 2020    Past Surgical History:  Procedure Laterality Date   COLONOSCOPY N/A 07/02/2019   Procedure: COLONOSCOPY;  Surgeon: Toney Reil, MD;  Location: [facility] ENDOSCOPY;  Service: Gastroenterology;  Laterality: N/A;   COLONOSCOPY     2001, 2011   CORONARY/GRAFT ACUTE MI REVASCULARIZATION N/A 01/30/2023   Procedure: Coronary/Graft Acute MI Revascularization;  Surgeon: Orbie Pyo,  MD;  Location: [facility] INVASIVE CV LAB;  Service: Cardiovascular;  Laterality: N/A;   CYSTOSCOPY W/ RETROGRADES Right 04/06/2022   Procedure: CYSTOSCOPY WITH RETROGRADE PYELOGRAM;  Surgeon: Vanna Scotland, MD;  Location: [facility] ORS;  Service: Urology;  Laterality: Right;   CYSTOSCOPY/URETEROSCOPY/HOLMIUM LASER/STENT PLACEMENT Right 04/06/2022   Procedure: CYSTOSCOPY/URETEROSCOPY/HOLMIUM LASER/STENT PLACEMENT;  Surgeon: Vanna Scotland, MD;  Location: [facility] ORS;  Service: Urology;  Laterality: Right;   CYSTOSCOPY/URETEROSCOPY/HOLMIUM LASER/STENT PLACEMENT Right 04/21/2022   Procedure: CYSTOSCOPY/URETEROSCOPY/HOLMIUM LASER/STENT EXCHANGE;  Surgeon: Riki Altes, MD;  Location: [facility] ORS;  Service: Urology;  Laterality: Right;   ESOPHAGOGASTRODUODENOSCOPY N/A 07/02/2019   Procedure: ESOPHAGOGASTRODUODENOSCOPY (EGD);  Surgeon: Toney Reil, MD;  Location: [facility] ENDOSCOPY;  Service: Gastroenterology;  Laterality: N/A;   ESOPHAGOGASTRODUODENOSCOPY  2013   FOOT FRACTURE SURGERY Right 2012   Heel fracture repair/ has metal, fell off ladder   HERNIA REPAIR Right 2005   inguinal   IVC FILTER INSERTION N/A 09/23/2020   Procedure: IVC FILTER INSERTION;  Surgeon: Annice Needy, MD;  Location: [facility] INVASIVE CV LAB;  Service: Cardiovascular;  Laterality: N/A;   IVC FILTER REMOVAL N/A 11/13/2020   Procedure: IVC FILTER REMOVAL;  Surgeon: Annice Needy, MD;  Location: [facility] INVASIVE CV LAB;  Service: Cardiovascular;  Laterality: N/A;   LEFT HEART CATH AND CORONARY ANGIOGRAPHY N/A 01/30/2023   Procedure: LEFT HEART CATH AND CORONARY ANGIOGRAPHY;  Surgeon: Orbie Pyo, MD;  Location: [facility]  INVASIVE CV LAB;  Service: Cardiovascular;  Laterality: N/A;   TONSILLECTOMY     as a child   TOTAL HIP ARTHROPLASTY Right 10/21/2020   Procedure: TOTAL HIP ARTHROPLASTY;  Surgeon: Donato Heinz, MD;  Location: [facility] ORS;  Service: Orthopedics;  Laterality: Right;   XI ROBOTIC ASSISTED INGUINAL HERNIA REPAIR WITH MESH Right  07/18/2021   Procedure: XI ROBOTIC ASSISTED INGUINAL HERNIA REPAIR WITH MESH;  Surgeon: Sung Amabile, DO;  Location: [facility] ORS;  Service: General;  Laterality: Right;    Medications Prior to Admission  Medication Sig Dispense Refill Last Dose   acetaminophen (TYLENOL) 500 MG tablet Take 1,000 mg by mouth at bedtime as needed.   prn at unk   carvedilol (COREG) 6.25 MG tablet Take 1 tablet (6.25 mg total) by mouth 2 (two) times daily with a meal. 60 tablet 3 04/02/2023   Cyanocobalamin 2500 MCG SUBL Take 2,500 mcg by mouth daily. Takes at 11 am   04/02/2023   empagliflozin (JARDIANCE) 10 MG TABS tablet Take 1 tablet by mouth daily.   04/02/2023   Evolocumab 140 MG/ML SOAJ Inject 140 mg into the skin. Inject 140 mg subcutaneously every 14 (fourteen) days   03/25/2023   Glucosamine-Chondroitin (OSTEO BI-FLEX REGULAR STRENGTH PO) Take 2 tablets by mouth daily. Takes at 11 am   04/02/2023   ipratropium (ATROVENT) 0.03 % nasal spray Place 1 spray into the nose daily.   Past Week   lisinopril (ZESTRIL) 5 MG tablet Take 5 mg by mouth daily.   04/02/2023   nitroGLYCERIN (NITROSTAT) 0.4 MG SL tablet Place 1 tablet (0.4 mg total) under the tongue every 5 (five) minutes as needed for up to 1 dose for chest pain. 1 tablet 0 prn at unk   pantoprazole (PROTONIX) 40 MG tablet Take 40 mg by mouth daily.   04/02/2023   tamsulosin (FLOMAX) 0.4 MG CAPS capsule Take 0.4 mg by mouth daily with lunch.   04/02/2023   warfarin (COUMADIN) 3 MG tablet Take 3 mg by mouth as directed. Takes on Mondays, Tuesday, Wednesdays, Thursday and Fridays   04/02/2023   [EXPIRED] aspirin 81 MG chewable tablet Chew 1 tablet (81 mg total) by mouth daily. (Patient not taking: Reported on 04/02/2023) 30 tablet 1 Not Taking   atorvastatin (LIPITOR) 80 MG tablet Take 1 tablet (80 mg total) by mouth daily for 60 doses. (Patient not taking: Reported on 04/02/2023) 30 tablet 1 Not Taking   [EXPIRED] clopidogrel (PLAVIX) 75 MG tablet Take 1 tablet (75 mg total)  by mouth daily with breakfast. (Patient not taking: Reported on 04/02/2023) 30 tablet 1 Not Taking   lisinopril (ZESTRIL) 2.5 MG tablet Take 2 tablets (5 mg total) by mouth daily for 60 doses. (Patient not taking: Reported on 04/02/2023) 60 tablet 1 Not Taking   ondansetron (ZOFRAN-ODT) 4 MG disintegrating tablet Take 1 tablet (4 mg total) by mouth every 8 (eight) hours as needed for nausea or vomiting. (Patient not taking: Reported on 01/30/2023) 20 tablet 0 Not Taking   oxyCODONE-acetaminophen (PERCOCET) 5-325 MG tablet Take 1 tablet by mouth every 4 (four) hours as needed. (Patient not taking: Reported on 01/30/2023) 12 tablet 0 Not Taking   phosphorus (K PHOS NEUTRAL) 155-852-130 MG tablet Take 1 tablet (250 mg total) by mouth 3 (three) times daily. (Patient not taking: Reported on 01/30/2023) 6 tablet 0 Not Taking   pravastatin (PRAVACHOL) 10 MG tablet Take 10 mg by mouth daily. (Patient not taking: Reported on 04/02/2023)   Not Taking  spironolactone (ALDACTONE) 25 MG tablet Take 0.5 tablets (12.5 mg total) by mouth daily. (Patient not taking: Reported on 04/02/2023) 15 tablet 1 Not Taking   triamterene-hydrochlorothiazide (MAXZIDE-25) 37.5-25 MG tablet Take 0.5 tablets by mouth daily with lunch. Takes at 11 am (Patient not taking: Reported on 04/02/2023)   Not Taking   warfarin (COUMADIN) 4 MG tablet Take 4 mg by mouth as directed. Takes on Sat and Sun.   03/28/2023   Social History   Socioeconomic History   Marital status: Married    Spouse name: Bonita Quin   Number of children: 2   Years of education: Not on file   Highest education level: Not on file  Occupational History   Not on file  Tobacco Use   Smoking status: Former    Types: Cigarettes    Quit date: 1982    Years since quitting: 42.4   Smokeless tobacco: Never  Vaping Use   Vaping Use: Never used  Substance and Sexual Activity   Alcohol use: Not Currently   Drug use: Never   Sexual activity: Not on file  Other Topics Concern    Not on file  Social History Narrative   Not on file   Social Determinants of Health   Financial Resource Strain: Not on file  Food Insecurity: No Food Insecurity (04/03/2023)   Hunger Vital Sign    Worried About Running Out of Food in the Last Year: Never true    Ran Out of Food in the Last Year: Never true  Transportation Needs: No Transportation Needs (04/03/2023)   PRAPARE - Administrator, Civil Service (Medical): No    Lack of Transportation (Non-Medical): No  Physical Activity: Not on file  Stress: Not on file  Social Connections: Not on file  Intimate Partner Violence: Not At Risk (04/03/2023)   Humiliation, Afraid, Rape, and Kick questionnaire    Fear of Current or Ex-Partner: No    Emotionally Abused: No    Physically Abused: No    Sexually Abused: No    Family History  Problem Relation Age of Onset   Heart attack Father       Review of systems complete and found to be negative unless listed above      PHYSICAL EXAM  General: Well developed, well nourished, in no acute distress HEENT:  Normocephalic and atramatic Neck:  No JVD.  Lungs: Clear bilaterally to auscultation and percussion. Heart: HRRR . Normal S1 and S2 without gallops or murmurs.  Abdomen: Bowel sounds are positive, abdomen soft and non-tender  Msk:  Back normal, normal gait. Normal strength and tone for age. Extremities: No clubbing, cyanosis or edema.   Neuro: Alert and oriented X 3. Psych:  Good affect, responds appropriately  Labs:   Lab Results  Component Value Date   WBC 5.2 04/03/2023   HGB 7.7 (L) 04/03/2023   HCT 23.6 (L) 04/03/2023   MCV 91.1 04/03/2023   PLT 175 04/03/2023    Recent Labs  Lab 04/02/23 1706 04/03/23 0411  NA 138 137  K 4.6 3.8  CL 105 108  CO2 24 24  BUN 25* 25*  CREATININE 1.11 0.95  CALCIUM 8.9 8.2*  PROT 6.5  --   BILITOT 0.5  --   ALKPHOS 60  --   ALT 10  --   AST 15  --   GLUCOSE 111* 95   No results found for: "CKTOTAL", "CKMB",  "CKMBINDEX", "TROPONINI"  Lab Results  Component Value Date  CHOL 192 01/29/2023   Lab Results  Component Value Date   HDL 44 01/29/2023   Lab Results  Component Value Date   LDLCALC 104 (H) 01/29/2023   Lab Results  Component Value Date   TRIG 218 (H) 01/29/2023   Lab Results  Component Value Date   CHOLHDL 4.4 01/29/2023   No results found for: "LDLDIRECT"    Radiology: CT ANGIO GI BLEED  Result Date: 04/02/2023 CLINICAL DATA:  Lower GI bleed EXAM: CTA ABDOMEN AND PELVIS WITHOUT AND WITH CONTRAST TECHNIQUE: Multidetector CT imaging of the abdomen and pelvis was performed using the standard protocol during bolus administration of intravenous contrast. Multiplanar reconstructed images and MIPs were obtained and reviewed to evaluate the vascular anatomy. RADIATION DOSE REDUCTION: This exam was performed according to the departmental dose-optimization program which includes automated exposure control, adjustment of the mA and/or kV according to patient size and/or use of iterative reconstruction technique. CONTRAST:  OMNIPAQUE IOHEXOL 350 MG/ML SOLN COMPARISON:  CTA chest abdomen and pelvis 01/30/2023 FINDINGS: VASCULAR Aorta: No aneurysm or dissection. Mixed density atherosclerotic plaque. No significant stenosis. Celiac: Calcified plaque at the origin causes mild narrowing. No aneurysm or dissection. SMA: Calcified plaque at the origin causes mild narrowing. No aneurysm or dissection. Renals: 2 right and single left renal arteries. Mild to moderate narrowing of the bilateral renal arteries secondary to atherosclerotic plaque. IMA: Patent. Inflow: Scattered atherosclerotic plaque. No significant stenosis. No aneurysm or dissection. Proximal Outflow: Patent without aneurysm or dissection. Veins: Patent portal vein and IVC. Review of the MIP images confirms the above findings. NON-VASCULAR Lower chest: No acute abnormality. Hepatobiliary: Hepatic cysts. Unremarkable gallbladder and  biliary tree. Pancreas: No acute abnormality. Spleen: Unremarkable. Adrenals/Urinary Tract: Stable adrenal glands. No urinary calculi or hydronephrosis. Unremarkable bladder. Stomach/Bowel: Sigmoid diverticulosis without evidence of diverticulitis. No evidence of active GI bleeding. No bowel wall thickening. No obstruction. Stomach is within normal limits. Lymphatic: No lymphadenopathy. Reproductive: No acute abnormality. Other: No free intraperitoneal fluid or air. Musculoskeletal: Right THA.  No acute fracture. IMPRESSION: 1. No evidence of active GI bleeding. 2. No acute abnormality in the abdomen or pelvis. 3. Sigmoid diverticulosis. Electronically Signed   By: Minerva Fester M.D.   On: 04/02/2023 23:20    EKG: Pending  ASSESSMENT AND PLAN:  GI bleeding Anemia Coronary disease Status post PCI and stent DES Non-STEMI recently History of DVT Hyperlipidemia Chronic anticoagulation . Plan Continue to hold warfarin and Plavix until fully evaluated by GI Transfuse to hemoglobin of 8 or 9 and add iron to help with anemia Patient stable for GI evaluation including endoscopy colonoscopy as a mild risk Continue statin therapy for hyperlipidemia Should restart Coumadin if necessary as per primary physician or vascular for DVT prophylaxis with long history of recurrent DVT.  Will consider whether to restart Plavix once GI evaluation complete will continue to refrain from aspirin Continue conservative cardiac input at this point  Signed: Alwyn Pea MD 04/03/2023, 10:01 AM

## 2023-04-03 NOTE — Progress Notes (Signed)
PROGRESS NOTE    [name] [name]  ZOX:[id number] DOB: Jun 22, 1946 DOA: 04/02/2023 PCP: Lynnea Ferrier, MD    Brief Narrative:  77 y.o. 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  Patient was in his usual state of health till yesterday evening.  Since getting up this morning patient has had 4 loose bowel movements that have become increasingly maroon-colored.  Last bowel movement was approximately 2 PM.  Patient has shown a photo of the last bowel movement in the bowel with some clot and maroon-colored.  Patient had no vomiting no pain no fever.  No presyncope no other site of bleeding such as gum bleeding nosebleeding or skin bruising.  Patient came to the ER for further evaluation.    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  Both his medications were taken 1 day prior to presentation.  Presented with hemoglobin of 10.8.  Patient endorses initially feeling unwell followed by dark stools then bright red blood with clots.  Hemoglobin on presentation 10.8.  Dropped to 7.7.  2 units PRBC ordered.  Patient received vitamin K 5 mg IV x 1 and FFP on arrival.  CT angio negative Plan: Hold aspirin, Plavix, warfarin Daily INR Twice daily IV PPI Check hemoglobin posttransfusion Trend hemoglobin, transfuse less than 8 GI consult, case discussed with Dr. Tobi Bastos  Coronary artery disease History of NSTEMI No chest pain on arrival.  No troponins checked.  Patient had NSTEMI earlier this year.  Placed on medical  management with aspirin and Plavix. Plan: Hold aspirin and Plavix  History of DVT, recurrent Patient maintained on warfarin due to previous episodes of bleeding.  INR on presentation 2.5.  Coagulopathy reversed with vitamin K and FFP.  Current INR 1.8.  Continue to check daily INRs  Postural dizziness with presyncope Likely intravascular volume depletion in the setting of GI bleed.  No evidence of hemodynamic instability.  Will maintain on intravenous fluids.   DVT prophylaxis: SCDs Code Status: Full Family Communication: Spouse at bedside 5/18 Disposition Plan: Status is: Inpatient Remains inpatient appropriate because: GI bleed   Level of care: Progressive  Consultants:  GI  Procedures:  None  Antimicrobials: None   Subjective: Seen and examined.  Resting in bed.  Appears weak/fatigued.  Endorses feeling queasy but denies specifically pain  Objective: Vitals:   04/03/23 0901 04/03/23 0924 04/03/23 0925 04/03/23 1109  BP: (!) 135/57 (!) 131/51 (!) 131/51 (!) 136/59  Pulse: 76 75 75 76  Resp: 14 16 16 16   Temp: 97.7 F (36.5 C) (!) 97.4 F (36.3 C) (!) 97.4 F (36.3 C) 97.9 F (36.6 C)  TempSrc: Oral Oral Oral Oral  SpO2: 98% 97% 97% 97%  Weight:      Height:        Intake/Output Summary (Last 24 hours) at 04/03/2023 1120 Last data filed at 04/03/2023 0909 Gross per 24 hour  Intake 1536.6 ml  Output 1700 ml  Net -163.4 ml   Filed Weights   04/02/23  1657 04/02/23 1710 04/03/23 0015  Weight: 91.2 kg 91 kg 89.6 kg    Examination:  General exam: NAD.  Appears fatigued Respiratory system: Clear to auscultation. Respiratory effort normal. Cardiovascular system: S1-S2, RRR, no murmurs, no pedal edema Gastrointestinal system: Soft, NT/ND, hyperactive bowel sounds Central nervous system: Alert and oriented. No focal neurological deficits. Extremities: Symmetric 5 x 5 power. Skin: No rashes, lesions or ulcers Psychiatry: Judgement and insight appear normal.  Mood & affect appropriate.     Data Reviewed: I have personally reviewed following labs and imaging studies  CBC: Recent Labs  Lab 04/02/23 1706 04/02/23 2243 04/03/23 0014 04/03/23 0411  WBC 5.9 7.3 9.4 5.2  HGB 10.8* 10.4* 9.1* 7.7*  HCT 33.9* 32.2* 28.1* 23.6*  MCV 93.6 93.9 92.1 91.1  PLT 235 247 207 175   Basic Metabolic Panel: Recent Labs  Lab 04/02/23 1706 04/03/23 0411  NA 138 137  K 4.6 3.8  CL 105 108  CO2 24 24  GLUCOSE 111* 95  BUN 25* 25*  CREATININE 1.11 0.95  CALCIUM 8.9 8.2*   GFR: Estimated Creatinine Clearance: 73.3 mL/min (by C-G formula based on SCr of 0.95 mg/dL). Liver Function Tests: Recent Labs  Lab 04/02/23 1706  AST 15  ALT 10  ALKPHOS 60  BILITOT 0.5  PROT 6.5  ALBUMIN 4.1   No results for input(s): "LIPASE", "AMYLASE" in the last 168 hours. No results for input(s): "AMMONIA" in the last 168 hours. Coagulation Profile: Recent Labs  Lab 04/02/23 1706 04/03/23 0411  INR 2.5* 1.8*   Cardiac Enzymes: No results for input(s): "CKTOTAL", "CKMB", "CKMBINDEX", "TROPONINI" in the last 168 hours. BNP (last 3 results) No results for input(s): "PROBNP" in the last 8760 hours. HbA1C: Recent Labs    04/03/23 0014  HGBA1C 5.2   CBG: Recent Labs  Lab 04/03/23 0002 04/03/23 0758  GLUCAP 91 93   Lipid Profile: No results for input(s): "CHOL", "HDL", "LDLCALC", "TRIG", "CHOLHDL", "LDLDIRECT" in the last 72 hours. Thyroid Function Tests: No results for input(s): "TSH", "T4TOTAL", "FREET4", "T3FREE", "THYROIDAB" in the last 72 hours. Anemia Panel: No results for input(s): "VITAMINB12", "FOLATE", "FERRITIN", "TIBC", "IRON", "RETICCTPCT" in the last 72 hours. Sepsis Labs: No results for input(s): "PROCALCITON", "LATICACIDVEN" in the last 168 hours.  No results found for this or any previous visit (from the past 240 hour(s)).       Radiology Studies: CT ANGIO GI BLEED  Result Date: 04/02/2023 CLINICAL DATA:  Lower GI bleed  EXAM: CTA ABDOMEN AND PELVIS WITHOUT AND WITH CONTRAST TECHNIQUE: Multidetector CT imaging of the abdomen and pelvis was performed using the standard protocol during bolus administration of intravenous contrast. Multiplanar reconstructed images and MIPs were obtained and reviewed to evaluate the vascular anatomy. RADIATION DOSE REDUCTION: This exam was performed according to the departmental dose-optimization program which includes automated exposure control, adjustment of the mA and/or kV according to patient size and/or use of iterative reconstruction technique. CONTRAST:  OMNIPAQUE IOHEXOL 350 MG/ML SOLN COMPARISON:  CTA chest abdomen and pelvis 01/30/2023 FINDINGS: VASCULAR Aorta: No aneurysm or dissection. Mixed density atherosclerotic plaque. No significant stenosis. Celiac: Calcified plaque at the origin causes mild narrowing. No aneurysm or dissection. SMA: Calcified plaque at the origin causes mild narrowing. No aneurysm or dissection. Renals: 2 right and single left renal arteries. Mild to moderate narrowing of the bilateral renal arteries secondary to atherosclerotic plaque. IMA: Patent. Inflow: Scattered atherosclerotic plaque. No significant stenosis. No aneurysm or dissection. Proximal Outflow: Patent without aneurysm  or dissection. Veins: Patent portal vein and IVC. Review of the MIP images confirms the above findings. NON-VASCULAR Lower chest: No acute abnormality. Hepatobiliary: Hepatic cysts. Unremarkable gallbladder and biliary tree. Pancreas: No acute abnormality. Spleen: Unremarkable. Adrenals/Urinary Tract: Stable adrenal glands. No urinary calculi or hydronephrosis. Unremarkable bladder. Stomach/Bowel: Sigmoid diverticulosis without evidence of diverticulitis. No evidence of active GI bleeding. No bowel wall thickening. No obstruction. Stomach is within normal limits. Lymphatic: No lymphadenopathy. Reproductive: No acute abnormality. Other: No free intraperitoneal fluid or air.  Musculoskeletal: Right THA.  No acute fracture. IMPRESSION: 1. No evidence of active GI bleeding. 2. No acute abnormality in the abdomen or pelvis. 3. Sigmoid diverticulosis. Electronically Signed   By: Minerva Fester M.D.   On: 04/02/2023 23:20        Scheduled Meds:  atorvastatin  80 mg Oral Daily   carvedilol  6.25 mg Oral BID WC   [START ON 04/08/2023] Evolocumab  140 mg Subcutaneous Q14 Days   insulin aspart  0-15 Units Subcutaneous Q4H   pantoprazole (PROTONIX) IV  40 mg Intravenous Q12H   sodium chloride flush  3 mL Intravenous Q12H   tamsulosin  0.4 mg Oral Q lunch   Continuous Infusions:  lactated ringers 100 mL/hr at 04/03/23 1106     LOS: 1 day      Tresa Moore, MD [facility]   If 7PM-7AM, please contact night-coverage  04/03/2023, 11:20 AM

## 2023-04-03 NOTE — Progress Notes (Addendum)
       CROSS COVER NOTE  NAME: [name] [name] MRN: [id number] DOB : [birth year]    HPI/Events of Note   Report:hemoglobin drop 7.7  On review of chart:patient admitted with GI bleed and actively bleeding     Assessment and  Interventions   Assessment: VSS  98.3, 131/52. Pulse 81 ; pale, generalized weakness, not diaphoretic , oriented x 4  Plan: Transfuse 2 unit PRBCs secondary to active bleed and over 3 gm drop in hgb Discussed with ICU charge/rapid nurse, will leave on 2A unless change in hemodynamics and the are prepared for urgent transfer     Donnie Mesa NP [facility] Cross Cover 7pm-7am - check amion for availability Pager [phone]

## 2023-04-03 NOTE — TOC Initial Note (Signed)
Transition of Care [facility]) - Initial/Assessment Note    Patient Details  Name: [name] [name] MRN: [id number] Date of Birth: September 23, 1946  Transition [facility]) CM/SW Contact:    Bridgett Larsson, LCSW Phone Number: 04/03/2023, 12:41 PM  Clinical Narrative:                 Readmission prevention screen complete. CSW spoke with patient. He resides with spouse and endorses strong support at discharge. He transports self to medical appts, PCP is Dr. Graciela Husbands. Pt reports no barriers with [facility] [place], [place]. He denies any DME needs. No HH services in the home currently. CSW encouraged patient to contact CSW as needed. CSW will continue to follow patient for support.    Expected Discharge Plan: Home/Self Care Barriers to Discharge: Continued Medical Work up   Patient Goals and CMS Choice            Expected Discharge Plan and Services       Living arrangements for the past 2 months: Single Family Home                                      Prior Living Arrangements/Services Living arrangements for the past 2 months: Single Family Home Lives with:: Spouse Patient language and need for interpreter reviewed:: Yes Do you feel safe going back to the place where you live?: Yes      Need for Family Participation in Patient Care: Yes (Comment) Care giver support system in place?: Yes (comment)   Criminal Activity/Legal Involvement Pertinent to Current Situation/Hospitalization: No - Comment as needed  Activities of Daily Living Home Assistive Devices/Equipment: None ADL Screening (condition at time of admission) Patient's cognitive ability adequate to safely complete daily activities?: Yes Is the patient deaf or have difficulty hearing?: No Does the patient have difficulty seeing, even when wearing glasses/contacts?: Yes Does the patient have difficulty concentrating, remembering, or making decisions?: No Patient able to express need for assistance with  ADLs?: Yes Does the patient have difficulty dressing or bathing?: No Independently performs ADLs?: Yes (appropriate for developmental age) Does the patient have difficulty walking or climbing stairs?: No Weakness of Legs: None Weakness of Arms/Hands: None  Permission Sought/Granted                  Emotional Assessment Appearance:: Developmentally appropriate Attitude/Demeanor/Rapport: Engaged Affect (typically observed): Appropriate, Pleasant Orientation: : Oriented to Self, Oriented to Place, Oriented to  Time, Oriented to Situation Alcohol / Substance Use: Not Applicable Psych Involvement: No (comment)  Admission diagnosis:  Lower GI bleeding [K92.2] Gastrointestinal hemorrhage, unspecified gastrointestinal hemorrhage type [K92.2] Patient Active Problem List   Diagnosis Date Noted   Lower GI bleeding 04/02/2023   Postural dizziness with presyncope 04/02/2023   Unstable angina (HCC) 01/30/2023   NSTEMI (non-ST elevated myocardial infarction) (HCC) 01/30/2023   S/P cardiac catheterization 01/30/2023   Other hydronephrosis    Acute UTI    Sepsis (HCC) 04/25/2022   Ureteral colic    Acute unilateral obstructive uropathy 04/06/2022   Dyslipidemia 04/06/2022   AKI (acute kidney injury) (HCC) 04/06/2022   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   Obesity (BMI 30-39.9) 04/06/2022   Anxiety 04/06/2022   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/21/2020   Blood loss anemia 07/02/2020   Hypertension 07/02/2020   History of DVT (deep vein thrombosis) 07/26/2019   Hyperlipidemia 07/26/2019   Radiation  proctitis 07/26/2019   Rectal bleeding 06/30/2019   Avascular necrosis of bone of right hip (HCC) 09/30/2017   History of prostate cancer 11/26/2016   Cancer of prostate with intermediate recurrence risk (stage T2b-c or Gleason 7 or PSA 10-20) (HCC) 11/24/2016   Phlebitis and thrombophlebitis of deep vessels of lower extremities (HCC) 03/30/2014   PCP:  Lynnea Ferrier, MD Pharmacy:    [phone] [place], [place] - [address] [place] [address] Phone: [phone] Fax: [phone]     Social Determinants of Health (SDOH) Social History: SDOH Screenings   Food Insecurity: No Food Insecurity (04/03/2023)  Housing: Patient Declined (04/03/2023)  Transportation Needs: No Transportation Needs (04/03/2023)  Utilities: Not At Risk (04/03/2023)  Tobacco Use: Medium Risk (04/02/2023)   SDOH Interventions:     Readmission Risk Interventions    04/26/2022   11:11 AM  Readmission Risk Prevention Plan  Transportation Screening Complete  PCP or Specialist Appt within 3-5 Days Complete  HRI or Home Care Consult Complete  Social Work Consult for Recovery Care Planning/Counseling Complete  Palliative Care Screening Not Applicable  Medication Review Oceanographer) Complete

## 2023-04-03 NOTE — Consult Note (Signed)
Wyline Mood , MD [address], [place], [place], [address] [address], [place], [place], [address] Phone: [phone]  Fax: [phone]  Consultation  Referring Provider:   Dr Georgeann Oppenheim Primary Care Physician:  Lynnea Ferrier, MD Primary Gastroenterologist:  Dr. Allegra Lai         Reason for Consultation:     Anemia   Date of Admission:  04/02/2023 Date of Consultation:  04/03/2023         HPI:   [name] [name] is a 77 y.o. 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  Came to the hospital with 4 loose bowel movements with maroon color.  Has a history of radiation proctitis.  Seen Dr. Allegra Lai back in 2020 prior colonoscopy showed radiation proctitis treated with argon photocoagulation back in 2020.  Found to have a tubular adenoma and nonspecific chronic gastritis.  Subsequently not seen by GI.  Baseline hemoglobin 2 months back was 12.3 g on admission was 10.8 g and today 7.7 g per.  BMP creatinine of 0.95.  INR 2.5 admission and this morning 1.8.  Taken Coumadin and Plavix on 04/01/2023.  On admission CT angiogram was negative for active bleeding.  Has sigmoid diverticulosis.  Received 2 units PRBCs FFP and vitamin K.  He states that he had bright red blood per rectum starting yesterday multiple painless bloody bowel movements last 1 was when I was in his room and I inspected it myself had tarry black stool.  Denies any abdominal pain denies any NSAID use.  He stopped his blood thinners on Thursday evening as he was aware that something was not right  Past Medical History:  Diagnosis Date   Anemia    Arthritis    AVN of femur (HCC)    RIGHT   Basal cell carcinoma    face, legs   DVT (deep venous thrombosis) (HCC) 2005   after hernia surgery. left leg   GERD (gastroesophageal reflux disease)    History of kidney stones    Hyperlipemia    Hypertension    PONV (postoperative nausea and  vomiting)    Presence of IVC filter 09/23/2020   Prostate cancer (HCC) 2018   Treated with radiation   Rectal bleeding    2013, 2020    Past Surgical History:  Procedure Laterality Date   COLONOSCOPY N/A 07/02/2019   Procedure: COLONOSCOPY;  Surgeon: Toney Reil, MD;  Location: [facility] ENDOSCOPY;  Service: Gastroenterology;  Laterality: N/A;   COLONOSCOPY     2001, 2011   CORONARY/GRAFT ACUTE MI REVASCULARIZATION N/A 01/30/2023   Procedure: Coronary/Graft Acute MI Revascularization;  Surgeon: Orbie Pyo, MD;  Location: [facility] INVASIVE CV LAB;  Service: Cardiovascular;  Laterality: N/A;   CYSTOSCOPY W/ RETROGRADES Right 04/06/2022   Procedure: CYSTOSCOPY WITH RETROGRADE PYELOGRAM;  Surgeon: Vanna Scotland, MD;  Location: [facility] ORS;  Service: Urology;  Laterality: Right;   CYSTOSCOPY/URETEROSCOPY/HOLMIUM LASER/STENT PLACEMENT Right 04/06/2022   Procedure: CYSTOSCOPY/URETEROSCOPY/HOLMIUM LASER/STENT PLACEMENT;  Surgeon: Vanna Scotland, MD;  Location: [facility] ORS;  Service: Urology;  Laterality: Right;   CYSTOSCOPY/URETEROSCOPY/HOLMIUM LASER/STENT PLACEMENT Right 04/21/2022   Procedure: CYSTOSCOPY/URETEROSCOPY/HOLMIUM LASER/STENT EXCHANGE;  Surgeon: Riki Altes, MD;  Location: [facility] ORS;  Service: Urology;  Laterality: Right;   ESOPHAGOGASTRODUODENOSCOPY N/A 07/02/2019   Procedure: ESOPHAGOGASTRODUODENOSCOPY (EGD);  Surgeon: Toney Reil, MD;  Location: [facility] ENDOSCOPY;  Service: Gastroenterology;  Laterality: N/A;   ESOPHAGOGASTRODUODENOSCOPY  2013   FOOT FRACTURE SURGERY Right 2012   Heel fracture repair/ has metal, fell off ladder   HERNIA REPAIR Right 2005   inguinal   IVC FILTER INSERTION N/A 09/23/2020   Procedure: IVC FILTER INSERTION;  Surgeon: Annice Needy, MD;  Location: [facility] INVASIVE CV LAB;  Service: Cardiovascular;  Laterality: N/A;   IVC FILTER REMOVAL N/A 11/13/2020   Procedure: IVC FILTER REMOVAL;  Surgeon: Annice Needy, MD;  Location: [facility] INVASIVE CV LAB;   Service: Cardiovascular;  Laterality: N/A;   LEFT HEART CATH AND CORONARY ANGIOGRAPHY N/A 01/30/2023   Procedure: LEFT HEART CATH AND CORONARY ANGIOGRAPHY;  Surgeon: Orbie Pyo, MD;  Location: [facility] INVASIVE CV LAB;  Service: Cardiovascular;  Laterality: N/A;   TONSILLECTOMY     as a child   TOTAL HIP ARTHROPLASTY Right 10/21/2020   Procedure: TOTAL HIP ARTHROPLASTY;  Surgeon: Donato Heinz, MD;  Location: [facility] ORS;  Service: Orthopedics;  Laterality: Right;   XI ROBOTIC ASSISTED INGUINAL HERNIA REPAIR WITH MESH Right 07/18/2021   Procedure: XI ROBOTIC ASSISTED INGUINAL HERNIA REPAIR WITH MESH;  Surgeon: Sung Amabile, DO;  Location: [facility] ORS;  Service: General;  Laterality: Right;    Prior to Admission medications   Medication Sig Start Date End Date Taking? Authorizing Provider  acetaminophen (TYLENOL) 500 MG tablet Take 1,000 mg by mouth at bedtime as needed.   Yes [provider]  carvedilol (COREG) 6.25 MG tablet Take 1 tablet (6.25 mg total) by mouth 2 (two) times daily with a meal. 01/31/23 05/31/23 Yes Kirstie Peri, MD  Cyanocobalamin 2500 MCG SUBL Take 2,500 mcg by mouth daily. Takes at 11 am   Yes [provider]  empagliflozin (JARDIANCE) 10 MG TABS tablet Take 1 tablet by mouth daily. 03/16/23 03/15/24 Yes [provider]  Evolocumab 140 MG/ML SOAJ Inject 140 mg into the skin. Inject 140 mg subcutaneously every 14 (fourteen) days 02/05/23  Yes [provider]  Glucosamine-Chondroitin (OSTEO BI-FLEX REGULAR STRENGTH PO) Take 2 tablets by mouth daily. Takes at 11 am   Yes [provider]  ipratropium (ATROVENT) 0.03 % nasal spray Place 1 spray into the nose daily. 01/14/23 01/14/24 Yes [provider]  lisinopril (ZESTRIL) 5 MG tablet Take 5 mg by mouth daily. 02/05/23  Yes [provider]  nitroGLYCERIN (NITROSTAT) 0.4 MG SL tablet Place 1 tablet (0.4 mg total) under the tongue every 5 (five) minutes as needed for up to 1  dose for chest pain. 01/31/23  Yes Kirstie Peri, MD  pantoprazole (PROTONIX) 40 MG tablet Take 40 mg by mouth daily. 02/05/23  Yes [provider]  tamsulosin (FLOMAX) 0.4 MG CAPS capsule Take 0.4 mg by mouth daily with lunch. 04/14/22  Yes [provider]  warfarin (COUMADIN) 3 MG tablet Take 3 mg by mouth as directed. Takes on Mondays, Tuesday, Wednesdays, Thursday and Fridays 01/13/22 04/02/23 Yes [provider]  atorvastatin (LIPITOR) 80 MG tablet Take 1 tablet (80 mg total) by mouth daily for 60 doses. Patient not taking: Reported on 04/02/2023 02/01/23 04/02/23  Kirstie Peri, MD  lisinopril (ZESTRIL) 2.5 MG tablet Take 2 tablets (5 mg total) by mouth daily for 60 doses. Patient not taking: Reported on 04/02/2023 02/01/23 04/02/23  Kirstie Peri, MD  ondansetron (ZOFRAN-ODT) 4 MG disintegrating tablet Take 1 tablet (4 mg total) by mouth every 8 (eight) hours as needed for nausea or vomiting. Patient not taking: Reported on 01/30/2023 04/05/22   Nita Sickle, MD  oxyCODONE-acetaminophen [facility]) [phone]  MG tablet Take 1 tablet by mouth every 4 (four) hours as needed. Patient not taking: Reported on 01/30/2023 04/05/22   Nita Sickle, MD  phosphorus (K PHOS NEUTRAL) [phone] MG tablet Take 1 tablet (250 mg total) by mouth 3 (three) times daily. Patient not taking: Reported on 01/30/2023 04/27/22   Willeen Niece, MD  pravastatin (PRAVACHOL) 10 MG tablet Take 10 mg by mouth daily. Patient not taking: Reported on 04/02/2023    [provider]  spironolactone (ALDACTONE) 25 MG tablet Take 0.5 tablets (12.5 mg total) by mouth daily. Patient not taking: Reported on 04/02/2023 02/01/23 04/02/23  Kirstie Peri, MD  triamterene-hydrochlorothiazide (MAXZIDE-25) 37.5-25 MG tablet Take 0.5 tablets by mouth daily with lunch. Takes at 11 am Patient not taking: Reported on 04/02/2023 06/02/17   [provider]  warfarin (COUMADIN) 4 MG tablet Take 4 mg by mouth  as directed. Takes on Sat and Sun. 06/19/19 01/30/23  [provider]    Family History  Problem Relation Age of Onset   Heart attack Father      Social History   Tobacco Use   Smoking status: Former    Types: Cigarettes    Quit date: 1982    Years since quitting: 42.4   Smokeless tobacco: Never  Vaping Use   Vaping Use: Never used  Substance Use Topics   Alcohol use: Not Currently   Drug use: Never    Allergies as of 04/02/2023 - Review Complete 04/02/2023  Allergen Reaction Noted   Rosuvastatin Other (See Comments) 06/25/2022   Codeine Nausea Only 06/28/2014   Sulfa antibiotics Nausea And Vomiting 04/16/2014    Review of Systems:    All systems reviewed and negative except where noted in HPI.   Physical Exam:  Vital signs in last 24 hours: Temp:  [97.4 F (36.3 C)-98.6 F (37 C)] 98 F (36.7 C) (05/18 1131) Pulse Rate:  [66-85] 75 (05/18 1131) Resp:  [14-19] 16 (05/18 1131) BP: (106-143)/(38-89) 125/54 (05/18 1131) SpO2:  [96 %-100 %] 96 % (05/18 1131) Weight:  [89.6 kg-91.2 kg] 89.6 kg (05/18 0015) Last BM Date : 04/03/23 General:   Pleasant, cooperative in NAD Head:  Normocephalic and atraumatic. Eyes:   No icterus.   Conjunctiva pink. PERRLA. Ears:  Normal auditory acuity. Neck:  Supple; no masses or thyroidomegaly Lungs: Respirations even and unlabored. Lungs clear to auscultation bilaterally.   No wheezes, crackles, or rhonchi.  Heart:  Regular rate and rhythm;  Without murmur, clicks, rubs or gallops Abdomen:  Soft, nondistended, nontender. Normal bowel sounds. No appreciable masses or hepatomegaly.  No rebound or guarding.  Neurologic:  Alert and oriented x3;  grossly normal neurologically. Skin:  Intact without significant lesions or rashes. Cervical Nodes:  No significant cervical adenopathy. Psych:  Alert and cooperative. Normal affect.  LAB RESULTS: Recent Labs    04/02/23 2243 04/03/23 0014 04/03/23 0411  WBC 7.3 9.4 5.2  HGB 10.4*  9.1* 7.7*  HCT 32.2* 28.1* 23.6*  PLT 247 207 175   BMET Recent Labs    04/02/23 1706 04/03/23 0411  NA 138 137  K 4.6 3.8  CL 105 108  CO2 24 24  GLUCOSE 111* 95  BUN 25* 25*  CREATININE 1.11 0.95  CALCIUM 8.9 8.2*   LFT Recent Labs    04/02/23 1706  PROT 6.5  ALBUMIN 4.1  AST 15  ALT 10  ALKPHOS 60  BILITOT 0.5   PT/INR Recent Labs    04/02/23 1706 04/03/23 0411  LABPROT 27.6* 21.5*  INR 2.5* 1.8*    STUDIES: CT ANGIO GI BLEED  Result Date: 04/02/2023 CLINICAL DATA:  Lower GI bleed EXAM: CTA ABDOMEN AND PELVIS WITHOUT AND WITH CONTRAST TECHNIQUE: Multidetector CT imaging of the abdomen and pelvis was performed using the standard protocol during bolus administration of intravenous contrast. Multiplanar reconstructed images and MIPs were obtained and reviewed to evaluate the vascular anatomy. RADIATION DOSE REDUCTION: This exam was performed according to the departmental dose-optimization program which includes automated exposure control, adjustment of the mA and/or kV according to patient size and/or use of iterative reconstruction technique. CONTRAST:  OMNIPAQUE IOHEXOL 350 MG/ML SOLN COMPARISON:  CTA chest abdomen and pelvis 01/30/2023 FINDINGS: VASCULAR Aorta: No aneurysm or dissection. Mixed density atherosclerotic plaque. No significant stenosis. Celiac: Calcified plaque at the origin causes mild narrowing. No aneurysm or dissection. SMA: Calcified plaque at the origin causes mild narrowing. No aneurysm or dissection. Renals: 2 right and single left renal arteries. Mild to moderate narrowing of the bilateral renal arteries secondary to atherosclerotic plaque. IMA: Patent. Inflow: Scattered atherosclerotic plaque. No significant stenosis. No aneurysm or dissection. Proximal Outflow: Patent without aneurysm or dissection. Veins: Patent portal vein and IVC. Review of the MIP images confirms the above findings. NON-VASCULAR Lower chest: No acute abnormality.  Hepatobiliary: Hepatic cysts. Unremarkable gallbladder and biliary tree. Pancreas: No acute abnormality. Spleen: Unremarkable. Adrenals/Urinary Tract: Stable adrenal glands. No urinary calculi or hydronephrosis. Unremarkable bladder. Stomach/Bowel: Sigmoid diverticulosis without evidence of diverticulitis. No evidence of active GI bleeding. No bowel wall thickening. No obstruction. Stomach is within normal limits. Lymphatic: No lymphadenopathy. Reproductive: No acute abnormality. Other: No free intraperitoneal fluid or air. Musculoskeletal: Right THA.  No acute fracture. IMPRESSION: 1. No evidence of active GI bleeding. 2. No acute abnormality in the abdomen or pelvis. 3. Sigmoid diverticulosis. Electronically Signed   By: Minerva Fester M.D.   On: 04/02/2023 23:20      Impression / Plan:   [name] [name] is a 77 y.o. y/o male with a history of prostate cancer with history of radiation proctitis last treated by Dr. Allegra Lai in 2020 with argon photocoagulation.  Recent NSTEMI with drug-eluting stent placed in March 2024 on Plavix and Coumadin, history of recurrent DVTs.  Presented to the emergency room with bloody bowel movements.  Drop in hemoglobin of close to 5 g from baseline.  CT angiogram shows no active bleeding but shows sigmoid diverticulosis.  Differentials for the bleeding include diverticular bleed versus bleeding from radiation proctitis.  Ideally would require for the INR to be less than 1.5 and being off Plavix and Coumadin for at least 3 days.  I will follow the patient the earliest we can perform a colonoscopy and possibly an endoscopy would be Monday which would be 3 days after the last dose if Dr. Servando Snare  feels comfortable otherwise it will be 5 days which would be 2 days later.  If he has radiation proctitis will require argon photocoagulation which ideally would require any blood thinners to be out of his system.  In the interim suggest to monitor CBC and transfuse as needed IV PPI.  If he  requires blood thinners from the cardiac standpoint and has no active bleeding will consider IV heparin which has a short half-life compared to Plavix and Coumadin.   Thank you for involving me in the care of this patient.      LOS: 1 day   Wyline Mood, MD  04/03/2023, 12:05 PM

## 2023-04-04 DIAGNOSIS — K922 Gastrointestinal hemorrhage, unspecified: Secondary | ICD-10-CM | POA: Diagnosis not present

## 2023-04-04 LAB — TYPE AND SCREEN
ABO/RH(D): O POS
Antibody Screen: NEGATIVE
Unit division: 0
Unit division: 0

## 2023-04-04 LAB — PREPARE FRESH FROZEN PLASMA
Unit division: 0
Unit division: 0

## 2023-04-04 LAB — BPAM RBC
Blood Product Expiration Date: 202406222359
Blood Product Expiration Date: 202406222359
ISSUE DATE / TIME: 202405180608
ISSUE DATE / TIME: 202405180904
Unit Type and Rh: 5100
Unit Type and Rh: 5100

## 2023-04-04 LAB — BPAM FFP
Blood Product Expiration Date: 202405222359
Blood Product Expiration Date: 202405222359
Unit Type and Rh: 5100
Unit Type and Rh: 5100

## 2023-04-04 LAB — PROTIME-INR
INR: 1.3 — ABNORMAL HIGH (ref 0.8–1.2)
Prothrombin Time: 16.4 seconds — ABNORMAL HIGH (ref 11.4–15.2)

## 2023-04-04 LAB — HEMOGLOBIN
Hemoglobin: 9 g/dL — ABNORMAL LOW (ref 13.0–17.0)
Hemoglobin: 9.5 g/dL — ABNORMAL LOW (ref 13.0–17.0)
Hemoglobin: 9.2 g/dL — ABNORMAL LOW (ref 13.0–17.0)

## 2023-04-04 LAB — GLUCOSE, CAPILLARY
Glucose-Capillary: 90 mg/dL (ref 70–99)
Glucose-Capillary: 102 mg/dL — ABNORMAL HIGH (ref 70–99)
Glucose-Capillary: 103 mg/dL — ABNORMAL HIGH (ref 70–99)
Glucose-Capillary: 83 mg/dL (ref 70–99)
Glucose-Capillary: 100 mg/dL — ABNORMAL HIGH (ref 70–99)

## 2023-04-04 MED ORDER — ORAL CARE MOUTH RINSE: 15.0000 mL | OROMUCOSAL | Status: DC | PRN

## 2023-04-04 NOTE — Progress Notes (Signed)
PROGRESS NOTE    [name] [name]  UJW:[id number] DOB: 1946-01-24 DOA: 04/02/2023 PCP: Lynnea Ferrier, MD    Brief Narrative:  77 y.o. 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  Patient was in his usual state of health till yesterday evening.  Since getting up this morning patient has had 4 loose bowel movements that have become increasingly maroon-colored.  Last bowel movement was approximately 2 PM.  Patient has shown a photo of the last bowel movement in the bowel with some clot and maroon-colored.  Patient had no vomiting no pain no fever.  No presyncope no other site of bleeding such as gum bleeding nosebleeding or skin bruising.  Patient came to the ER for further evaluation.   Received 2 units PRBC in total.  Hemoglobin low of 7.7.  Increased to 10.2.  Drop subsequently to 9.0.   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  Both his medications were taken 1 day prior to presentation.  Presented with hemoglobin of 10.8.  Patient endorses initially feeling unwell followed by dark stools then bright red blood with clots.  Hemoglobin on presentation 10.8.  Dropped to 7.7.  2 units PRBC ordered.  Patient received vitamin K 5 mg IV x 1 and FFP on arrival.  CT angio negative Plan: Hold aspirin, Plavix, warfarin Daily INR Continue twice daily PPI IV Trend hemoglobin every 8 hours Transfuse PRBC hemoglobin less than 8 N.p.o. after midnight.  Tentative plan for endoscopic evaluation 5/20  Coronary artery  disease History of NSTEMI No chest pain on arrival.  No troponins checked.  Patient had NSTEMI earlier this year.  Placed on medical management with aspirin and Plavix. Plan: Hold aspirin and Plavix Cardiology on consult  History of DVT, recurrent Patient maintained on warfarin due to previous episodes of bleeding.  INR on presentation 2.5.  Coagulopathy reversed with vitamin K and FFP.  Current INR 1.3.  Continue daily INR.  Postural dizziness with presyncope Likely intravascular volume depletion in the setting of GI bleed.  No evidence of hemodynamic instability.  Will maintain on intravenous fluids.   DVT prophylaxis: SCDs Code Status: Full Family Communication: Spouse at bedside 5/18, 5/19 Disposition Plan: Status is: Inpatient Remains inpatient appropriate because: GI bleed   Level of care: Progressive  Consultants:  GI  Procedures:  None  Antimicrobials: None   Subjective: Seen and examined.  Feeling much better today.  Fatigue weakness improving  Objective: Vitals:   04/03/23 1914 04/03/23 2304 04/04/23 0313 04/04/23 0803  BP: (!) 125/49 (!) 128/45 (!) 126/52 (!) 134/57  Pulse: 76 65 66 69  Resp: 17 16 17 16   Temp: 98.3 F (36.8 C) 98.3 F (36.8 C) 98.2 F (36.8 C) 98.1 F (36.7 C)  TempSrc:    Oral  SpO2: 96% 97% 95% 96%  Weight:      Height:        Intake/Output Summary (Last 24 hours) at 04/04/2023 1020 Last data filed at 04/04/2023 1004 Gross per  24 hour  Intake 3546.71 ml  Output 2445 ml  Net 1101.71 ml   Filed Weights   04/02/23 1657 04/02/23 1710 04/03/23 0015  Weight: 91.2 kg 91 kg 89.6 kg    Examination:  General exam: No acute distress.  Admits periods Respiratory system: Lungs clear.  Normal work of breathing.  Room air Cardiovascular system: S1-S2, RRR, no murmurs, no pedal edema Gastrointestinal system: Soft, NT/ND, normal bowel sounds Central nervous system: Alert and oriented. No focal neurological deficits. Extremities:  Symmetric 5 x 5 power. Skin: No rashes, lesions or ulcers Psychiatry: Judgement and insight appear normal. Mood & affect appropriate.     Data Reviewed: I have personally reviewed following labs and imaging studies  CBC: Recent Labs  Lab 04/02/23 1706 04/02/23 2243 04/03/23 0014 04/03/23 0411 04/03/23 1251 04/03/23 1657 04/04/23 0055 04/04/23 0853  WBC 5.9 7.3 9.4 5.2  --   --   --   --   HGB 10.8* 10.4* 9.1* 7.7* 10.2* 9.5* 9.0* 9.5*  HCT 33.9* 32.2* 28.1* 23.6*  --   --   --   --   MCV 93.6 93.9 92.1 91.1  --   --   --   --   PLT 235 247 207 175  --   --   --   --    Basic Metabolic Panel: Recent Labs  Lab 04/02/23 1706 04/03/23 0411  NA 138 137  K 4.6 3.8  CL 105 108  CO2 24 24  GLUCOSE 111* 95  BUN 25* 25*  CREATININE 1.11 0.95  CALCIUM 8.9 8.2*   GFR: Estimated Creatinine Clearance: 73.3 mL/min (by C-G formula based on SCr of 0.95 mg/dL). Liver Function Tests: Recent Labs  Lab 04/02/23 1706  AST 15  ALT 10  ALKPHOS 60  BILITOT 0.5  PROT 6.5  ALBUMIN 4.1   No results for input(s): "LIPASE", "AMYLASE" in the last 168 hours. No results for input(s): "AMMONIA" in the last 168 hours. Coagulation Profile: Recent Labs  Lab 04/02/23 1706 04/03/23 0411 04/04/23 0406  INR 2.5* 1.8* 1.3*   Cardiac Enzymes: No results for input(s): "CKTOTAL", "CKMB", "CKMBINDEX", "TROPONINI" in the last 168 hours. BNP (last 3 results) No results for input(s): "PROBNP" in the last 8760 hours. HbA1C: Recent Labs    04/03/23 0014  HGBA1C 5.2   CBG: Recent Labs  Lab 04/03/23 1956 04/03/23 2138 04/03/23 2305 04/04/23 0314 04/04/23 0757  GLUCAP 134* 86 84 90 102*   Lipid Profile: No results for input(s): "CHOL", "HDL", "LDLCALC", "TRIG", "CHOLHDL", "LDLDIRECT" in the last 72 hours. Thyroid Function Tests: No results for input(s): "TSH", "T4TOTAL", "FREET4", "T3FREE", "THYROIDAB" in the last 72 hours. Anemia Panel: No results for input(s): "VITAMINB12",  "FOLATE", "FERRITIN", "TIBC", "IRON", "RETICCTPCT" in the last 72 hours. Sepsis Labs: No results for input(s): "PROCALCITON", "LATICACIDVEN" in the last 168 hours.  No results found for this or any previous visit (from the past 240 hour(s)).       Radiology Studies: CT ANGIO GI BLEED  Result Date: 04/02/2023 CLINICAL DATA:  Lower GI bleed EXAM: CTA ABDOMEN AND PELVIS WITHOUT AND WITH CONTRAST TECHNIQUE: Multidetector CT imaging of the abdomen and pelvis was performed using the standard protocol during bolus administration of intravenous contrast. Multiplanar reconstructed images and MIPs were obtained and reviewed to evaluate the vascular anatomy. RADIATION DOSE REDUCTION: This exam was performed according to the departmental dose-optimization program which includes automated exposure control, adjustment of the mA and/or kV according to patient size and/or use  of iterative reconstruction technique. CONTRAST:  OMNIPAQUE IOHEXOL 350 MG/ML SOLN COMPARISON:  CTA chest abdomen and pelvis 01/30/2023 FINDINGS: VASCULAR Aorta: No aneurysm or dissection. Mixed density atherosclerotic plaque. No significant stenosis. Celiac: Calcified plaque at the origin causes mild narrowing. No aneurysm or dissection. SMA: Calcified plaque at the origin causes mild narrowing. No aneurysm or dissection. Renals: 2 right and single left renal arteries. Mild to moderate narrowing of the bilateral renal arteries secondary to atherosclerotic plaque. IMA: Patent. Inflow: Scattered atherosclerotic plaque. No significant stenosis. No aneurysm or dissection. Proximal Outflow: Patent without aneurysm or dissection. Veins: Patent portal vein and IVC. Review of the MIP images confirms the above findings. NON-VASCULAR Lower chest: No acute abnormality. Hepatobiliary: Hepatic cysts. Unremarkable gallbladder and biliary tree. Pancreas: No acute abnormality. Spleen: Unremarkable. Adrenals/Urinary Tract: Stable adrenal glands. No urinary  calculi or hydronephrosis. Unremarkable bladder. Stomach/Bowel: Sigmoid diverticulosis without evidence of diverticulitis. No evidence of active GI bleeding. No bowel wall thickening. No obstruction. Stomach is within normal limits. Lymphatic: No lymphadenopathy. Reproductive: No acute abnormality. Other: No free intraperitoneal fluid or air. Musculoskeletal: Right THA.  No acute fracture. IMPRESSION: 1. No evidence of active GI bleeding. 2. No acute abnormality in the abdomen or pelvis. 3. Sigmoid diverticulosis. Electronically Signed   By: Minerva Fester M.D.   On: 04/02/2023 23:20        Scheduled Meds:  atorvastatin  80 mg Oral Daily   carvedilol  6.25 mg Oral BID WC   [START ON 04/08/2023] Evolocumab  140 mg Subcutaneous Q14 Days   insulin aspart  0-15 Units Subcutaneous Q4H   pantoprazole (PROTONIX) IV  40 mg Intravenous Q12H   sodium chloride flush  3 mL Intravenous Q12H   tamsulosin  0.4 mg Oral Q lunch   Continuous Infusions:  lactated ringers 100 mL/hr at 04/04/23 0815     LOS: 2 days      Tresa Moore, MD [facility]   If 7PM-7AM, please contact night-coverage  04/04/2023, 10:20 AM

## 2023-04-04 NOTE — Progress Notes (Signed)
Patient requested to get out of the bed, talked to MD via secure chat if it was ok to move the patient, Md said its ok to move, assisted patient to get out of bed, patient walked independently in the room.

## 2023-04-04 NOTE — Progress Notes (Signed)
Wyline Mood , MD [address], [place], [place], [address] [address], [address], [place], [place], [address] Phone: [phone]  Fax: [phone]   [name] [name] is being followed for gi bleed  Day 1 of follow up   Subjective: No further bleeding doing well . No complaints    Objective: Vital signs in last 24 hours: Vitals:   04/03/23 1914 04/03/23 2304 04/04/23 0313 04/04/23 0803  BP: (!) 125/49 (!) 128/45 (!) 126/52 (!) 134/57  Pulse: 76 65 66 69  Resp: 17 16 17 16   Temp: 98.3 F (36.8 C) 98.3 F (36.8 C) 98.2 F (36.8 C) 98.1 F (36.7 C)  TempSrc:    Oral  SpO2: 96% 97% 95% 96%  Weight:      Height:       Weight change:   Intake/Output Summary (Last 24 hours) at 04/04/2023 0854 Last data filed at 04/04/2023 0559 Gross per 24 hour  Intake 3586.71 ml  Output 2245 ml  Net 1341.71 ml     Exam: Neuro : moving all 4 limbs no gross deficit    Lab Results: @LABTEST2 @ Micro Results: No results found for this or any previous visit (from the past 240 hour(s)). Studies/Results: CT ANGIO GI BLEED  Result Date: 04/02/2023 CLINICAL DATA:  Lower GI bleed EXAM: CTA ABDOMEN AND PELVIS WITHOUT AND WITH CONTRAST TECHNIQUE: Multidetector CT imaging of the abdomen and pelvis was performed using the standard protocol during bolus administration of intravenous contrast. Multiplanar reconstructed images and MIPs were obtained and reviewed to evaluate the vascular anatomy. RADIATION DOSE REDUCTION: This exam was performed according to the departmental dose-optimization program which includes automated exposure control, adjustment of the mA and/or kV according to patient size and/or use of iterative reconstruction technique. CONTRAST:  OMNIPAQUE IOHEXOL 350 MG/ML SOLN COMPARISON:  CTA chest abdomen and pelvis 01/30/2023 FINDINGS: VASCULAR Aorta: No aneurysm or dissection. Mixed density atherosclerotic plaque. No significant stenosis. Celiac: Calcified plaque at the  origin causes mild narrowing. No aneurysm or dissection. SMA: Calcified plaque at the origin causes mild narrowing. No aneurysm or dissection. Renals: 2 right and single left renal arteries. Mild to moderate narrowing of the bilateral renal arteries secondary to atherosclerotic plaque. IMA: Patent. Inflow: Scattered atherosclerotic plaque. No significant stenosis. No aneurysm or dissection. Proximal Outflow: Patent without aneurysm or dissection. Veins: Patent portal vein and IVC. Review of the MIP images confirms the above findings. NON-VASCULAR Lower chest: No acute abnormality. Hepatobiliary: Hepatic cysts. Unremarkable gallbladder and biliary tree. Pancreas: No acute abnormality. Spleen: Unremarkable. Adrenals/Urinary Tract: Stable adrenal glands. No urinary calculi or hydronephrosis. Unremarkable bladder. Stomach/Bowel: Sigmoid diverticulosis without evidence of diverticulitis. No evidence of active GI bleeding. No bowel wall thickening. No obstruction. Stomach is within normal limits. Lymphatic: No lymphadenopathy. Reproductive: No acute abnormality. Other: No free intraperitoneal fluid or air. Musculoskeletal: Right THA.  No acute fracture. IMPRESSION: 1. No evidence of active GI bleeding. 2. No acute abnormality in the abdomen or pelvis. 3. Sigmoid diverticulosis. Electronically Signed   By: Minerva Fester M.D.   On: 04/02/2023 23:20   Medications: I have reviewed the patient's current medications. Scheduled Meds:  atorvastatin  80 mg Oral Daily   carvedilol  6.25 mg Oral BID WC   [START ON 04/08/2023] Evolocumab  140 mg Subcutaneous Q14 Days   insulin aspart  0-15 Units Subcutaneous Q4H   pantoprazole (PROTONIX) IV  40 mg Intravenous Q12H   sodium chloride flush  3 mL Intravenous Q12H  tamsulosin  0.4 mg Oral Q lunch   Continuous Infusions:  lactated ringers 100 mL/hr at 04/04/23 0815   PRN Meds:.acetaminophen **OR** acetaminophen, ondansetron (ZOFRAN) IV, mouth rinse     Latest Ref Rng &  Units 04/04/2023   12:55 AM 04/03/2023    4:57 PM 04/03/2023   12:51 PM  CBC  Hemoglobin 13.0 - 17.0 g/dL 9.0  9.5  16.1     Assessment: Principal Problem:   Lower GI bleeding Active Problems:   History of DVT (deep vein thrombosis)   Radiation proctitis   Blood loss anemia   NSTEMI (non-ST elevated myocardial infarction) (HCC)   Postural dizziness with presyncope  [name] [name] is a 77 y.o. y/o male with a history of prostate cancer with history of radiation proctitis last treated by Dr. Allegra Lai in 2020 with argon photocoagulation.  Recent NSTEMI with drug-eluting stent placed in March 2024 on Plavix and Coumadin, history of recurrent DVTs.  Presented to the emergency room with bloody bowel movements.  Drop in hemoglobin of close to 5 g from baseline.  CT angiogram shows no active bleeding but shows sigmoid diverticulosis.  Differentials for the bleeding include diverticular bleed versus bleeding from radiation proctitis.   Discussed with Dr Servando Snare ,he will be  on call from tomorrow. Will set him up for EGD+colonoscopy 5 days off plavix. Last dose was on Thursday night . In interim monitor CBC and transfuse if needed. IV PPI    LOS: 2 days   Wyline Mood, MD 04/04/2023, 8:54 AM

## 2023-04-04 NOTE — Progress Notes (Deleted)
[facility] Cardiology    SUBJECTIVE: Patient states he feels reasonably well no evidence of active bleeding no chest pain no palpitation tachycardia feels reasonably well   Vitals:   04/03/23 1914 04/03/23 2304 04/04/23 0313 04/04/23 0803  BP: (!) 125/49 (!) 128/45 (!) 126/52 (!) 134/57  Pulse: 76 65 66 69  Resp: 17 16 17 16   Temp: 98.3 F (36.8 C) 98.3 F (36.8 C) 98.2 F (36.8 C) 98.1 F (36.7 C)  TempSrc:    Oral  SpO2: 96% 97% 95% 96%  Weight:      Height:         Intake/Output Summary (Last 24 hours) at 04/04/2023 1029 Last data filed at 04/04/2023 1004 Gross per 24 hour  Intake 3546.71 ml  Output 2445 ml  Net 1101.71 ml      PHYSICAL EXAM  General: Well developed, well nourished, in no acute distress HEENT:  Normocephalic and atramatic Neck:  No JVD.  Lungs: Clear bilaterally to auscultation and percussion. Heart: HRRR . Normal S1 and S2 without gallops or murmurs.  Abdomen: Bowel sounds are positive, abdomen soft and non-tender  Msk:  Back normal, normal gait. Normal strength and tone for age. Extremities: No clubbing, cyanosis or edema.   Neuro: Alert and oriented X 3. Psych:  Good affect, responds appropriately   LABS: Basic Metabolic Panel: Recent Labs    04/02/23 1706 04/03/23 0411  NA 138 137  K 4.6 3.8  CL 105 108  CO2 24 24  GLUCOSE 111* 95  BUN 25* 25*  CREATININE 1.11 0.95  CALCIUM 8.9 8.2*   Liver Function Tests: Recent Labs    04/02/23 1706  AST 15  ALT 10  ALKPHOS 60  BILITOT 0.5  PROT 6.5  ALBUMIN 4.1   No results for input(s): "LIPASE", "AMYLASE" in the last 72 hours. CBC: Recent Labs    04/03/23 0014 04/03/23 0411 04/03/23 1251 04/04/23 0055 04/04/23 0853  WBC 9.4 5.2  --   --   --   HGB 9.1* 7.7*   < > 9.0* 9.5*  HCT 28.1* 23.6*  --   --   --   MCV 92.1 91.1  --   --   --   PLT 207 175  --   --   --    < > = values in this interval not displayed.   Cardiac Enzymes: No results for input(s): "CKTOTAL", "CKMB",  "CKMBINDEX", "TROPONINI" in the last 72 hours. BNP: Invalid input(s): "POCBNP" D-Dimer: No results for input(s): "DDIMER" in the last 72 hours. Hemoglobin A1C: Recent Labs    04/03/23 0014  HGBA1C 5.2   Fasting Lipid Panel: No results for input(s): "CHOL", "HDL", "LDLCALC", "TRIG", "CHOLHDL", "LDLDIRECT" in the last 72 hours. Thyroid Function Tests: No results for input(s): "TSH", "T4TOTAL", "T3FREE", "THYROIDAB" in the last 72 hours.  Invalid input(s): "FREET3" Anemia Panel: No results for input(s): "VITAMINB12", "FOLATE", "FERRITIN", "TIBC", "IRON", "RETICCTPCT" in the last 72 hours.  CT ANGIO GI BLEED  Result Date: 04/02/2023 CLINICAL DATA:  Lower GI bleed EXAM: CTA ABDOMEN AND PELVIS WITHOUT AND WITH CONTRAST TECHNIQUE: Multidetector CT imaging of the abdomen and pelvis was performed using the standard protocol during bolus administration of intravenous contrast. Multiplanar reconstructed images and MIPs were obtained and reviewed to evaluate the vascular anatomy. RADIATION DOSE REDUCTION: This exam was performed according to the departmental dose-optimization program which includes automated exposure control, adjustment of the mA and/or kV according to patient size and/or use of iterative reconstruction technique. CONTRAST:  OMNIPAQUE IOHEXOL 350 MG/ML SOLN COMPARISON:  CTA chest abdomen and pelvis 01/30/2023 FINDINGS: VASCULAR Aorta: No aneurysm or dissection. Mixed density atherosclerotic plaque. No significant stenosis. Celiac: Calcified plaque at the origin causes mild narrowing. No aneurysm or dissection. SMA: Calcified plaque at the origin causes mild narrowing. No aneurysm or dissection. Renals: 2 right and single left renal arteries. Mild to moderate narrowing of the bilateral renal arteries secondary to atherosclerotic plaque. IMA: Patent. Inflow: Scattered atherosclerotic plaque. No significant stenosis. No aneurysm or dissection. Proximal Outflow: Patent without aneurysm  or dissection. Veins: Patent portal vein and IVC. Review of the MIP images confirms the above findings. NON-VASCULAR Lower chest: No acute abnormality. Hepatobiliary: Hepatic cysts. Unremarkable gallbladder and biliary tree. Pancreas: No acute abnormality. Spleen: Unremarkable. Adrenals/Urinary Tract: Stable adrenal glands. No urinary calculi or hydronephrosis. Unremarkable bladder. Stomach/Bowel: Sigmoid diverticulosis without evidence of diverticulitis. No evidence of active GI bleeding. No bowel wall thickening. No obstruction. Stomach is within normal limits. Lymphatic: No lymphadenopathy. Reproductive: No acute abnormality. Other: No free intraperitoneal fluid or air. Musculoskeletal: Right THA.  No acute fracture. IMPRESSION: 1. No evidence of active GI bleeding. 2. No acute abnormality in the abdomen or pelvis. 3. Sigmoid diverticulosis. Electronically Signed   By: Minerva Fester M.D.   On: 04/02/2023 23:20     Echo mild reduced left ventricular function EF around 30 to 35%  TELEMETRY: Normal sinus rhythm rate of 75 nonspecific CHF changes:  ASSESSMENT AND PLAN:  Principal Problem:   Lower GI bleeding Active Problems:   History of DVT (deep vein thrombosis)   Radiation proctitis   Blood loss anemia   NSTEMI (non-ST elevated myocardial infarction) (HCC)   Postural dizziness with presyncope    Plan Continue telemetry follow-up EKGs troponins Continue to follow H&H for no further bleeding Await  GI scope tomorrow EGD/colonoscopy for source of bleeding Continue to hold anticoagulation Coumadin Plavix for now Previous history of non-STEMI PCI and stent to LAD was on antiplatelets or anticoagulation but with bleeding they both been held History of DVT was on anticoagulation but being held because of the bleeding Continue conservative cardiac evaluation treatment Will await GIs clearance to restart Plavix therapy post PCI Advised the patient to refer back to GI ,hematology or primary  physician about time course for resuming Coumadin   Alwyn Pea, MD,  04/04/2023 10:29 AM

## 2023-04-05 DIAGNOSIS — D500 Iron deficiency anemia secondary to blood loss (chronic): Secondary | ICD-10-CM | POA: Diagnosis not present

## 2023-04-05 DIAGNOSIS — K922 Gastrointestinal hemorrhage, unspecified: Secondary | ICD-10-CM | POA: Diagnosis not present

## 2023-04-05 LAB — GLUCOSE, CAPILLARY
Glucose-Capillary: 101 mg/dL — ABNORMAL HIGH (ref 70–99)
Glucose-Capillary: 104 mg/dL — ABNORMAL HIGH (ref 70–99)
Glucose-Capillary: 138 mg/dL — ABNORMAL HIGH (ref 70–99)
Glucose-Capillary: 99 mg/dL (ref 70–99)

## 2023-04-05 LAB — BASIC METABOLIC PANEL
Anion gap: 5 (ref 5–15)
BUN: 14 mg/dL (ref 8–23)
CO2: 25 mmol/L (ref 22–32)
Calcium: 8.6 mg/dL — ABNORMAL LOW (ref 8.9–10.3)
Chloride: 111 mmol/L (ref 98–111)
Creatinine, Ser: 1 mg/dL (ref 0.61–1.24)
GFR, Estimated: >60 mL/min (ref >60)
Glucose, Bld: 104 mg/dL — ABNORMAL HIGH (ref 70–99)
Potassium: 3.7 mmol/L (ref 3.5–5.1)
Sodium: 141 mmol/L (ref 135–145)

## 2023-04-05 LAB — HEMOGLOBIN
Hemoglobin: 9.2 g/dL — ABNORMAL LOW (ref 13.0–17.0)
Hemoglobin: 9.6 g/dL — ABNORMAL LOW (ref 13.0–17.0)
Hemoglobin: 10.1 g/dL — ABNORMAL LOW (ref 13.0–17.0)

## 2023-04-05 LAB — IRON AND TIBC
Iron: 20 ug/dL — ABNORMAL LOW (ref 45–182)
Saturation Ratios: 7 % — ABNORMAL LOW (ref 17.9–39.5)
TIBC: 298 ug/dL (ref 250–450)
UIBC: 278 ug/dL

## 2023-04-05 LAB — PROTIME-INR
INR: 1.3 — ABNORMAL HIGH (ref 0.8–1.2)
Prothrombin Time: 15.9 seconds — ABNORMAL HIGH (ref 11.4–15.2)

## 2023-04-05 LAB — FERRITIN: Ferritin: 28 ng/mL (ref 24–336)

## 2023-04-05 MED ORDER — SODIUM CHLORIDE 0.9 % IV SOLN: INTRAVENOUS | Status: DC

## 2023-04-05 MED ORDER — PEG 3350-KCL-NA BICARB-NACL 420 G PO SOLR
4000.0000 mL | Freq: Once | ORAL | Status: AC
  Administered 2023-04-05: 4000 mL via ORAL
  Filled 2023-04-05: qty 4000

## 2023-04-05 MED ORDER — SODIUM CHLORIDE 0.9 % IV SOLN
300.0000 mg | Freq: Once | INTRAVENOUS | Status: AC
  Administered 2023-04-05: 300 mg via INTRAVENOUS
  Filled 2023-04-05: qty 300

## 2023-04-05 NOTE — Care Management Important Message (Signed)
Important Message  Patient Details  Name: [name] [name] MRN: [id number] Date of Birth: Mar 16, 1946   Medicare Important Message Given:  Yes     [name] 04/05/2023, 11:33 AM

## 2023-04-05 NOTE — Progress Notes (Signed)
[facility] CLINIC CARDIOLOGY PROGRESS NOTE   Patient ID: [name] [name] MRN: [id number] DOB/AGE: 06-29-1946 77 y.o.  Admit date: 04/02/2023 Referring Physician: Dr. Nolberto Hanlon Primary Physician: Dr. Daniel Nones Primary Cardiologist: Dr. Dorothyann Peng Reason for Consultation: GI bleed, recent NSTEMI  HPI: [name] [name] is a 77 y.o. male who presented to the ED on 04/02/2023 for rectal bleeding. They have a past medical history significant for CAD s/p PCI and DES to LAD 01/2023, recurrent DVT on chronic anticoagulation, hypertension, hyperlipidemia, anemia, prostate cancer treated with radiation, GERD. He was admitted in March for NSTEMI and underwent LHC with PCI and DES to LAD.  He was discharged home on aspirin 81 mg daily and Plavix 75 mg daily in addition to his warfarin. He presented to [facility] ED on 04/02/2023 for concerns of rectal bleeding x 1 day.  Hemoglobin in the ED noted to be 10.8 down from 12.3 2 months prior.  He was given FFP and vitamin K in the ED and anticoagulation has been held since admission.  Interval History:  -Patient reports he feels somewhat better today. Has been walking around the unit. Denies chest pain, shortness of breath.  -Reports he had a BM this AM that was bloody.  -Patient states that he realized his last dose of Plavix was actually on Friday 04/02/2023.   Review of systems complete and found to be negative unless listed above    Vitals:   04/05/23 0008 04/05/23 0323 04/05/23 0751 04/05/23 1128  BP: (!) 150/58 (!) 137/57 (!) 146/70 (!) 132/51  Pulse: 68 61 66 66  Resp: 17 14 18 18   Temp: 98 F (36.7 C) 97.9 F (36.6 C) 98.2 F (36.8 C) 97.9 F (36.6 C)  TempSrc:   Oral Oral  SpO2: 96% 97% 97% 97%  Weight:      Height:         Intake/Output Summary (Last 24 hours) at 04/05/2023 1314 Last data filed at 04/05/2023 0300 Gross per 24 hour  Intake 240 ml  Output 2360 ml  Net -2120 ml     PHYSICAL EXAM General: Elderly male sitting upright in hospital  bed, well nourished, in no acute distress with wife at bedside. HEENT: Normocephalic and atraumatic. Neck: No JVD.  Lungs: Normal respiratory effort on RA. Clear bilaterally to auscultation. No wheezes, crackles, rhonchi.  Heart: HRRR. Normal S1 and S2 without gallops or murmurs. Radial & DP pulses 2+ bilaterally. Abdomen: Non-distended appearing.  Msk: Normal strength and tone for age. Extremities: No clubbing, cyanosis or edema.   Neuro: Alert and oriented X 3. Psych: Mood appropriate, affect congruent.    LABS: Basic Metabolic Panel: Recent Labs    04/03/23 0411 04/05/23 0838  NA 137 141  K 3.8 3.7  CL 108 111  CO2 24 25  GLUCOSE 95 104*  BUN 25* 14  CREATININE 0.95 1.00  CALCIUM 8.2* 8.6*   Liver Function Tests: Recent Labs    04/02/23 1706  AST 15  ALT 10  ALKPHOS 60  BILITOT 0.5  PROT 6.5  ALBUMIN 4.1   No results for input(s): "LIPASE", "AMYLASE" in the last 72 hours. CBC: Recent Labs    04/03/23 0014 04/03/23 0411 04/03/23 1251 04/05/23 0112 04/05/23 0838  WBC 9.4 5.2  --   --   --   HGB 9.1* 7.7*   < > 9.2* 9.6*  HCT 28.1* 23.6*  --   --   --   MCV 92.1 91.1  --   --   --  PLT 207 175  --   --   --    < > = values in this interval not displayed.   Cardiac Enzymes: No results for input(s): "CKTOTAL", "CKMB", "CKMBINDEX", "TROPONINIHS" in the last 72 hours. BNP: No results for input(s): "BNP" in the last 72 hours. D-Dimer: No results for input(s): "DDIMER" in the last 72 hours. Hemoglobin A1C: Recent Labs    04/03/23 0014  HGBA1C 5.2   Fasting Lipid Panel: No results for input(s): "CHOL", "HDL", "LDLCALC", "TRIG", "CHOLHDL", "LDLDIRECT" in the last 72 hours. Thyroid Function Tests: No results for input(s): "TSH", "T4TOTAL", "T3FREE", "THYROIDAB" in the last 72 hours.  Invalid input(s): "FREET3" Anemia Panel: No results for input(s): "VITAMINB12", "FOLATE", "FERRITIN", "TIBC", "IRON", "RETICCTPCT" in the last 72 hours.  No results  found.   ECHO 01/30/2023: 1. Large area of ant/apical/septal Akinesis.   2. Ant/apical/septal Akinesis. Left ventricular ejection fraction, by  estimation, is 35 to 40%. The left ventricle has moderately decreased  function. The left ventricle demonstrates regional wall motion  abnormalities (see scoring diagram/findings for  description). The left ventricular internal cavity size was mildly  dilated. There is mild concentric left ventricular hypertrophy. Left  ventricular diastolic parameters are consistent with Grade I diastolic  dysfunction (impaired relaxation).   3. Right ventricular systolic function is normal. The right ventricular  size is normal.   4. The mitral valve is normal in structure. Moderate to severe mitral  valve regurgitation.   5. The aortic valve is normal in structure. There is mild calcification  of the aortic valve. There is mild thickening of the aortic valve. Aortic  valve regurgitation is not visualized. Aortic valve sclerosis is present,  with no evidence of aortic valve  stenosis.    TELEMETRY reviewed by me 04/05/23: Sinus rhythm heart rate 60-80s  DATA reviewed by me 04/05/23: Hospitalist progress note, GI note, last 24h vitals tele labs imaging I/O   ASSESSMENT AND PLAN:  [name] [name] is a 77 y.o. male who presented to the ED on 04/02/2023 for rectal bleeding. They have a past medical history significant for CAD s/p PCI and DES to LAD 01/2023, recurrent DVT on chronic anticoagulation, hypertension, hyperlipidemia, anemia, prostate cancer treated with radiation, GERD. He was admitted in March for NSTEMI and underwent LHC with PCI and DES to LAD.  He was discharged home on aspirin 81 mg daily for 30 days and Plavix 75 mg daily in addition to his warfarin. He presented to [facility] ED on 04/02/2023 for concerns of rectal bleeding x 1 day.  Hemoglobin in the ED noted to be 10.8 down from 12.3 2 months prior.  He was given FFP and vitamin K in the ED and  anticoagulation has been held since admission.  # GI Bleed # Blood Loss Anemia Patient reports feeling better overall, did have a bloody BM this AM. After discussion patient states that his last dose of Plavix was on Friday 04/02/2023. -Hemoglobin with slight improvement today to 9.6 from 9.2. S/p FFP and Vitamin K in the ED.  -Appreciate GI recommendations, plan for EGD and colonoscopy after 5 days off Plavix. Last dose was 04/02/2023 -IV PPI, IVF  # CAD s/p PCI and DES to LAD 01/2023 # Recent NSTEMI 01/2023  # Ischemic Cardiomyopathy -Continue holding Plavix per GI recommendations. -Continue carvedilol 6.25 mg twice daily. Not currently receiving home lisinopril, spironolactone, jardiance.  -Continue Repatha injections every 2 weeks and Lipitor 80 mg daily  # Hx DVT # Chronic Anticoagulation -Continue  to hold warfarin, will defer recommendations for restarting this to primary team given his longstanding history of recurrent DVT   Principal Problem:   Lower GI bleeding Active Problems:   History of DVT (deep vein thrombosis)   Radiation proctitis   Blood loss anemia   NSTEMI (non-ST elevated myocardial infarction) (HCC)   Postural dizziness with presyncope    This patient's case was discussed and created with Dr. Juliann Pares and he is in agreement.  Marcee Jacobs [place], PA-C 04/05/2023, 1:14 PM [facility]

## 2023-04-05 NOTE — Progress Notes (Signed)
PROGRESS NOTE    [name] [name]  ZOX:[id number] DOB: 01-Oct-1946 DOA: 04/02/2023 PCP: Lynnea Ferrier, MD    Brief Narrative:  77 y.o. 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  Patient was in his usual state of health till yesterday evening.  Since getting up this morning patient has had 4 loose bowel movements that have become increasingly maroon-colored.  Last bowel movement was approximately 2 PM.  Patient has shown a photo of the last bowel movement in the bowel with some clot and maroon-colored.  Patient had no vomiting no pain no fever.  No presyncope no other site of bleeding such as gum bleeding nosebleeding or skin bruising.  Patient came to the ER for further evaluation.   Received 2 units PRBC in total.  Hemoglobin low of 7.7.  Increased to 10.2.  Drop subsequently to 9.0.   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  Both his medications were taken 1 day prior to presentation.  Presented with hemoglobin of 10.8.  Patient endorses initially feeling unwell followed by dark stools then bright red blood with clots.  Hemoglobin on presentation 10.8.  Dropped to 7.7.  2 units PRBC ordered.  Patient received vitamin K 5 mg IV x 1 and FFP on arrival.  CT angio negative Last dose of Plavix was Friday AM on 5/17 Plan: Hold aspirin, Plavix, warfarin Daily INR Continue twice daily PPI IV Trend hemoglobin every 8 hours Transfuse PRBC hemoglobin less than 8 GI To discuss timing of endoscopy  Coronary  artery disease History of NSTEMI No chest pain on arrival.  No troponins checked.  Patient had NSTEMI earlier this year.  Placed on medical management with aspirin and Plavix. Plan: Hold aspirin and Plavix Further discussion post endoscopy  History of DVT, recurrent Patient maintained on warfarin due to previous episodes of bleeding.  INR on presentation 2.5.  Coagulopathy reversed with vitamin K and FFP.  Current INR 1.3.  Continue daily INR.  Postural dizziness with presyncope Likely intravascular volume depletion in the setting of GI bleed.  No evidence of hemodynamic instability.  Will maintain on intravenous fluids.   DVT prophylaxis: SCDs Code Status: Full Family Communication: Spouse at bedside 5/18, 5/19, 5/20 Disposition Plan: Status is: Inpatient Remains inpatient appropriate because: GI bleed   Level of care: Progressive  Consultants:  GI  Procedures:  None  Antimicrobials: None   Subjective: Seen and examined.  Feels somewhat weak and fatigued this morning  Objective: Vitals:   04/04/23 1956 04/05/23 0008 04/05/23 0323 04/05/23 0751  BP: (!) 128/57 (!) 150/58 (!) 137/57 (!) 146/70  Pulse: 61 68 61 66  Resp: 17 17 14 18   Temp: 98 F (36.7 C) 98 F (36.7 C) 97.9 F (36.6 C) 98.2 F (36.8 C)  TempSrc:    Oral  SpO2: 98% 96% 97% 97%  Weight:      Height:        Intake/Output Summary (Last 24 hours) at 04/05/2023 1016 Last data  filed at 04/05/2023 0300 Gross per 24 hour  Intake 360 ml  Output 2360 ml  Net -2000 ml   Filed Weights   04/02/23 1657 04/02/23 1710 04/03/23 0015  Weight: 91.2 kg 91 kg 89.6 kg    Examination:  General exam: NAD.  Appears fatigued Respiratory system: Lungs clear.  Normal work of breathing.  Room air Cardiovascular system: S1-S2, RRR, no murmurs, no pedal edema Gastrointestinal system: Soft, nontender, nondistended, normal bowel sounds Central nervous system: Alert and oriented. No focal neurological  deficits. Extremities: Symmetric 5 x 5 power. Skin: No rashes, lesions or ulcers Psychiatry: Judgement and insight appear normal. Mood & affect appropriate.     Data Reviewed: I have personally reviewed following labs and imaging studies  CBC: Recent Labs  Lab 04/02/23 1706 04/02/23 2243 04/03/23 0014 04/03/23 0411 04/03/23 1251 04/04/23 0055 04/04/23 0853 04/04/23 1625 04/05/23 0112 04/05/23 0838  WBC 5.9 7.3 9.4 5.2  --   --   --   --   --   --   HGB 10.8* 10.4* 9.1* 7.7*   < > 9.0* 9.5* 9.2* 9.2* 9.6*  HCT 33.9* 32.2* 28.1* 23.6*  --   --   --   --   --   --   MCV 93.6 93.9 92.1 91.1  --   --   --   --   --   --   PLT 235 247 207 175  --   --   --   --   --   --    < > = values in this interval not displayed.   Basic Metabolic Panel: Recent Labs  Lab 04/02/23 1706 04/03/23 0411 04/05/23 0838  NA 138 137 141  K 4.6 3.8 3.7  CL 105 108 111  CO2 24 24 25   GLUCOSE 111* 95 104*  BUN 25* 25* 14  CREATININE 1.11 0.95 1.00  CALCIUM 8.9 8.2* 8.6*   GFR: Estimated Creatinine Clearance: 69.7 mL/min (by C-G formula based on SCr of 1 mg/dL). Liver Function Tests: Recent Labs  Lab 04/02/23 1706  AST 15  ALT 10  ALKPHOS 60  BILITOT 0.5  PROT 6.5  ALBUMIN 4.1   No results for input(s): "LIPASE", "AMYLASE" in the last 168 hours. No results for input(s): "AMMONIA" in the last 168 hours. Coagulation Profile: Recent Labs  Lab 04/02/23 1706 04/03/23 0411 04/04/23 0406 04/05/23 0112  INR 2.5* 1.8* 1.3* 1.3*   Cardiac Enzymes: No results for input(s): "CKTOTAL", "CKMB", "CKMBINDEX", "TROPONINI" in the last 168 hours. BNP (last 3 results) No results for input(s): "PROBNP" in the last 8760 hours. HbA1C: Recent Labs    04/03/23 0014  HGBA1C 5.2   CBG: Recent Labs  Lab 04/04/23 1642 04/04/23 2036 04/05/23 0033 04/05/23 0326 04/05/23 0753  GLUCAP 83 100* 99 101* 104*   Lipid Profile: No results for input(s): "CHOL", "HDL", "LDLCALC", "TRIG", "CHOLHDL",  "LDLDIRECT" in the last 72 hours. Thyroid Function Tests: No results for input(s): "TSH", "T4TOTAL", "FREET4", "T3FREE", "THYROIDAB" in the last 72 hours. Anemia Panel: No results for input(s): "VITAMINB12", "FOLATE", "FERRITIN", "TIBC", "IRON", "RETICCTPCT" in the last 72 hours. Sepsis Labs: No results for input(s): "PROCALCITON", "LATICACIDVEN" in the last 168 hours.  No results found for this or any previous visit (from the past 240 hour(s)).       Radiology Studies: No results found.      Scheduled Meds:  atorvastatin  80 mg Oral Daily   carvedilol  6.25 mg Oral BID WC   [  START ON 04/08/2023] Evolocumab  140 mg Subcutaneous Q14 Days   insulin aspart  0-15 Units Subcutaneous Q4H   pantoprazole (PROTONIX) IV  40 mg Intravenous Q12H   sodium chloride flush  3 mL Intravenous Q12H   tamsulosin  0.4 mg Oral Q lunch   Continuous Infusions:  lactated ringers 100 mL/hr at 04/05/23 0421     LOS: 3 days      Tresa Moore, MD [facility]   If 7PM-7AM, please contact night-coverage  04/05/2023, 10:16 AM

## 2023-04-05 NOTE — Progress Notes (Signed)
[name] Repress, MD [address]  [address]  [place], [place] [address]  Main: [phone]  Fax: [phone] Pager: [phone]   Subjective: Patient had a bowel movement today associated with clots.  He denies any abdominal pain.  Objective: Vital signs in last 24 hours: Vitals:   04/05/23 0323 04/05/23 0751 04/05/23 1128 04/05/23 1658  BP: (!) 137/57 (!) 146/70 (!) 132/51 (!) 138/59  Pulse: 61 66 66 60  Resp: 14 18 18 18   Temp: 97.9 F (36.6 C) 98.2 F (36.8 C) 97.9 F (36.6 C) 97.7 F (36.5 C)  TempSrc:  Oral Oral Oral  SpO2: 97% 97% 97% 93%  Weight:      Height:       Weight change:   Intake/Output Summary (Last 24 hours) at 04/05/2023 1801 Last data filed at 04/05/2023 1422 Gross per 24 hour  Intake 840 ml  Output 1720 ml  Net -880 ml     Exam: Heart:: Regular rate and rhythm, S1S2 present, or without murmur or extra heart sounds Lungs: normal and clear to auscultation Abdomen: soft, nontender, normal bowel sounds   Lab Results:    Latest Ref Rng & Units 04/05/2023    4:27 PM 04/05/2023    8:38 AM 04/05/2023    1:12 AM  CBC  Hemoglobin 13.0 - 17.0 g/dL 96.2  9.6  9.2       Latest Ref Rng & Units 04/05/2023    8:38 AM 04/03/2023    4:11 AM 04/02/2023    5:06 PM  CMP  Glucose 70 - 99 mg/dL 952  95  841   BUN 8 - 23 mg/dL 14  25  25    Creatinine 0.61 - 1.24 mg/dL 3.24  4.01  0.27   Sodium 135 - 145 mmol/L 141  137  138   Potassium 3.5 - 5.1 mmol/L 3.7  3.8  4.6   Chloride 98 - 111 mmol/L 111  108  105   CO2 22 - 32 mmol/L 25  24  24    Calcium 8.9 - 10.3 mg/dL 8.6  8.2  8.9   Total Protein 6.5 - 8.1 g/dL   6.5   Total Bilirubin 0.3 - 1.2 mg/dL   0.5   Alkaline Phos 38 - 126 U/L   60   AST 15 - 41 U/L   15   ALT 0 - 44 U/L   10     Micro Results: No results found for this or any previous visit (from the past 240 hour(s)). Studies/Results: No results found. Medications: I have reviewed the patient's current medications. Prior to  Admission:  Medications Prior to Admission  Medication Sig Dispense Refill Last Dose   acetaminophen (TYLENOL) 500 MG tablet Take 1,000 mg by mouth at bedtime as needed.   prn at unk   carvedilol (COREG) 6.25 MG tablet Take 1 tablet (6.25 mg total) by mouth 2 (two) times daily with a meal. 60 tablet 3 04/02/2023   Cyanocobalamin 2500 MCG SUBL Take 2,500 mcg by mouth daily. Takes at 11 am   04/02/2023   empagliflozin (JARDIANCE) 10 MG TABS tablet Take 1 tablet by mouth daily.   04/02/2023   Evolocumab 140 MG/ML SOAJ Inject 140 mg into the skin. Inject 140 mg subcutaneously every 14 (fourteen) days   03/25/2023   Glucosamine-Chondroitin (OSTEO BI-FLEX REGULAR STRENGTH PO) Take 2 tablets by mouth daily. Takes at 11 am   04/02/2023   ipratropium (ATROVENT) 0.03 % nasal spray Place 1  spray into the nose daily.   Past Week   lisinopril (ZESTRIL) 5 MG tablet Take 5 mg by mouth daily.   04/02/2023   nitroGLYCERIN (NITROSTAT) 0.4 MG SL tablet Place 1 tablet (0.4 mg total) under the tongue every 5 (five) minutes as needed for up to 1 dose for chest pain. 1 tablet 0 prn at unk   pantoprazole (PROTONIX) 40 MG tablet Take 40 mg by mouth daily.   04/02/2023   tamsulosin (FLOMAX) 0.4 MG CAPS capsule Take 0.4 mg by mouth daily with lunch.   04/02/2023   warfarin (COUMADIN) 3 MG tablet Take 3 mg by mouth as directed. Takes on Mondays, Tuesday, Wednesdays, Thursday and Fridays   04/02/2023   [EXPIRED] aspirin 81 MG chewable tablet Chew 1 tablet (81 mg total) by mouth daily. (Patient not taking: Reported on 04/02/2023) 30 tablet 1 Not Taking   atorvastatin (LIPITOR) 80 MG tablet Take 1 tablet (80 mg total) by mouth daily for 60 doses. (Patient not taking: Reported on 04/02/2023) 30 tablet 1 Not Taking   [EXPIRED] clopidogrel (PLAVIX) 75 MG tablet Take 1 tablet (75 mg total) by mouth daily with breakfast. (Patient not taking: Reported on 04/02/2023) 30 tablet 1 Not Taking   lisinopril (ZESTRIL) 2.5 MG tablet Take 2 tablets (5 mg  total) by mouth daily for 60 doses. (Patient not taking: Reported on 04/02/2023) 60 tablet 1 Not Taking   ondansetron (ZOFRAN-ODT) 4 MG disintegrating tablet Take 1 tablet (4 mg total) by mouth every 8 (eight) hours as needed for nausea or vomiting. (Patient not taking: Reported on 01/30/2023) 20 tablet 0 Not Taking   oxyCODONE-acetaminophen (PERCOCET) 5-325 MG tablet Take 1 tablet by mouth every 4 (four) hours as needed. (Patient not taking: Reported on 01/30/2023) 12 tablet 0 Not Taking   phosphorus (K PHOS NEUTRAL) 155-852-130 MG tablet Take 1 tablet (250 mg total) by mouth 3 (three) times daily. (Patient not taking: Reported on 01/30/2023) 6 tablet 0 Not Taking   pravastatin (PRAVACHOL) 10 MG tablet Take 10 mg by mouth daily. (Patient not taking: Reported on 04/02/2023)   Not Taking   spironolactone (ALDACTONE) 25 MG tablet Take 0.5 tablets (12.5 mg total) by mouth daily. (Patient not taking: Reported on 04/02/2023) 15 tablet 1 Not Taking   triamterene-hydrochlorothiazide (MAXZIDE-25) 37.5-25 MG tablet Take 0.5 tablets by mouth daily with lunch. Takes at 11 am (Patient not taking: Reported on 04/02/2023)   Not Taking   warfarin (COUMADIN) 4 MG tablet Take 4 mg by mouth as directed. Takes on Sat and Sun.   03/28/2023   Scheduled:  atorvastatin  80 mg Oral Daily   carvedilol  6.25 mg Oral BID WC   [START ON 04/08/2023] Evolocumab  140 mg Subcutaneous Q14 Days   pantoprazole (PROTONIX) IV  40 mg Intravenous Q12H   polyethylene glycol-electrolytes  4,000 mL Oral Once   sodium chloride flush  3 mL Intravenous Q12H   tamsulosin  0.4 mg Oral Q lunch   Continuous:  sodium chloride Stopped (04/05/23 1640)   iron sucrose     lactated ringers 100 mL/hr at 04/05/23 0421   EAV:[id number] **OR** acetaminophen, ondansetron (ZOFRAN) IV, mouth rinse Anti-infectives (From admission, onward)    None      Scheduled Meds:  atorvastatin  80 mg Oral Daily   carvedilol  6.25 mg Oral BID WC   [START ON  04/08/2023] Evolocumab  140 mg Subcutaneous Q14 Days   pantoprazole (PROTONIX) IV  40 mg Intravenous Q12H   polyethylene  glycol-electrolytes  4,000 mL Oral Once   sodium chloride flush  3 mL Intravenous Q12H   tamsulosin  0.4 mg Oral Q lunch   Continuous Infusions:  sodium chloride Stopped (04/05/23 1640)   iron sucrose     lactated ringers 100 mL/hr at 04/05/23 0421   PRN Meds:.acetaminophen **OR** acetaminophen, ondansetron (ZOFRAN) IV, mouth rinse   Assessment: Principal Problem:   Lower GI bleeding Active Problems:   History of DVT (deep vein thrombosis)   Radiation proctitis   Blood loss anemia   NSTEMI (non-ST elevated myocardial infarction) (HCC)   Postural dizziness with presyncope  77 year old male with 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  His hemoglobin has been stable since holding anticoagulation and antiplatelet therapy.  Bleeding has slowed down.  Colonoscopy in 06/2019 revealed radiation proctitis, s/p APC  Plan:  Hematochezia with acute blood loss anemia in setting of anticoagulation and antiplatelet therapy INR is 1.3 Last dose of Plavix on Friday 5/17 Hemoglobin has been stable, s/p 2 units of PRBCs, bleeding has significantly improved Discussed with patient regarding colonoscopy tomorrow afternoon for further evaluation Clear liquid diet today N.p.o. effective a.m. tomorrow Bowel prep today Patient will be at risk for recurrent bleeding from radiation proctitis on antiplatelet therapy and anticoagulation  I have discussed alternative options, risks & benefits,  which include, but are not limited to, bleeding, infection, perforation,respiratory complication & drug reaction.  The patient agrees with this plan &  written consent will be obtained.     LOS: 3 days   Prisma Decarlo 04/05/2023, 6:01 PM

## 2023-04-06 ENCOUNTER — Encounter: Payer: Self-pay | Admitting: Internal Medicine

## 2023-04-06 ENCOUNTER — Inpatient Hospital Stay: Payer: Medicare HMO | Admitting: Anesthesiology

## 2023-04-06 ENCOUNTER — Encounter
Admission: EM | Disposition: A | Discharge status: Home / Self Care | Payer: Self-pay | Source: Home / Self Care | Attending: Internal Medicine

## 2023-04-06 DIAGNOSIS — K922 Gastrointestinal hemorrhage, unspecified: Secondary | ICD-10-CM | POA: Diagnosis not present

## 2023-04-06 DIAGNOSIS — K627 Radiation proctitis: Secondary | ICD-10-CM | POA: Diagnosis not present

## 2023-04-06 DIAGNOSIS — D500 Iron deficiency anemia secondary to blood loss (chronic): Secondary | ICD-10-CM | POA: Diagnosis not present

## 2023-04-06 HISTORY — PX: COLONOSCOPY WITH PROPOFOL: SHX5780

## 2023-04-06 LAB — HEMOGLOBIN
Hemoglobin: 10.8 g/dL — ABNORMAL LOW (ref 13.0–17.0)
Hemoglobin: 9.2 g/dL — ABNORMAL LOW (ref 13.0–17.0)
Hemoglobin: 9.4 g/dL — ABNORMAL LOW (ref 13.0–17.0)

## 2023-04-06 LAB — PROTIME-INR
INR: 1.3 — ABNORMAL HIGH (ref 0.8–1.2)
Prothrombin Time: 16.2 seconds — ABNORMAL HIGH (ref 11.4–15.2)

## 2023-04-06 LAB — VITAMIN B12: Vitamin B-12: 1122 pg/mL — ABNORMAL HIGH (ref 180–914)

## 2023-04-06 SURGERY — COLONOSCOPY WITH PROPOFOL
Anesthesia: General

## 2023-04-06 MED ORDER — PROPOFOL 10 MG/ML IV BOLUS
INTRAVENOUS | Status: AC
  Filled 2023-04-06: qty 40

## 2023-04-06 MED ORDER — PROPOFOL 10 MG/ML IV BOLUS
INTRAVENOUS | Status: DC | PRN
  Administered 2023-04-06: 60 mg via INTRAVENOUS

## 2023-04-06 MED ORDER — LIDOCAINE HCL (CARDIAC) PF 100 MG/5ML IV SOSY
PREFILLED_SYRINGE | INTRAVENOUS | Status: DC | PRN
  Administered 2023-04-06: 30 mg via INTRAVENOUS

## 2023-04-06 MED ORDER — CLOPIDOGREL BISULFATE 75 MG PO TABS
75.0000 mg | ORAL_TABLET | Freq: Every day | ORAL | Status: DC
  Administered 2023-04-06 – 2023-04-08 (×3): 75 mg via ORAL
  Filled 2023-04-06 (×3): qty 1

## 2023-04-06 MED ORDER — SODIUM CHLORIDE 0.9 % IV SOLN: INTRAVENOUS | Status: DC

## 2023-04-06 MED ORDER — CLOPIDOGREL BISULFATE 75 MG PO TABS: 75.0000 mg | ORAL_TABLET | Freq: Every day | ORAL | Status: DC

## 2023-04-06 MED ORDER — PROPOFOL 500 MG/50ML IV EMUL
INTRAVENOUS | Status: DC | PRN
  Administered 2023-04-06: 125 ug/kg/min via INTRAVENOUS

## 2023-04-06 NOTE — Progress Notes (Signed)
PROGRESS NOTE    [name] [name]  MWN:[id number] DOB: March 07, 1946 DOA: 04/02/2023 PCP: Lynnea Ferrier, MD    Brief Narrative:  77 y.o. 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  Patient was in his usual state of health till yesterday evening.  Since getting up this morning patient has had 4 loose bowel movements that have become increasingly maroon-colored.  Last bowel movement was approximately 2 PM.  Patient has shown a photo of the last bowel movement in the bowel with some clot and maroon-colored.  Patient had no vomiting no pain no fever.  No presyncope no other site of bleeding such as gum bleeding nosebleeding or skin bruising.  Patient came to the ER for further evaluation.   Received 2 units PRBC in total.  Hemoglobin low of 7.7.  Increased to 10.2.  Drop subsequently to 9.0.  GI involved.  Plan for endoscopic evaluation 5/21.   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  Both his medications were taken 1 day prior to presentation.  Presented with hemoglobin of 10.8.  Patient endorses initially feeling unwell followed by dark stools then bright red blood with clots.  Hemoglobin on presentation 10.8.  Dropped to 7.7.  2 units PRBC ordered.  Patient received vitamin K 5 mg IV x 1 and FFP on arrival.  CT angio negative Last dose of Plavix was Friday AM on 5/17 Plan: Continue to hold all antiplatelet and anticoagulants including aspirin, Plavix, warfarin Twice daily PPI Daily INR Endoscopic  evaluation today Depending on results of endoscopy will need further discussion regarding resumption of anticoagulant and antiplatelet.  Patient's last known DVT was in 2006.  His stents were placed in March of this year.  Plavix will likely take precedence in the situation as we need to minimize the risk of in-stent rethrombosis.  Coronary artery disease History of NSTEMI No chest pain on arrival.  No troponins checked.  Patient had NSTEMI earlier this year.  Placed on medical management with aspirin and Plavix. Plan: Hold aspirin and Plavix Further discussion post endoscopy Resume Plavix when felt safe  History of DVT, recurrent Patient maintained on warfarin due to previous episodes of bleeding.  INR on presentation 2.5.  Coagulopathy reversed with vitamin K and FFP.   Continue daily INR.  Patient's last known DVT was in 2006.  In my opinion the importance of restarting Plavix outweighs the warfarin.  If 1 anticoagulant/antiplatelet started first I would elect this to be Plavix.  Postural dizziness with presyncope Likely intravascular volume depletion in the setting of GI bleed.  No evidence of hemodynamic instability.  Will maintain on intravenous fluids.   DVT prophylaxis: SCDs Code Status: Full Family Communication: Spouse at bedside 5/18, 5/19, 5/20, 5/21 Disposition Plan: Status is: Inpatient Remains inpatient appropriate because: GI bleed   Level of care: Progressive  Consultants:  GI  Procedures:  None  Antimicrobials: None   Subjective: Seen and examined.  Feels fatigued.  Secondary to bowel prep and being  NPO.  Objective: Vitals:   04/06/23 0735 04/06/23 1119 04/06/23 1127 04/06/23 1204  BP: (!) 144/64 (!) 133/58    Pulse: 70 61    Resp: 16 16 14 14   Temp: 98.2 F (36.8 C) 97.9 F (36.6 C)    TempSrc: Oral Oral    SpO2: 95% 96%    Weight:      Height:        Intake/Output Summary (Last 24 hours) at 04/06/2023 1220 Last data filed at 04/06/2023  0723 Gross per 24 hour  Intake 6042.41 ml  Output --  Net 6042.41 ml   Filed Weights   04/02/23 1657 04/02/23 1710 04/03/23 0015  Weight: 91.2 kg 91 kg 89.6 kg    Examination:  General exam: NAD.  Appears fatigued Respiratory system: Lungs clear.  Normal work of breathing.  Room air Cardiovascular system: S1-S2, RRR, no murmurs, no pedal edema Gastrointestinal system: Soft, nontender, nondistended, normal bowel sounds Central nervous system: Alert and oriented. No focal neurological deficits. Extremities: Symmetric 5 x 5 power. Skin: No rashes, lesions or ulcers Psychiatry: Judgement and insight appear normal. Mood & affect appropriate.     Data Reviewed: I have personally reviewed following labs and imaging studies  CBC: Recent Labs  Lab 04/02/23 1706 04/02/23 2243 04/03/23 0014 04/03/23 0411 04/03/23 1251 04/05/23 0112 04/05/23 1610 04/05/23 1627 04/06/23 0050 04/06/23 0918  WBC 5.9 7.3 9.4 5.2  --   --   --   --   --   --   HGB 10.8* 10.4* 9.1* 7.7*   < > 9.2* 9.6* 10.1* 10.8* 9.4*  HCT 33.9* 32.2* 28.1* 23.6*  --   --   --   --   --   --   MCV 93.6 93.9 92.1 91.1  --   --   --   --   --   --   PLT 235 247 207 175  --   --   --   --   --   --    < > = values in this interval not displayed.   Basic Metabolic Panel: Recent Labs  Lab 04/02/23 1706 04/03/23 0411 04/05/23 0838  NA 138 137 141  K 4.6 3.8 3.7  CL 105 108 111  CO2 24 24 25   GLUCOSE 111* 95 104*  BUN 25* 25* 14  CREATININE 1.11 0.95 1.00  CALCIUM 8.9 8.2* 8.6*   GFR: Estimated Creatinine Clearance: 69.7 mL/min (by C-G formula based on SCr of 1 mg/dL). Liver Function Tests: Recent Labs  Lab 04/02/23 1706  AST 15  ALT 10  ALKPHOS 60  BILITOT 0.5  PROT 6.5  ALBUMIN 4.1   No results for input(s): "LIPASE", "AMYLASE" in the last 168 hours. No results for input(s): "AMMONIA" in the last 168 hours. Coagulation Profile: Recent Labs  Lab 04/02/23 1706 04/03/23 0411 04/04/23 0406  04/05/23 0112 04/06/23 0304  INR 2.5* 1.8* 1.3* 1.3* 1.3*   Cardiac Enzymes: No results for input(s): "CKTOTAL", "CKMB", "CKMBINDEX", "TROPONINI" in the last 168 hours. BNP (last 3 results) No results for input(s): "PROBNP" in the last 8760 hours. HbA1C: No results for input(s): "HGBA1C" in the last 72 hours.  CBG: Recent Labs  Lab 04/04/23 2036 04/05/23 0033 04/05/23 0326 04/05/23 0753 04/05/23 1135  GLUCAP 100* 99 101* 104* 138*   Lipid Profile: No results for input(s): "CHOL", "HDL", "LDLCALC", "TRIG", "CHOLHDL", "LDLDIRECT" in the last 72 hours. Thyroid Function Tests: No results for input(s): "TSH", "T4TOTAL", "FREET4", "T3FREE", "THYROIDAB"  in the last 72 hours. Anemia Panel: Recent Labs    04/05/23 1627 04/06/23 0304  VITAMINB12  --  1,122*  FERRITIN 28  --   TIBC 298  --   IRON 20*  --    Sepsis Labs: No results for input(s): "PROCALCITON", "LATICACIDVEN" in the last 168 hours.  No results found for this or any previous visit (from the past 240 hour(s)).       Radiology Studies: No results found.      Scheduled Meds:  atorvastatin  80 mg Oral Daily   carvedilol  6.25 mg Oral BID WC   [START ON 04/08/2023] Evolocumab  140 mg Subcutaneous Q14 Days   pantoprazole (PROTONIX) IV  40 mg Intravenous Q12H   sodium chloride flush  3 mL Intravenous Q12H   tamsulosin  0.4 mg Oral Q lunch   Continuous Infusions:  sodium chloride Stopped (04/05/23 1640)   lactated ringers 100 mL/hr at 04/05/23 2051     LOS: 4 days      Tresa Moore, MD [facility]   If 7PM-7AM, please contact night-coverage  04/06/2023, 12:20 PM

## 2023-04-06 NOTE — Progress Notes (Signed)
[facility] CLINIC CARDIOLOGY PROGRESS NOTE   Patient ID: [name] [name] MRN: [id number] DOB/AGE: 1946-04-07 77 y.o.  Admit date: 04/02/2023 Referring Physician: Dr. Nolberto Hanlon Primary Physician: Dr. Daniel Nones Primary Cardiologist: Dr. Dorothyann Peng Reason for Consultation: GI bleed, recent NSTEMI  HPI: [name] [name] is a 77 y.o. male who presented to the ED on 04/02/2023 for rectal bleeding. They have a past medical history significant for CAD s/p PCI and DES to LAD 01/2023, recurrent DVT on chronic anticoagulation, hypertension, hyperlipidemia, anemia, prostate cancer treated with radiation, GERD. He was admitted in March for NSTEMI and underwent LHC with PCI and DES to LAD.  He was discharged home on aspirin 81 mg daily and Plavix 75 mg daily in addition to his warfarin. He presented to [facility] ED on 04/02/2023 for concerns of rectal bleeding x 1 day.  Hemoglobin in the ED noted to be 10.8 down from 12.3 2 months prior.  He was given FFP and vitamin K in the ED and anticoagulation has been held since admission.  Interval History:  -Patient feeling well overall this AM. Did not sleep much due to bowel prep for colonoscopy today.  -Denies recurrent bloody stools.  -Continues to feel well walking around the unit, no SOB or chest pain.   Review of systems complete and found to be negative unless listed above    Vitals:   04/05/23 1927 04/06/23 0118 04/06/23 0538 04/06/23 0735  BP: (!) 143/76 124/61 132/60 (!) 144/64  Pulse: (!) 58 64 80 70  Resp: 16 16 18 16   Temp: 97.9 F (36.6 C) 97.9 F (36.6 C) 98.6 F (37 C) 98.2 F (36.8 C)  TempSrc:   Oral Oral  SpO2: 98% 98% 96% 95%  Weight:      Height:         Intake/Output Summary (Last 24 hours) at 04/06/2023 1043 Last data filed at 04/06/2023 4782 Gross per 24 hour  Intake 6042.41 ml  Output --  Net 6042.41 ml     PHYSICAL EXAM General: Elderly male laying flat in hospital bed, well nourished, in no acute distress with wife at  bedside. HEENT: Normocephalic and atraumatic. Neck: No JVD.  Lungs: Normal respiratory effort on RA. Clear bilaterally to auscultation. No wheezes, crackles, rhonchi.  Heart: HRRR. Normal S1 and S2 without gallops or murmurs. Radial & DP pulses 2+ bilaterally. Abdomen: Non-distended appearing.  Msk: Normal strength and tone for age. Extremities: No clubbing, cyanosis or edema. Ted hose on both legs.  Neuro: Alert and oriented X 3. Psych: Mood appropriate, affect congruent.    LABS: Basic Metabolic Panel: Recent Labs    04/05/23 0838  NA 141  K 3.7  CL 111  CO2 25  GLUCOSE 104*  BUN 14  CREATININE 1.00  CALCIUM 8.6*   Liver Function Tests: No results for input(s): "AST", "ALT", "ALKPHOS", "BILITOT", "PROT", "ALBUMIN" in the last 72 hours.  No results for input(s): "LIPASE", "AMYLASE" in the last 72 hours. CBC: Recent Labs    04/06/23 0050 04/06/23 0918  HGB 10.8* 9.4*   Cardiac Enzymes: No results for input(s): "CKTOTAL", "CKMB", "CKMBINDEX", "TROPONINIHS" in the last 72 hours. BNP: No results for input(s): "BNP" in the last 72 hours. D-Dimer: No results for input(s): "DDIMER" in the last 72 hours. Hemoglobin A1C: No results for input(s): "HGBA1C" in the last 72 hours.  Fasting Lipid Panel: No results for input(s): "CHOL", "HDL", "LDLCALC", "TRIG", "CHOLHDL", "LDLDIRECT" in the last 72 hours. Thyroid Function Tests: No results for  input(s): "TSH", "T4TOTAL", "T3FREE", "THYROIDAB" in the last 72 hours.  Invalid input(s): "FREET3" Anemia Panel: Recent Labs    04/05/23 1627  FERRITIN 28  TIBC 298  IRON 20*    No results found.   ECHO 01/30/2023: 1. Large area of ant/apical/septal Akinesis.   2. Ant/apical/septal Akinesis. Left ventricular ejection fraction, by  estimation, is 35 to 40%. The left ventricle has moderately decreased  function. The left ventricle demonstrates regional wall motion  abnormalities (see scoring diagram/findings for   description). The left ventricular internal cavity size was mildly  dilated. There is mild concentric left ventricular hypertrophy. Left  ventricular diastolic parameters are consistent with Grade I diastolic  dysfunction (impaired relaxation).   3. Right ventricular systolic function is normal. The right ventricular  size is normal.   4. The mitral valve is normal in structure. Moderate to severe mitral  valve regurgitation.   5. The aortic valve is normal in structure. There is mild calcification  of the aortic valve. There is mild thickening of the aortic valve. Aortic  valve regurgitation is not visualized. Aortic valve sclerosis is present,  with no evidence of aortic valve  stenosis.    TELEMETRY reviewed by me 04/06/23: No data to review as telemetry order expired and patient taken off the monitor overnight.  New orders placed.   DATA reviewed by me 04/06/23: Hospitalist progress note, GI note, last 24h vitals tele labs imaging I/O    Principal Problem:   Lower GI bleeding Active Problems:   History of DVT (deep vein thrombosis)   Radiation proctitis   Blood loss anemia   NSTEMI (non-ST elevated myocardial infarction) (HCC)   Postural dizziness with presyncope    ASSESSMENT AND PLAN:  [name] [name] is a 77 y.o. male who presented to the ED on 04/02/2023 for rectal bleeding. They have a past medical history significant for CAD s/p PCI and DES to LAD 01/2023, recurrent DVT on chronic anticoagulation, hypertension, hyperlipidemia, anemia, prostate cancer treated with radiation, GERD. He was admitted in March for NSTEMI and underwent LHC with PCI and DES to LAD.  He was discharged home on aspirin 81 mg daily for 30 days and Plavix 75 mg daily in addition to his warfarin. He presented to [facility] ED on 04/02/2023 for concerns of rectal bleeding x 1 day.  Hemoglobin in the ED noted to be 10.8 down from 12.3 2 months prior.  He was given FFP and vitamin K in the ED and anticoagulation has  been held since admission.  # GI Bleed # Blood Loss Anemia Patient reports feeling better overall, colon prep overnight with no noted blood. Last dose of Plavix was on Friday 04/02/2023. -Hemoglobin improved to 10.8 today. S/p FFP and Vitamin K in the ED, 2 units PRBC.  -Appreciate GI recommendations, plan for colonoscopy today to evaluate for cause of bleed.  -IV PPI, IVF  # CAD s/p PCI and DES to LAD 01/2023 # Recent NSTEMI 01/2023  # Ischemic Cardiomyopathy -Continue holding Plavix per GI recommendations. Will reassess after colonoscopy.  -Continue carvedilol 6.25 mg twice daily. Not currently receiving home lisinopril, spironolactone, jardiance. Consider restarting if BP and renal function allow.  -Continue Repatha injections every 2 weeks and Lipitor 80 mg daily  # Hx DVT # Chronic Anticoagulation -Continue to hold warfarin, will defer recommendations for restarting this to primary team given his longstanding history of recurrent DVT.   This patient's case was discussed and created with Dr. Juliann Pares and he is in  agreement.  [name] Droke Linden, PA-C 04/06/2023, 10:43 AM [facility]

## 2023-04-06 NOTE — Op Note (Signed)
[facility] Gastroenterology Patient Name: [name] [name] Procedure Date: 04/06/2023 2:38 PM MRN: [id number] Account #: [id number] Date of Birth: 24-Mar-1946 Admit Type: Outpatient Age: 77 Room: [facility] ENDO ROOM 3 Gender: Male Note Status: Finalized Instrument Name: Colonoscope [id number] Procedure:             Colonoscopy Indications:           Last colonoscopy: August 2020, Hematochezia, Acute                         post hemorrhagic anemia Providers:             Toney Reil MD, MD Medicines:             General Anesthesia Complications:         No immediate complications. Estimated blood loss: None. Procedure:             Pre-Anesthesia Assessment:                        - Prior to the procedure, a History and Physical was                         performed, and patient medications and allergies were                         reviewed. The patient is competent. The risks and                         benefits of the procedure and the sedation options and                         risks were discussed with the patient. All questions                         were answered and informed consent was obtained.                         Patient identification and proposed procedure were                         verified by the physician, the nurse, the                         anesthesiologist, the anesthetist and the technician                         in the pre-procedure area in the procedure room in the                         endoscopy suite. Mental Status Examination: alert and                         oriented. Airway Examination: normal oropharyngeal                         airway and neck mobility. Respiratory Examination:                         clear to auscultation. CV Examination:  normal.                         Prophylactic Antibiotics: The patient does not require                         prophylactic antibiotics. Prior Anticoagulants: The                         patient  has taken Plavix (clopidogrel), last dose was                         4 days prior to procedure. ASA Grade Assessment: III -                         A patient with severe systemic disease. After                         reviewing the risks and benefits, the patient was                         deemed in satisfactory condition to undergo the                         procedure. The anesthesia plan was to use general                         anesthesia. Immediately prior to administration of                         medications, the patient was re-assessed for adequacy                         to receive sedatives. The heart rate, respiratory                         rate, oxygen saturations, blood pressure, adequacy of                         pulmonary ventilation, and response to care were                         monitored throughout the procedure. The physical                         status of the patient was re-assessed after the                         procedure.                        After obtaining informed consent, the colonoscope was                         passed under direct vision. Throughout the procedure,                         the patient's blood pressure, pulse, and oxygen  saturations were monitored continuously. The                         Colonoscope was introduced through the anus and                         advanced to the the cecum, identified by appendiceal                         orifice and ileocecal valve. The colonoscopy was                         performed without difficulty. The patient tolerated                         the procedure well. The quality of the bowel                         preparation was good. The ileocecal valve, appendiceal                         orifice, and rectum were photographed. Findings:      The perianal and digital rectal examinations were normal. Pertinent       negatives include normal sphincter tone and no palpable  rectal lesions.      Multiple large-mouthed diverticula were found in the recto-sigmoid colon       and sigmoid colon.      Multiple small diffuse angiodysplastic lesions without bleeding were       found in the distal rectum consistent with mild radiation proctitis.       Coagulation for hemostasis using argon plasma was successful. Estimated       blood loss: none. Impression:            - Diverticulosis in the recto-sigmoid colon and in the                         sigmoid colon.                        - Multiple non-bleeding colonic angiodysplastic                         lesions. Treated with argon plasma coagulation (APC).                        - No specimens collected. Recommendation:        - Return patient to hospital ward for ongoing care.                        - Cardiac diet today.                        - Continue present medications.                        - Post-Procedure Resumption of Antiplatelet                         Medications: Restart Plavix (clopidogrel) today 75 mg  PO daily. Refer to managing physician for further                         adjustment of therapy. Procedure Code(s):     --- Professional ---                        [phone], Colonoscopy, flexible; with control of                         bleeding, any method Diagnosis Code(s):     --- Professional ---                        K55.20, Angiodysplasia of colon without hemorrhage                        K92.1, Melena (includes Hematochezia)                        D62, Acute posthemorrhagic anemia                        K57.30, Diverticulosis of large intestine without                         perforation or abscess without bleeding CPT copyright 2022 [employer]. All rights reserved. The codes documented in this report are preliminary and upon coder review may  be revised to meet current compliance requirements. Dr. Libby Maw Toney Reil MD, MD 04/06/2023  3:14:27 PM This report has been signed electronically. Number of Addenda: 0 Note Initiated On: 04/06/2023 2:38 PM Scope Withdrawal Time: 0 hours 8 minutes 8 seconds  Total Procedure Duration: 0 hours 16 minutes 52 seconds  Estimated Blood Loss:  Estimated blood loss: none.      [facility]

## 2023-04-06 NOTE — Anesthesia Postprocedure Evaluation (Signed)
Anesthesia Post Note  Patient: [name] [name]  Procedure(s) Performed: COLONOSCOPY WITH PROPOFOL  Patient location during evaluation: PACU Anesthesia Type: General Level of consciousness: awake and alert Pain management: pain level controlled Vital Signs Assessment: post-procedure vital signs reviewed and stable Respiratory status: spontaneous breathing, nonlabored ventilation, respiratory function stable and patient connected to nasal cannula oxygen Cardiovascular status: blood pressure returned to baseline and stable Postop Assessment: no apparent nausea or vomiting Anesthetic complications: no   No notable events documented.   Last Vitals:  Vitals:   04/06/23 1327 04/06/23 1515  BP: (!) 136/59 (!) 101/34  Pulse: (!) 58   Resp: 16   Temp: 36.6 C (!) 36.2 C  SpO2: 99%     Last Pain:  Vitals:   04/06/23 1535  TempSrc:   PainSc: 1                  Yevette Edwards

## 2023-04-06 NOTE — Anesthesia Preprocedure Evaluation (Signed)
Anesthesia Evaluation  Patient identified by MRN, date of birth, ID band Patient awake    Reviewed: Allergy & Precautions, NPO status , Patient's Chart, lab work & pertinent test results  History of Anesthesia Complications (+) PONV and history of anesthetic complications  Airway Mallampati: III  TM Distance: <3 FB Neck ROM: full    Dental  (+) Chipped   Pulmonary neg shortness of breath, former smoker   Pulmonary exam normal        Cardiovascular Exercise Tolerance: Good hypertension, (-) angina + Past MI  Normal cardiovascular exam     Neuro/Psych  PSYCHIATRIC DISORDERS      negative neurological ROS     GI/Hepatic Neg liver ROS,GERD  Controlled,,  Endo/Other  negative endocrine ROS    Renal/GU Renal disease  negative genitourinary   Musculoskeletal   Abdominal   Peds  Hematology negative hematology ROS (+)   Anesthesia Other Findings Past Medical History: No date: Anemia No date: Arthritis No date: AVN of femur (HCC)     Comment:  RIGHT No date: Basal cell carcinoma     Comment:  face, legs 2005: DVT (deep venous thrombosis) (HCC)     Comment:  after hernia surgery. left leg No date: GERD (gastroesophageal reflux disease) No date: History of kidney stones No date: Hyperlipemia No date: Hypertension No date: PONV (postoperative nausea and vomiting) 09/23/2020: Presence of IVC filter 2018: Prostate cancer (HCC)     Comment:  Treated with radiation No date: Rectal bleeding     Comment:  2013, 2020  Past Surgical History: 07/02/2019: COLONOSCOPY; N/A     Comment:  Procedure: COLONOSCOPY;  Surgeon: Toney Reil,               MD;  Location: [facility] ENDOSCOPY;  Service:               Gastroenterology;  Laterality: N/A; No date: COLONOSCOPY     Comment:  2001, 2011 01/30/2023: CORONARY/GRAFT ACUTE MI REVASCULARIZATION; N/A     Comment:  Procedure: Coronary/Graft Acute MI Revascularization;                 Surgeon: Orbie Pyo, MD;  Location: [facility] INVASIVE               CV LAB;  Service: Cardiovascular;  Laterality: N/A; 04/06/2022: CYSTOSCOPY W/ RETROGRADES; Right     Comment:  Procedure: CYSTOSCOPY WITH RETROGRADE PYELOGRAM;                Surgeon: Vanna Scotland, MD;  Location: [facility] ORS;                Service: Urology;  Laterality: Right; 04/06/2022: CYSTOSCOPY/URETEROSCOPY/HOLMIUM LASER/STENT PLACEMENT;  Right     Comment:  Procedure: CYSTOSCOPY/URETEROSCOPY/HOLMIUM LASER/STENT               PLACEMENT;  Surgeon: Vanna Scotland, MD;  Location: [facility]              ORS;  Service: Urology;  Laterality: Right; 04/21/2022: CYSTOSCOPY/URETEROSCOPY/HOLMIUM LASER/STENT PLACEMENT; Right     Comment:  Procedure: CYSTOSCOPY/URETEROSCOPY/HOLMIUM LASER/STENT               EXCHANGE;  Surgeon: Riki Altes, MD;  Location: [facility]              ORS;  Service: Urology;  Laterality: Right; 07/02/2019: ESOPHAGOGASTRODUODENOSCOPY; N/A     Comment:  Procedure: ESOPHAGOGASTRODUODENOSCOPY (EGD);  Surgeon:  Toney Reil, MD;  Location: [facility] ENDOSCOPY;                Service: Gastroenterology;  Laterality: N/A; 2013: ESOPHAGOGASTRODUODENOSCOPY 2012: FOOT FRACTURE SURGERY; Right     Comment:  Heel fracture repair/ has metal, fell off ladder 2005: HERNIA REPAIR; Right     Comment:  inguinal 09/23/2020: IVC FILTER INSERTION; N/A     Comment:  Procedure: IVC FILTER INSERTION;  Surgeon: Annice Needy,              MD;  Location: [facility] INVASIVE CV LAB;  Service:               Cardiovascular;  Laterality: N/A; 11/13/2020: IVC FILTER REMOVAL; N/A     Comment:  Procedure: IVC FILTER REMOVAL;  Surgeon: Annice Needy,               MD;  Location: [facility] INVASIVE CV LAB;  Service:               Cardiovascular;  Laterality: N/A; 01/30/2023: LEFT HEART CATH AND CORONARY ANGIOGRAPHY; N/A     Comment:  Procedure: LEFT HEART CATH AND CORONARY ANGIOGRAPHY;                Surgeon: Orbie Pyo,  MD;  Location: [facility] INVASIVE               CV LAB;  Service: Cardiovascular;  Laterality: N/A; No date: TONSILLECTOMY     Comment:  as a child 10/21/2020: TOTAL HIP ARTHROPLASTY; Right     Comment:  Procedure: TOTAL HIP ARTHROPLASTY;  Surgeon: Donato Heinz, MD;  Location: [facility] ORS;  Service: Orthopedics;               Laterality: Right; 07/18/2021: XI ROBOTIC ASSISTED INGUINAL HERNIA REPAIR WITH MESH; Right     Comment:  Procedure: XI ROBOTIC ASSISTED INGUINAL HERNIA REPAIR               WITH MESH;  Surgeon: Sung Amabile, DO;  Location: [facility]               ORS;  Service: General;  Laterality: Right;  BMI    Body Mass Index: 28.34 kg/m      Reproductive/Obstetrics negative OB ROS                             Anesthesia Physical Anesthesia Plan  ASA: 3  Anesthesia Plan: General   Post-op Pain Management:    Induction: Intravenous  PONV Risk Score and Plan: Propofol infusion and TIVA  Airway Management Planned: Natural Airway and Nasal Cannula  Additional Equipment:   Intra-op Plan:   Post-operative Plan:   Informed Consent: I have reviewed the patients History and Physical, chart, labs and discussed the procedure including the risks, benefits and alternatives for the proposed anesthesia with the patient or authorized representative who has indicated his/her understanding and acceptance.     Dental Advisory Given  Plan Discussed with: Anesthesiologist, CRNA and Surgeon  Anesthesia Plan Comments: (Patient consented for risks of anesthesia including but not limited to:  - adverse reactions to medications - risk of airway placement if required - damage to eyes, teeth, lips or other oral mucosa - nerve damage due to positioning  - sore throat or hoarseness - Damage to heart, brain, nerves, lungs, other parts  of body or loss of life  Patient voiced understanding.)       Anesthesia Quick Evaluation

## 2023-04-06 NOTE — Transfer of Care (Signed)
Immediate Anesthesia Transfer of Care Note  Patient: [name] [name]  Procedure(s) Performed: COLONOSCOPY WITH PROPOFOL  Patient Location: Endoscopy Unit  Anesthesia Type:General  Level of Consciousness: drowsy  Airway & Oxygen Therapy: Patient Spontanous Breathing and Patient connected to nasal cannula oxygen  Post-op Assessment: Report given to RN and Post -op Vital signs reviewed and stable  Post vital signs: Reviewed and stable  Last Vitals:  Vitals Value Taken Time  BP 101/34 04/06/23 1515  Temp 36.2 C 04/06/23 1515  Pulse 63 04/06/23 1515  Resp 18 04/06/23 1516  SpO2 97 % 04/06/23 1515  Vitals shown include unvalidated device data.  Last Pain:  Vitals:   04/06/23 1515  TempSrc: Temporal  PainSc: Asleep         Complications: No notable events documented.

## 2023-04-07 ENCOUNTER — Encounter: Payer: Self-pay | Admitting: Gastroenterology

## 2023-04-07 DIAGNOSIS — K922 Gastrointestinal hemorrhage, unspecified: Secondary | ICD-10-CM | POA: Diagnosis not present

## 2023-04-07 LAB — HEMOGLOBIN
Hemoglobin: 8.7 g/dL — ABNORMAL LOW (ref 13.0–17.0)
Hemoglobin: 9.1 g/dL — ABNORMAL LOW (ref 13.0–17.0)

## 2023-04-07 LAB — PROTIME-INR
INR: 1.3 — ABNORMAL HIGH (ref 0.8–1.2)
Prothrombin Time: 16.3 seconds — ABNORMAL HIGH (ref 11.4–15.2)

## 2023-04-07 MED ORDER — SODIUM CHLORIDE 0.9 % IV SOLN
300.0000 mg | Freq: Once | INTRAVENOUS | Status: AC
  Administered 2023-04-07: 300 mg via INTRAVENOUS
  Filled 2023-04-07: qty 300

## 2023-04-07 MED ORDER — TRAZODONE HCL 50 MG PO TABS: 50.0000 mg | ORAL_TABLET | Freq: Every evening | ORAL | Status: DC | PRN

## 2023-04-07 NOTE — Progress Notes (Signed)
Mobility Specialist - Progress Note    04/07/23 1504  Mobility  Activity Ambulated with assistance in hallway;Ambulated independently in hallway  Level of Assistance Independent  Assistive Device None  Distance Ambulated (ft) 480 ft  Range of Motion/Exercises Active  Activity Response Tolerated well  Mobility Referral Yes  $Mobility charge 1 Mobility  Mobility Specialist Start Time (ACUTE ONLY) 1439  Mobility Specialist Stop Time (ACUTE ONLY) 1504  Mobility Specialist Time Calculation (min) (ACUTE ONLY) 25 min   Pt resting in bed on RA upon entry. Pt STS and ambulates to hallway around NS with no AD Indep. Pt returned to recliner and left with needs in reach.   Johnathan Hausen Mobility Specialist 04/07/23, 3:52 PM

## 2023-04-07 NOTE — Progress Notes (Signed)
[facility] CLINIC CARDIOLOGY PROGRESS NOTE   Patient ID: [name] [name] MRN: [id number] DOB/AGE: 78-Mar-1947 78 y.o.  Admit date: 04/02/2023 Referring Physician: Dr. Nolberto Hanlon Primary Physician: Dr. Daniel Nones Primary Cardiologist: Dr. Dorothyann Peng Reason for Consultation: GI bleed, recent NSTEMI  HPI: [name] [name] is a 77 y.o. male who presented to the ED on 04/02/2023 for rectal bleeding. They have a past medical history significant for CAD s/p PCI and DES to LAD 01/2023, recurrent DVT on chronic anticoagulation, hypertension, hyperlipidemia, anemia, prostate cancer treated with radiation, GERD. He was admitted in March for NSTEMI and underwent LHC with PCI and DES to LAD.  He was discharged home on aspirin 81 mg daily and Plavix 75 mg daily in addition to his warfarin. He presented to [facility] ED on 04/02/2023 for concerns of rectal bleeding x 1 day.  Hemoglobin in the ED noted to be 10.8 down from 12.3 2 months prior.  He was given FFP and vitamin K in the ED and anticoagulation has been held since admission.  Interval History:  -Patient reports that he is feeling quite fatigued this morning.  Also reports he had some clots in his stool again this morning. -Hemoglobin down trended to 8.7 earlier this morning.  -Plavix was restarted yesterday evening per GI recommendation after his colonoscopy did not find any evidence of active bleeding.  Review of systems complete and found to be negative unless listed above    Vitals:   04/06/23 1958 04/07/23 0017 04/07/23 0531 04/07/23 0752  BP: (!) 107/45 (!) 131/50 (!) 148/57 (!) 155/61  Pulse: 62 (!) 59 64 77  Resp: 18 16 16 18   Temp: 97.7 F (36.5 C) 97.7 F (36.5 C) 98.2 F (36.8 C) 98.5 F (36.9 C)  TempSrc:    Oral  SpO2: 97% 96% 100% 96%  Weight:      Height:         Intake/Output Summary (Last 24 hours) at 04/07/2023 1123 Last data filed at 04/07/2023 1028 Gross per 24 hour  Intake 500 ml  Output --  Net 500 ml     PHYSICAL  EXAM General: Ill-appearing elderly male laying flat in hospital bed, well nourished, in no acute distress with wife at bedside. HEENT: Normocephalic and atraumatic. Neck: No JVD.  Lungs: Normal respiratory effort on RA. Clear bilaterally to auscultation. No wheezes, crackles, rhonchi.  Heart: HRRR. Normal S1 and S2 without gallops or murmurs. Radial & DP pulses 2+ bilaterally. Abdomen: Non-distended appearing.  Msk: Normal strength and tone for age. Extremities: No clubbing, cyanosis or edema. Ted hose on both legs.  Neuro: Alert and oriented X 3. Psych: Mood appropriate, affect congruent.    LABS: Basic Metabolic Panel: Recent Labs    04/05/23 0838  NA 141  K 3.7  CL 111  CO2 25  GLUCOSE 104*  BUN 14  CREATININE 1.00  CALCIUM 8.6*   Liver Function Tests: No results for input(s): "AST", "ALT", "ALKPHOS", "BILITOT", "PROT", "ALBUMIN" in the last 72 hours.  No results for input(s): "LIPASE", "AMYLASE" in the last 72 hours. CBC: Recent Labs    04/06/23 1708 04/07/23 0107  HGB 9.2* 8.7*   Cardiac Enzymes: No results for input(s): "CKTOTAL", "CKMB", "CKMBINDEX", "TROPONINIHS" in the last 72 hours. BNP: No results for input(s): "BNP" in the last 72 hours. D-Dimer: No results for input(s): "DDIMER" in the last 72 hours. Hemoglobin A1C: No results for input(s): "HGBA1C" in the last 72 hours.  Fasting Lipid Panel: No results for input(s): "  CHOL", "HDL", "LDLCALC", "TRIG", "CHOLHDL", "LDLDIRECT" in the last 72 hours. Thyroid Function Tests: No results for input(s): "TSH", "T4TOTAL", "T3FREE", "THYROIDAB" in the last 72 hours.  Invalid input(s): "FREET3" Anemia Panel: Recent Labs    04/05/23 1627 04/06/23 0304  VITAMINB12  --  1,122*  FERRITIN 28  --   TIBC 298  --   IRON 20*  --     No results found.   ECHO 01/30/2023: 1. Large area of ant/apical/septal Akinesis.   2. Ant/apical/septal Akinesis. Left ventricular ejection fraction, by  estimation, is 35 to  40%. The left ventricle has moderately decreased  function. The left ventricle demonstrates regional wall motion  abnormalities (see scoring diagram/findings for  description). The left ventricular internal cavity size was mildly  dilated. There is mild concentric left ventricular hypertrophy. Left  ventricular diastolic parameters are consistent with Grade I diastolic  dysfunction (impaired relaxation).   3. Right ventricular systolic function is normal. The right ventricular  size is normal.   4. The mitral valve is normal in structure. Moderate to severe mitral  valve regurgitation.   5. The aortic valve is normal in structure. There is mild calcification  of the aortic valve. There is mild thickening of the aortic valve. Aortic  valve regurgitation is not visualized. Aortic valve sclerosis is present,  with no evidence of aortic valve  stenosis.    TELEMETRY reviewed by me 04/07/23: SR 80s with LBBB.   DATA reviewed by me 04/07/23: Hospitalist progress note, GI note, last 24h vitals tele labs imaging I/O  Principal Problem:   Lower GI bleeding Active Problems:   History of DVT (deep vein thrombosis)   Radiation proctitis   Blood loss anemia   NSTEMI (non-ST elevated myocardial infarction) (HCC)   Postural dizziness with presyncope   ASSESSMENT AND PLAN:  [name] [name] is a 77 y.o. male who presented to the ED on 04/02/2023 for rectal bleeding. They have a past medical history significant for CAD s/p PCI and DES to LAD 01/2023, recurrent DVT on chronic anticoagulation, hypertension, hyperlipidemia, anemia, prostate cancer treated with radiation, GERD. He was admitted in March for NSTEMI and underwent LHC with PCI and DES to LAD.  He was discharged home on aspirin 81 mg daily for 30 days and Plavix 75 mg daily in addition to his warfarin. He presented to [facility] ED on 04/02/2023 for concerns of rectal bleeding x 1 day.  Hemoglobin in the ED noted to be 10.8 down from 12.3 2 months prior.   He was given FFP and vitamin K in the ED and anticoagulation has been held since admission.  # GI Bleed # Blood Loss Anemia -Patient reports feeling very fatigued, had additional episode of clots in his stool this morning -Hemoglobin down trended to 8.7 this a.m from 10.8 yesterday. S/p FFP and Vitamin K in the ED, 2 units PRBC.  -S/p colonoscopy yesterday afternoon showed diverticulosis and angiodysplastic lesions without evidence of active bleeding. -IV PPI, IVF  # CAD s/p PCI and DES to LAD 01/2023 # Recent NSTEMI 01/2023  # Ischemic Cardiomyopathy # LBBB dx 01/2023 -Plavix restarted yesterday evening after colonoscopy per GI recommendation. -Continue carvedilol 6.25 mg twice daily. Not currently receiving home lisinopril, spironolactone, jardiance. Consider restarting if BP and renal function allow.  -Continue Repatha injections every 2 weeks and Lipitor 80 mg daily  # Hx DVT # Chronic Anticoagulation -Continue to hold warfarin, defer recommendations for restarting this to primary team given his longstanding history of recurrent  DVT.   This patient's case was discussed and created with Dr. Juliann Pares and he is in agreement.  [name] Pasquariello [place], PA-C 04/07/2023, 11:23 AM [facility] Card[facility]

## 2023-04-07 NOTE — Progress Notes (Signed)
PROGRESS NOTE    [name] [name]  VHQ:[id number] DOB: [birth year] DOA: 04/02/2023 PCP: Lynnea Ferrier, MD    Brief Narrative:  77 y.o. 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  Patient was in his usual state of health till yesterday evening.  Since getting up this morning patient has had 4 loose bowel movements that have become increasingly maroon-colored.  Last bowel movement was approximately 2 PM.  Patient has shown a photo of the last bowel movement in the bowel with some clot and maroon-colored.  Patient had no vomiting no pain no fever.  No presyncope no other site of bleeding such as gum bleeding nosebleeding or skin bruising.  Patient came to the ER for further evaluation.   Received 2 units PRBC in total.  Hemoglobin low of 7.7.  Increased to 10.2.  Drop subsequently to 9.0.  GI involved.  Plan for endoscopic evaluation 5/21.   Assessment & Plan:   Principal Problem:   Lower GI bleeding Active Problems:   NSTEMI (non-ST elevated myocardial infarction) (HCC)   Hypertension   History of DVT (deep vein thrombosis)   Radiation proctitis   Blood loss anemia   History of prostate cancer   Postural dizziness with presyncope  GI bleed Acute on chronic blood loss anemia Patient has a history of recurrent DVT on warfarin and history of CAD on Plavix.  Both his medications were taken 1 day prior to presentation.  Presented with hemoglobin of 10.8.  Patient endorses initially feeling unwell followed by dark stools then bright red blood with clots.  Hemoglobin on presentation 10.8.  Dropped to 7.7.  2 units PRBC given 5/18.  Patient received vitamin K 5 mg IV x 1 and FFP on arrival.  CT angio negative. 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 This morning hgb 8.7 from 9-10 yesterday but only trace  amount of blood in small bm this morning - iv iron today - plavix resumed - monitor  Coronary artery disease History of NSTEMI No chest pain on arrival.  No troponins checked.  Patient had NSTEMI earlier this year.  Placed on medical management with aspirin and Plavix. Plan: Plavix resumed, cardiology following  History of DVT, recurrent Will touch base w/ gi about re-starting coumadin. IVC filter would be another option  Generalized weakness Likely 2/2 above processes   DVT prophylaxis: SCDs Code Status: Full Family Communication: Spouse at bedside 5/22 Disposition Plan: Status is: Inpatient Remains inpatient appropriate because: ongoing monitoring for gi bleed   Level of care: Progressive  Consultants:  GI  Procedures:  None  Antimicrobials: None   Subjective: Seen and examined.  Feels fatigued.     Objective: Vitals:   04/07/23 0017 04/07/23 0531 04/07/23 0752 04/07/23 1242  BP: (!) 131/50 (!) 148/57 (!) 155/61 (!) 142/54  Pulse: (!) 59 64 77 73  Resp: 16 16 18 18   Temp: 97.7 F (36.5 C) 98.2 F (36.8 C) 98.5 F (36.9 C) 99.3 F (37.4 C)  TempSrc:   Oral   SpO2: 96% 100% 96% 97%  Weight:      Height:        Intake/Output Summary (Last 24 hours) at 04/07/2023 1247 Last data filed at 04/07/2023 1028 Gross per 24 hour  Intake 500 ml  Output --  Net 500 ml   [employer]  04/02/23 1710 04/03/23 0015 04/06/23 1327  Weight: 91 kg 89.6 kg 89.6 kg    Examination:  General exam: NAD.  Appears fatigued Respiratory system: Lungs clear.  Normal work of breathing.  Room air Cardiovascular system: S1-S2, RRR, no murmurs, no pedal edema Gastrointestinal system: Soft, nontender, nondistended, normal bowel sounds Central nervous system: Alert and oriented. No focal neurological deficits. Extremities: Symmetric 5 x 5 power. Skin: No rashes, lesions or ulcers Psychiatry: Judgement and insight appear normal. Mood & affect appropriate.     Data  Reviewed: I have personally reviewed following labs and imaging studies  CBC: Recent Labs  Lab 04/02/23 1706 04/02/23 2243 04/03/23 0014 04/03/23 0411 04/03/23 1251 04/05/23 1627 04/06/23 0050 04/06/23 0918 04/06/23 1708 04/07/23 0107  WBC 5.9 7.3 9.4 5.2  --   --   --   --   --   --   HGB 10.8* 10.4* 9.1* 7.7*   < > 10.1* 10.8* 9.4* 9.2* 8.7*  HCT 33.9* 32.2* 28.1* 23.6*  --   --   --   --   --   --   MCV 93.6 93.9 92.1 91.1  --   --   --   --   --   --   PLT 235 247 207 175  --   --   --   --   --   --    < > = values in this interval not displayed.   Basic Metabolic Panel: Recent Labs  Lab 04/02/23 1706 04/03/23 0411 04/05/23 0838  NA 138 137 141  K 4.6 3.8 3.7  CL 105 108 111  CO2 24 24 25   GLUCOSE 111* 95 104*  BUN 25* 25* 14  CREATININE 1.11 0.95 1.00  CALCIUM 8.9 8.2* 8.6*   GFR: Estimated Creatinine Clearance: 69.7 mL/min (by C-G formula based on SCr of 1 mg/dL). Liver Function Tests: Recent Labs  Lab 04/02/23 1706  AST 15  ALT 10  ALKPHOS 60  BILITOT 0.5  PROT 6.5  ALBUMIN 4.1   No results for input(s): "LIPASE", "AMYLASE" in the last 168 hours. No results for input(s): "AMMONIA" in the last 168 hours. Coagulation Profile: Recent Labs  Lab 04/03/23 0411 04/04/23 0406 04/05/23 0112 04/06/23 0304 04/07/23 0107  INR 1.8* 1.3* 1.3* 1.3* 1.3*   Cardiac Enzymes: No results for input(s): "CKTOTAL", "CKMB", "CKMBINDEX", "TROPONINI" in the last 168 hours. BNP (last 3 results) No results for input(s): "PROBNP" in the last 8760 hours. HbA1C: No results for input(s): "HGBA1C" in the last 72 hours.  CBG: Recent Labs  Lab 04/04/23 2036 04/05/23 0033 04/05/23 0326 04/05/23 0753 04/05/23 1135  GLUCAP 100* 99 101* 104* 138*   Lipid Profile: No results for input(s): "CHOL", "HDL", "LDLCALC", "TRIG", "CHOLHDL", "LDLDIRECT" in the last 72 hours. Thyroid Function Tests: No results for input(s): "TSH", "T4TOTAL", "FREET4", "T3FREE", "THYROIDAB" in  the last 72 hours. Anemia Panel: Recent Labs    04/05/23 1627 04/06/23 0304  VITAMINB12  --  1,122*  FERRITIN 28  --   TIBC 298  --   IRON 20*  --    Sepsis Labs: No results for input(s): "PROCALCITON", "LATICACIDVEN" in the last 168 hours.  No results found for this or any previous visit (from the past 240 hour(s)).       Radiology Studies: No results found.      Scheduled Meds:  atorvastatin  80 mg Oral Daily   carvedilol  6.25 mg Oral BID WC   clopidogrel  75 mg Oral Daily   [START ON 04/08/2023] Evolocumab  140 mg Subcutaneous Q14 Days   pantoprazole (PROTONIX) IV  40 mg Intravenous Q12H   sodium chloride flush  3 mL Intravenous Q12H   tamsulosin  0.4 mg Oral Q lunch   Continuous Infusions:  sodium chloride Stopped (04/05/23 1640)   iron sucrose 300 mg (04/07/23 1211)     LOS: 5 days      Silvano Bilis, MD [facility]   If 7PM-7AM, please contact night-coverage  04/07/2023, 12:47 PM

## 2023-04-07 NOTE — TOC Progression Note (Signed)
Transition of Care [facility]) - Progression Note    Patient Details  Name: [name] [name] MRN: [id number] Date of Birth: January 04, 1946  Transition of [facility]) CM/SW Contact  Truddie Hidden, RN Phone Number: 04/07/2023, 10:00 AM  Clinical Narrative:    Case reviewed for DME needs and changes in discharge disposition.    Expected Discharge Plan: Home/Self Care Barriers to Discharge: Continued Medical Work up  Expected Discharge Plan and Services       Living arrangements for the past 2 months: Single Family Home                                       Social Determinants of Health (SDOH) Interventions SDOH Screenings   Food Insecurity: No Food Insecurity (04/03/2023)  Housing: Patient Declined (04/03/2023)  Transportation Needs: No Transportation Needs (04/03/2023)  Utilities: Not At Risk (04/03/2023)  Tobacco Use: Medium Risk (04/06/2023)    Readmission Risk Interventions    04/26/2022   11:11 AM  Readmission Risk Prevention Plan  Transportation Screening Complete  PCP or Specialist Appt within 3-5 Days Complete  HRI or Home Care Consult Complete  Social Work Consult for Recovery Care Planning/Counseling Complete  Palliative Care Screening Not Applicable  Medication Review Oceanographer) Complete

## 2023-04-07 NOTE — Progress Notes (Signed)
[name] Repress, MD [address]  [address]  [place], [place] [address]  Main: [phone]  Fax: [phone] Pager: [phone]   Subjective: No acute events overnight.  Patient had a small bowel movement with streaks of blood only.   Objective: Vital signs in last 24 hours: Vitals:   04/07/23 1922 04/07/23 2335 04/08/23 0555 04/08/23 0809  BP: (!) 124/53 (!) 130/55 (!) 134/56 (!) 136/59  Pulse: 66 (!) 59 65 73  Resp: 16 18 16 18   Temp: 98.5 F (36.9 C) 98.5 F (36.9 C) 98.6 F (37 C) 98.2 F (36.8 C)  TempSrc:      SpO2: 96% 95% 97% 97%  Weight:      Height:       Weight change:  No intake or output data in the 24 hours ending 04/08/23 1053    Exam: Heart:: Regular rate and rhythm, S1S2 present, or without murmur or extra heart sounds Lungs: normal and clear to auscultation Abdomen: soft, nontender, normal bowel sounds   Lab Results:    Latest Ref Rng & Units 04/08/2023    6:08 AM 04/07/2023    1:40 PM 04/07/2023    1:07 AM  CBC  WBC 4.0 - 10.5 K/uL 6.6     Hemoglobin 13.0 - 17.0 g/dL 9.4  9.1  8.7   Hematocrit 39.0 - 52.0 % 28.8     Platelets 150 - 400 K/uL 180         Latest Ref Rng & Units 04/08/2023    6:08 AM 04/05/2023    8:38 AM 04/03/2023    4:11 AM  CMP  Glucose 70 - 99 mg/dL 91  324  95   BUN 8 - 23 mg/dL 13  14  25    Creatinine 0.61 - 1.24 mg/dL 4.01  0.27  2.53   Sodium 135 - 145 mmol/L 141  141  137   Potassium 3.5 - 5.1 mmol/L 3.4  3.7  3.8   Chloride 98 - 111 mmol/L 111  111  108   CO2 22 - 32 mmol/L 23  25  24    Calcium 8.9 - 10.3 mg/dL 8.2  8.6  8.2     Micro Results: No results found for this or any previous visit (from the past 240 hour(s)). Studies/Results: No results found. Medications: I have reviewed the patient's current medications. Prior to Admission:  No medications prior to admission.   Scheduled:  atorvastatin  80 mg Oral Daily   carvedilol  6.25 mg Oral BID WC   clopidogrel  75 mg Oral Daily    Evolocumab  140 mg Subcutaneous Q14 Days   pantoprazole (PROTONIX) IV  40 mg Intravenous Q12H   sodium chloride flush  3 mL Intravenous Q12H   tamsulosin  0.4 mg Oral Q lunch   Continuous:  sodium chloride Stopped (04/05/23 1640)   GUY:[id number] **OR** acetaminophen, ondansetron (ZOFRAN) IV, mouth rinse, traZODone Anti-infectives (From admission, onward)    None      Scheduled Meds:  atorvastatin  80 mg Oral Daily   carvedilol  6.25 mg Oral BID WC   clopidogrel  75 mg Oral Daily   Evolocumab  140 mg Subcutaneous Q14 Days   pantoprazole (PROTONIX) IV  40 mg Intravenous Q12H   sodium chloride flush  3 mL Intravenous Q12H   tamsulosin  0.4 mg Oral Q lunch   Continuous Infusions:  sodium chloride Stopped (04/05/23 1640)   PRN Meds:.acetaminophen **OR** acetaminophen, ondansetron (ZOFRAN) IV, mouth rinse,  traZODone   Assessment: Principal Problem:   Lower GI bleeding Active Problems:   History of DVT (deep vein thrombosis)   Radiation proctitis   Blood loss anemia   History of prostate cancer   Hypertension   NSTEMI (non-ST elevated myocardial infarction) (HCC)   Postural dizziness with presyncope  77 year old male with 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 His hemoglobin has been stable since holding anticoagulation and antiplatelet therapy. Bleeding has slowed down. Colonoscopy in 06/2019 revealed radiation proctitis, s/p APC, also with sigmoid colon diverticulosis  Plan: Hematochezia, acute blood loss anemia: Bleeding has resolved Hemoglobin is stable Colonoscopy on 5/21 revealed mild radiation proctitis in the distal rectum, treated with APC.  Sigmoid diverticulosis.  Otherwise normal colon with no evidence of fresh  blood or old blood Resume Plavix 75 mg daily Recommend to reassess the need for long-term anticoagulation before resuming Coumadin.  He has been on anticoagulation since 2006 with prior history of DVT Patient can follow-up with hematology as outpatient with regards to history of DVT in the past Anticipate home in next 24 hours S/p IV iron as inpatient  Patient can call our office as needed upon discharge   LOS: 6 days   [name] [name] 04/08/2023, 10:53 AM

## 2023-04-07 NOTE — Plan of Care (Signed)

## 2023-04-08 DIAGNOSIS — K627 Radiation proctitis: Secondary | ICD-10-CM | POA: Diagnosis not present

## 2023-04-08 DIAGNOSIS — K922 Gastrointestinal hemorrhage, unspecified: Secondary | ICD-10-CM | POA: Diagnosis not present

## 2023-04-08 DIAGNOSIS — D500 Iron deficiency anemia secondary to blood loss (chronic): Secondary | ICD-10-CM | POA: Diagnosis not present

## 2023-04-08 LAB — CBC
HCT: 28.8 % — ABNORMAL LOW (ref 39.0–52.0)
Hemoglobin: 9.4 g/dL — ABNORMAL LOW (ref 13.0–17.0)
MCH: 29.8 pg (ref 26.0–34.0)
MCHC: 32.6 g/dL (ref 30.0–36.0)
MCV: 91.4 fL (ref 80.0–100.0)
Platelets: 180 10*3/uL (ref 150–400)
RBC: 3.15 MIL/uL — ABNORMAL LOW (ref 4.22–5.81)
RDW: 14.6 % (ref 11.5–15.5)
WBC: 6.6 10*3/uL (ref 4.0–10.5)
nRBC: 0 % (ref 0.0–0.2)

## 2023-04-08 LAB — BASIC METABOLIC PANEL
Anion gap: 7 (ref 5–15)
BUN: 13 mg/dL (ref 8–23)
CO2: 23 mmol/L (ref 22–32)
Calcium: 8.2 mg/dL — ABNORMAL LOW (ref 8.9–10.3)
Chloride: 111 mmol/L (ref 98–111)
Creatinine, Ser: 1.09 mg/dL (ref 0.61–1.24)
GFR, Estimated: >60 mL/min (ref >60)
Glucose, Bld: 91 mg/dL (ref 70–99)
Potassium: 3.4 mmol/L — ABNORMAL LOW (ref 3.5–5.1)
Sodium: 141 mmol/L (ref 135–145)

## 2023-04-08 MED ORDER — CLOPIDOGREL BISULFATE 75 MG PO TABS: 75.0000 mg | ORAL_TABLET | Freq: Every day | ORAL | 1 refills | Status: AC

## 2023-04-08 MED ORDER — CLOPIDOGREL BISULFATE 75 MG PO TABS: 75.0000 mg | ORAL_TABLET | Freq: Every day | ORAL | 1 refills | Status: DC

## 2023-04-08 MED ORDER — FERROUS SULFATE 325 (65 FE) MG PO TBEC: 325.0000 mg | DELAYED_RELEASE_TABLET | ORAL | 3 refills | Status: DC

## 2023-04-08 NOTE — TOC Transition Note (Signed)
Transition of Care [facility]) - CM/SW Discharge Note   Patient Details  Name: CALEB KRENKE MRN: [id number] Date of Birth: 08/24/1946  [facility] CM/SW Contact:  Margarito Liner, LCSW Phone Number: 04/08/2023, 9:42 AM   Clinical Narrative:   Patient has orders to discharge home today. No further concerns. CSW signing off.  Final next level of care: Home/Self Care Barriers to Discharge: Barriers Resolved   Patient Goals and CMS Choice      Discharge Placement                      Patient and family notified of of transfer: 04/08/23  Discharge Plan and Services Additional resources added to the After Visit Summary for                                       Social Determinants of Health (SDOH) Interventions SDOH Screenings   Food Insecurity: No Food Insecurity (04/03/2023)  Housing: Patient Declined (04/03/2023)  Transportation Needs: No Transportation Needs (04/03/2023)  Utilities: Not At Risk (04/03/2023)  Tobacco Use: Medium Risk (04/07/2023)     Readmission Risk Interventions    04/26/2022   11:11 AM  Readmission Risk Prevention Plan  Transportation Screening Complete  PCP or Specialist Appt within 3-5 Days Complete  HRI or Home Care Consult Complete  Social Work Consult for Recovery Care Planning/Counseling Complete  Palliative Care Screening Not Applicable  Medication Review Oceanographer) Complete

## 2023-04-08 NOTE — Discharge Summary (Signed)
[name] [name] ZOX:[id number] DOB: 02-24-1946 DOA: 04/02/2023  PCP: Lynnea Ferrier, MD  Admit date: 04/02/2023 Discharge date: 04/08/2023  Time spent: 35 minutes  Recommendations for Outpatient Follow-up:  Pcp f/u, consider cbc then Hematology f/u     Discharge Diagnoses:  Principal Problem:   Lower GI bleeding Active Problems:   NSTEMI (non-ST elevated myocardial infarction) (HCC)   Hypertension   History of DVT (deep vein thrombosis)   Radiation proctitis   Blood loss anemia   History of prostate cancer   Postural dizziness with presyncope   Discharge Condition: improved  Diet recommendation: heart healthy  Filed Weights   04/02/23 1710 04/03/23 0015 04/06/23 1327  Weight: 91 kg 89.6 kg 89.6 kg    History of present illness:  From admission h and p [name] [name] is a 77 y.o. 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  Patient was in his usual state of health till yesterday evening.  Since getting up this morning patient has had 4 loose bowel movements that have become increasingly maroon-colored.  Last bowel movement was approximately 2 PM.  Patient has shown a photo of the last bowel movement in the bowel with some clot and maroon-colored.  Patient had no vomiting no pain no fever.  No presyncope no other site of bleeding such as gum bleeding nosebleeding or skin bruising.  Patient came to the ER for further evaluation.   Hospital Course:   GI bleed Acute on chronic blood loss anemia Patient has a history of recurrent DVT on warfarin and history of CAD on Plavix.  Both his medications were taken 1 day prior to presentation.  Presented with hemoglobin of 10.8.  Patient endorses initially feeling unwell followed by dark stools then bright red blood with clots.  Hemoglobin on presentation 10.8.  Dropped to 7.7.  2 units PRBC  given 5/18.  Patient received vitamin K 5 mg IV x 1 and FFP on arrival.  CT angio negative. 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 Hgb now stable in the 9s. Bleeding resolved. GI thinks bleed 2/2 diverticulosis. Received IV iron - close pcp f/u - plavix resumed - warfarin on hold   Coronary artery disease History of NSTEMI No chest pain on arrival.  No troponins checked.  Patient had NSTEMI earlier this year.  Plan: Plavix resumed, f/u cardiology  History of DVT, recurrent Discussed case w/ Dr. Racheal Patches of hematology, advises holding coumadin for now, can consider re-starting in outpatient setting at some point in the future. Hematology referral made.   Procedures: colonoscopy   Consultations: Gi, cardiology  Discharge Exam: Vitals:   04/08/23 0555 04/08/23 0809  BP: (!) 134/56 (!) 136/59  Pulse: 65 73  Resp: 16 18  Temp: 98.6 F (37 C) 98.2 F (36.8 C)  SpO2: 97% 97%    General: NAD Cardiovascular: RRR Respiratory: CTAB Abdomen: soft, non-tender  Discharge Instructions   Discharge Instructions     Ambulatory referral to Hematology / Oncology   Complete by: As directed    Diet - low sodium heart healthy   Complete by: As directed    Increase activity slowly   Complete by: As directed       Allergies as of 04/08/2023       Reactions   Rosuvastatin Other (See Comments)   Codeine Nausea Only  Sulfa Antibiotics Nausea And Vomiting        Medication List     STOP taking these medications    aspirin 81 MG chewable tablet   ondansetron 4 MG disintegrating tablet Commonly known as: ZOFRAN-ODT   oxyCODONE-acetaminophen 5-325 MG tablet Commonly known as: Percocet   phosphorus 155-852-130 MG tablet Commonly known as: K PHOS NEUTRAL   pravastatin 10 MG tablet Commonly known as: PRAVACHOL   spironolactone 25 MG tablet Commonly known as: ALDACTONE   triamterene-hydrochlorothiazide 37.5-25 MG tablet Commonly  known as: MAXZIDE-25   warfarin 3 MG tablet Commonly known as: COUMADIN   warfarin 4 MG tablet Commonly known as: COUMADIN       TAKE these medications    acetaminophen 500 MG tablet Commonly known as: TYLENOL Take 1,000 mg by mouth at bedtime as needed.   atorvastatin 80 MG tablet Commonly known as: LIPITOR Take 1 tablet (80 mg total) by mouth daily for 60 doses.   carvedilol 6.25 MG tablet Commonly known as: COREG Take 1 tablet (6.25 mg total) by mouth 2 (two) times daily with a meal.   clopidogrel 75 MG tablet Commonly known as: PLAVIX Take 1 tablet (75 mg total) by mouth daily with breakfast.   Cyanocobalamin 2500 MCG Subl Take 2,500 mcg by mouth daily. Takes at 11 am   empagliflozin 10 MG Tabs tablet Commonly known as: JARDIANCE Take 1 tablet by mouth daily.   Evolocumab 140 MG/ML Soaj Inject 140 mg into the skin. Inject 140 mg subcutaneously every 14 (fourteen) days   ferrous sulfate 325 (65 FE) MG EC tablet Take 1 tablet (325 mg total) by mouth every other day.   ipratropium 0.03 % nasal spray Commonly known as: ATROVENT Place 1 spray into the nose daily.   lisinopril 5 MG tablet Commonly known as: ZESTRIL Take 5 mg by mouth daily. What changed: Another medication with the same name was removed. Continue taking this medication, and follow the directions you see here.   nitroGLYCERIN 0.4 MG SL tablet Commonly known as: NITROSTAT Place 1 tablet (0.4 mg total) under the tongue every 5 (five) minutes as needed for up to 1 dose for chest pain.   OSTEO BI-FLEX REGULAR STRENGTH PO Take 2 tablets by mouth daily. Takes at 11 am   pantoprazole 40 MG tablet Commonly known as: PROTONIX Take 40 mg by mouth daily.   tamsulosin 0.4 MG Caps capsule Commonly known as: FLOMAX Take 0.4 mg by mouth daily with lunch.       Allergies  Allergen Reactions   Rosuvastatin Other (See Comments)   Codeine Nausea Only   Sulfa Antibiotics Nausea And Vomiting     Follow-up Information     Curtis Sites III, MD Follow up.   Specialty: Internal Medicine Contact information: [address] [facility] [place] [address] [phone]                  The results of significant diagnostics from this hospitalization (including imaging, microbiology, ancillary and laboratory) are listed below for reference.    Significant Diagnostic Studies: CT ANGIO GI BLEED  Result Date: 04/02/2023 CLINICAL DATA:  Lower GI bleed EXAM: CTA ABDOMEN AND PELVIS WITHOUT AND WITH CONTRAST TECHNIQUE: Multidetector CT imaging of the abdomen and pelvis was performed using the standard protocol during bolus administration of intravenous contrast. Multiplanar reconstructed images and MIPs were obtained and reviewed to evaluate the vascular anatomy. RADIATION DOSE REDUCTION: This exam was performed according to the departmental  dose-optimization program which includes automated exposure control, adjustment of the mA and/or kV according to patient size and/or use of iterative reconstruction technique. CONTRAST:  OMNIPAQUE IOHEXOL 350 MG/ML SOLN COMPARISON:  CTA chest abdomen and pelvis 01/30/2023 FINDINGS: VASCULAR Aorta: No aneurysm or dissection. Mixed density atherosclerotic plaque. No significant stenosis. Celiac: Calcified plaque at the origin causes mild narrowing. No aneurysm or dissection. SMA: Calcified plaque at the origin causes mild narrowing. No aneurysm or dissection. Renals: 2 right and single left renal arteries. Mild to moderate narrowing of the bilateral renal arteries secondary to atherosclerotic plaque. IMA: Patent. Inflow: Scattered atherosclerotic plaque. No significant stenosis. No aneurysm or dissection. Proximal Outflow: Patent without aneurysm or dissection. Veins: Patent portal vein and IVC. Review of the MIP images confirms the above findings. NON-VASCULAR Lower chest: No acute abnormality. Hepatobiliary: Hepatic cysts. Unremarkable  gallbladder and biliary tree. Pancreas: No acute abnormality. Spleen: Unremarkable. Adrenals/Urinary Tract: Stable adrenal glands. No urinary calculi or hydronephrosis. Unremarkable bladder. Stomach/Bowel: Sigmoid diverticulosis without evidence of diverticulitis. No evidence of active GI bleeding. No bowel wall thickening. No obstruction. Stomach is within normal limits. Lymphatic: No lymphadenopathy. Reproductive: No acute abnormality. Other: No free intraperitoneal fluid or air. Musculoskeletal: Right THA.  No acute fracture. IMPRESSION: 1. No evidence of active GI bleeding. 2. No acute abnormality in the abdomen or pelvis. 3. Sigmoid diverticulosis. Electronically Signed   By: Minerva Fester M.D.   On: 04/02/2023 23:20    Microbiology: No results found for this or any previous visit (from the past 240 hour(s)).   Labs: Basic Metabolic Panel: Recent Labs  Lab 04/02/23 1706 04/03/23 0411 04/05/23 0838 04/08/23 0608  NA 138 137 141 141  K 4.6 3.8 3.7 3.4*  CL 105 108 111 111  CO2 24 24 25 23   GLUCOSE 111* 95 104* 91  BUN 25* 25* 14 13  CREATININE 1.11 0.95 1.00 1.09  CALCIUM 8.9 8.2* 8.6* 8.2*   Liver Function Tests: Recent Labs  Lab 04/02/23 1706  AST 15  ALT 10  ALKPHOS 60  BILITOT 0.5  PROT 6.5  ALBUMIN 4.1   No results for input(s): "LIPASE", "AMYLASE" in the last 168 hours. No results for input(s): "AMMONIA" in the last 168 hours. CBC: Recent Labs  Lab 04/02/23 1706 04/02/23 2243 04/03/23 0014 04/03/23 0411 04/03/23 1251 04/06/23 0918 04/06/23 1708 04/07/23 0107 04/07/23 1340 04/08/23 0608  WBC 5.9 7.3 9.4 5.2  --   --   --   --   --  6.6  HGB 10.8* 10.4* 9.1* 7.7*   < > 9.4* 9.2* 8.7* 9.1* 9.4*  HCT 33.9* 32.2* 28.1* 23.6*  --   --   --   --   --  28.8*  MCV 93.6 93.9 92.1 91.1  --   --   --   --   --  91.4  PLT 235 247 207 175  --   --   --   --   --  180   < > = values in this interval not displayed.   Cardiac Enzymes: No results for input(s):  "CKTOTAL", "CKMB", "CKMBINDEX", "TROPONINI" in the last 168 hours. BNP: BNP (last 3 results) Recent Labs    04/25/22 0512  BNP 45.8    ProBNP (last 3 results) No results for input(s): "PROBNP" in the last 8760 hours.  CBG: Recent Labs  Lab 04/04/23 2036 04/05/23 0033 04/05/23 0326 04/05/23 0753 04/05/23 1135  GLUCAP 100* 99 101* 104* 138*  Signed:  Silvano Bilis MD.  [facility] 04/08/2023, 9:31 AM

## 2023-04-08 NOTE — Evaluation (Signed)
Physical Therapy Evaluation Patient Details Name: [name] [name] MRN: [id number] DOB: 07/10/1946 Today's Date: 04/08/2023  History of Present Illness  Pt presented to ED for potential GI bleed, s/p 2 transfusions. PMH of former smoker, HTN, past MI, HLD, prostate cancer.   Clinical Impression  Pt A&Ox4, denied pain, reported just ambulating with mobility specialist. Agreeable to PT evaluation. Per pt at baseline he is independent and very busy; cutting down trees, redoing decks, trying to walk at least 3 miles a day. The pt demonstrated all mobility independently, no apparent balance deficits. The patient demonstrated and reported return to baseline level of functioning, no further acute PT needs indicated. PT to sign off. Please reconsult PT if pt status changes or acute needs are identified.         Recommendations for follow up therapy are one component of a multi-disciplinary discharge planning process, led by the attending physician.  Recommendations may be updated based on patient status, additional functional criteria and insurance authorization.  Follow Up Recommendations       Assistance Recommended at Discharge None  Patient can return home with the following  Other (comment) (NA)    Equipment Recommendations None recommended by PT  Recommendations for Other Services       Functional Status Assessment Patient has not had a recent decline in their functional status     Precautions / Restrictions Restrictions Weight Bearing Restrictions: No      Mobility  Bed Mobility Overal bed mobility: Independent                  Transfers Overall transfer level: Independent                      Ambulation/Gait Ambulation/Gait assistance: Independent Gait Distance (Feet): 270 Feet Assistive device: None            Stairs            Wheelchair Mobility    Modified Rankin (Stroke Patients Only)       Balance Overall balance assessment: No  apparent balance deficits (not formally assessed)                                           Pertinent Vitals/Pain Pain Assessment Pain Assessment: No/denies pain    Home Living Family/patient expects to be discharged to:: Private residence Living Arrangements: Spouse/significant other Available Help at Discharge: Family Type of Home: House Home Access: Level entry       Home Layout: Multi-level;Able to live on main level with bedroom/bathroom Home Equipment: Rolling Walker (2 wheels);Cane - single point;Wheelchair - manual;BSC/3in1 Additional Comments: all DME from his mother    Prior Function Prior Level of Function : Independent/Modified Independent                     Hand Dominance        Extremity/Trunk Assessment   Upper Extremity Assessment Upper Extremity Assessment: Overall WFL for tasks assessed    Lower Extremity Assessment Lower Extremity Assessment: Overall WFL for tasks assessed    Cervical / Trunk Assessment Cervical / Trunk Assessment: Normal  Communication   Communication: No difficulties  Cognition Arousal/Alertness: Awake/alert Behavior During Therapy: WFL for tasks assessed/performed Overall Cognitive Status: Within Functional Limits for tasks assessed  General Comments      Exercises     Assessment/Plan    PT Assessment Patient does not need any further PT services  PT Problem List         PT Treatment Interventions      PT Goals (Current goals can be found in the Care Plan section)       Frequency       Co-evaluation               AM-PAC PT "6 Clicks" Mobility  Outcome Measure Help needed turning from your back to your side while in a flat bed without using bedrails?: None Help needed moving from lying on your back to sitting on the side of a flat bed without using bedrails?: None Help needed moving to and from a bed to a chair  (including a wheelchair)?: None Help needed standing up from a chair using your arms (e.g., wheelchair or bedside chair)?: None Help needed to walk in hospital room?: None Help needed climbing 3-5 steps with a railing? : None 6 Click Score: 24    End of Session   Activity Tolerance: Patient tolerated treatment well Patient left: in chair;with family/visitor present   PT Visit Diagnosis: Other abnormalities of gait and mobility (R26.89)    Time: 1610-9604 PT Time Calculation (min) (ACUTE ONLY): 10 min   Charges:   PT Evaluation $PT Eval Low Complexity: 1 Low          Olga Coaster PT, DPT 9:36 AM,04/08/23

## 2023-04-08 NOTE — Plan of Care (Signed)

## 2023-04-08 NOTE — Progress Notes (Signed)
Mobility Specialist - Progress Note   04/08/23 0849  Mobility  Activity Ambulated independently in hallway;Stood at bedside;Dangled on edge of bed  Level of Assistance Independent  Assistive Device None  Distance Ambulated (ft) 500 ft  Activity Response Tolerated well  Mobility Referral Yes  $Mobility charge 1 Mobility  Mobility Specialist Start Time (ACUTE ONLY) 0827  Mobility Specialist Stop Time (ACUTE ONLY) 0839  Mobility Specialist Time Calculation (min) (ACUTE ONLY) 12 min   Pt sitting EOB on RA upon arrival. Pt STS and ambulates in hallway indep. Pt returns to EOB with needs in reach.   Terrilyn Saver  Mobility Specialist  04/08/23 8:50 AM

## 2023-04-14 ENCOUNTER — Inpatient Hospital Stay: Payer: Medicare HMO | Attending: Oncology | Admitting: Oncology

## 2023-04-14 ENCOUNTER — Inpatient Hospital Stay: Payer: Medicare HMO

## 2023-04-14 ENCOUNTER — Encounter: Payer: Self-pay | Admitting: Oncology

## 2023-04-14 VITALS — BP 121/60 | HR 62 | Temp 96.2°F | Resp 18 | Ht 70.0 in | Wt 194.2 lb

## 2023-04-14 DIAGNOSIS — D509 Iron deficiency anemia, unspecified: Secondary | ICD-10-CM

## 2023-04-14 DIAGNOSIS — I82402 Acute embolism and thrombosis of unspecified deep veins of left lower extremity: Secondary | ICD-10-CM | POA: Diagnosis not present

## 2023-04-14 DIAGNOSIS — K5520 Angiodysplasia of colon without hemorrhage: Secondary | ICD-10-CM

## 2023-04-14 NOTE — Progress Notes (Signed)
Hematology/Oncology Consult note [facility] Telephone:([phone] [phone]  Patient Care Team: Lynnea Ferrier, MD as PCP - General (Internal Medicine)   Name of the patient: [name] [name]  [id number]  Oct 25, 1946    Reason for referral-history of DVT   Referring physician-Dr. Perrin Maltese  Date of visit: 04/14/23   History of presenting illness- Patient is a 77 year old male with a past medical history significant for hypertension hyperlipidemia CAD, history of prostate cancer in 2018.  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  He was on Plavix for his CAD.  He presented to the ER on May 2024 with anemia when his hemoglobin dropped down from 10.8-7.7.  CT angio was negative.  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  He has been referred to hematology for discussion regarding restarting Coumadin.  Patient had a history of left lower extremity DVT back in 2005 following hip surgery.  He was on anticoagulation for a year and was subsequently stopped.  It appears that he had a second episode of DVT shortly following that and patient has been on Coumadin ever since then.  I do not have records of his prior DVT.  More recently patient has been back-and-forth to the hospital 3 times with history of GI bleeding  ECOG PS- 1  Pain scale- 0   Review of systems- Review of Systems  Constitutional:  Positive for malaise/fatigue. Negative for chills, fever and weight loss.  HENT:  Negative for congestion, ear discharge and nosebleeds.   Eyes:  Negative for blurred vision.  Respiratory:  Negative for cough, hemoptysis, sputum production, shortness of breath and wheezing.   Cardiovascular:  Negative for chest pain, palpitations, orthopnea and claudication.  Gastrointestinal:  Negative for abdominal pain, blood in stool, constipation, diarrhea, heartburn, melena, nausea and vomiting.  Genitourinary:  Negative for  dysuria, flank pain, frequency, hematuria and urgency.  Musculoskeletal:  Negative for back pain, joint pain and myalgias.  Skin:  Negative for rash.  Neurological:  Negative for dizziness, tingling, focal weakness, seizures, weakness and headaches.  Endo/Heme/Allergies:  Does not bruise/bleed easily.  Psychiatric/Behavioral:  Negative for depression and suicidal ideas. The patient does not have insomnia.     Allergies  Allergen Reactions   Rosuvastatin Other (See Comments)   Codeine Nausea Only   Sulfa Antibiotics Nausea And Vomiting    Patient Active Problem List   Diagnosis Date Noted   Lower GI bleeding 04/02/2023   Postural dizziness with presyncope 04/02/2023   Coronary artery disease involving native coronary artery of native heart without angina pectoris 02/01/2023   Unstable angina (HCC) 01/30/2023   NSTEMI (non-ST elevated myocardial infarction) (HCC) 01/30/2023   S/P cardiac catheterization 01/30/2023   Other hydronephrosis    Acute UTI    Sepsis (HCC) 04/25/2022   Ureteral colic    Acute unilateral obstructive uropathy 04/06/2022   Dyslipidemia 04/06/2022   AKI (acute kidney injury) (HCC) 04/06/2022   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   Obesity (BMI 30-39.9) 04/06/2022   Anxiety 04/06/2022   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/21/2020   Blood loss anemia 07/02/2020   Hypertension 07/02/2020   History of DVT (deep vein thrombosis) 07/26/2019   Hyperlipidemia 07/26/2019   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   Prostate cancer (HCC) 2018   Treated with radiation   Rectal bleeding    2013, 2020     Past Surgical History:  Procedure Laterality Date   COLONOSCOPY N/A 07/02/2019   Procedure: COLONOSCOPY;  Surgeon: Toney Reil, MD;  Location: [facility] ENDOSCOPY;  Service: Gastroenterology;  Laterality: N/A;   COLONOSCOPY     2001, 2011   COLONOSCOPY WITH PROPOFOL N/A 04/06/2023   Procedure: COLONOSCOPY WITH PROPOFOL;  Surgeon: Toney Reil, MD;  Location: [facility] ENDOSCOPY;  Service: Gastroenterology;  Laterality: N/A;   CORONARY/GRAFT ACUTE MI REVASCULARIZATION N/A 01/30/2023   Procedure: Coronary/Graft Acute MI Revascularization;  Surgeon: Orbie Pyo, MD;  Location: [facility] INVASIVE CV LAB;  Service: Cardiovascular;  Laterality: N/A;   CYSTOSCOPY W/ RETROGRADES Right 04/06/2022   Procedure: CYSTOSCOPY WITH RETROGRADE PYELOGRAM;  Surgeon: Vanna Scotland, MD;  Location: [facility] ORS;  Service: Urology;  Laterality: Right;   CYSTOSCOPY/URETEROSCOPY/HOLMIUM LASER/STENT PLACEMENT Right 04/06/2022   Procedure: CYSTOSCOPY/URETEROSCOPY/HOLMIUM LASER/STENT PLACEMENT;  Surgeon: Vanna Scotland, MD;  Location: [facility] ORS;  Service: Urology;  Laterality: Right;   CYSTOSCOPY/URETEROSCOPY/HOLMIUM LASER/STENT PLACEMENT Right 04/21/2022   Procedure: CYSTOSCOPY/URETEROSCOPY/HOLMIUM LASER/STENT EXCHANGE;  Surgeon: Riki Altes, MD;  Location: [facility] ORS;  Service: Urology;  Laterality: Right;   ESOPHAGOGASTRODUODENOSCOPY N/A 07/02/2019   Procedure: ESOPHAGOGASTRODUODENOSCOPY (EGD);  Surgeon: Toney Reil, MD;  Location: [facility] ENDOSCOPY;  Service: Gastroenterology;  Laterality: N/A;   ESOPHAGOGASTRODUODENOSCOPY  2013   FOOT FRACTURE SURGERY  Right 2012   Heel fracture repair/ has metal, fell off ladder   HERNIA REPAIR Right 2005   inguinal   IVC FILTER INSERTION N/A 09/23/2020   Procedure: IVC FILTER INSERTION;  Surgeon: Annice Needy, MD;  Location: [facility] INVASIVE CV LAB;  Service: Cardiovascular;  Laterality: N/A;   IVC FILTER REMOVAL N/A 11/13/2020   Procedure: IVC FILTER REMOVAL;  Surgeon: Annice Needy, MD;  Location: [facility] INVASIVE CV LAB;  Service: Cardiovascular;  Laterality: N/A;   LEFT HEART CATH AND CORONARY ANGIOGRAPHY N/A 01/30/2023   Procedure: LEFT HEART CATH AND CORONARY ANGIOGRAPHY;  Surgeon: Orbie Pyo, MD;  Location: [facility] INVASIVE CV LAB;  Service: Cardiovascular;  Laterality: N/A;   TONSILLECTOMY     as a child   TOTAL HIP ARTHROPLASTY Right 10/21/2020   Procedure: TOTAL HIP ARTHROPLASTY;  Surgeon: Donato Heinz, MD;  Location: [facility] ORS;  Service: Orthopedics;  Laterality: Right;   XI ROBOTIC ASSISTED INGUINAL HERNIA REPAIR WITH MESH Right 07/18/2021   Procedure: XI ROBOTIC ASSISTED INGUINAL HERNIA REPAIR WITH MESH;  Surgeon: Sung Amabile, DO;  Location: [facility] ORS;  Service: General;  Laterality: Right;    Social History   Socioeconomic History   Marital status: Married    Spouse name: Bonita Quin   Number of children: 2   Years of education: Not on file   Highest education level: Not on file  Occupational History   Not on file  Tobacco Use   Smoking status: Former    Types: Cigarettes    Quit date: 1982    Years since quitting: 42.4   Smokeless tobacco: Never  Vaping Use   Vaping Use: Never used  Substance and Sexual Activity   Alcohol use: Not Currently   Drug use: Never   Sexual activity: Not on file  Other Topics Concern   Not on file  Social History Narrative   Not on file   Social Determinants of Health   Financial Resource Strain: Not on file  Food Insecurity: No Food Insecurity (04/03/2023)   Hunger Vital Sign    Worried About Running Out of Food in the Last Year: Never true    Ran  Out of Food in the Last Year: Never true  Transportation Needs: No Transportation Needs (04/03/2023)   PRAPARE - Administrator, Civil Service (Medical): No    Lack of Transportation (Non-Medical): No  Physical Activity: Not on file  Stress: Not on file  Social Connections: Not on file  Intimate Partner Violence: Not At Risk (04/03/2023)   Humiliation, Afraid, Rape, and Kick questionnaire    Fear of Current or Ex-Partner: No    Emotionally Abused: No    Physically Abused: No    Sexually Abused: No     Family History  Problem Relation Age of Onset   Heart attack Father      Current Outpatient Medications:    acetaminophen (TYLENOL) 500 MG tablet, Take 1,000 mg by mouth at bedtime as needed., Disp: , Rfl:    Bioflavonoid Products (BIOFLEX) TABS, Take 1 tablet by mouth 2 (two) times daily., Disp: , Rfl:    carvedilol (COREG) 6.25 MG tablet, Take 1 tablet (6.25 mg total) by mouth 2 (two) times daily with a meal., Disp: 60 tablet, Rfl: 3   Cholecalciferol 75 MCG (3000 UT) TABS, Take by mouth., Disp: , Rfl:    clopidogrel (PLAVIX) 75 MG tablet, Take 1 tablet (75 mg total) by mouth daily with breakfast., Disp: 90 tablet, Rfl: 1   Cyanocobalamin 2500 MCG SUBL, Take 2,500 mcg by mouth daily. Takes at 11 am, Disp: , Rfl:    empagliflozin (JARDIANCE) 10 MG TABS tablet, Take 1 tablet by mouth daily., Disp: , Rfl:    Evolocumab 140 MG/ML SOAJ, Inject 140 mg into the skin. Inject 140 mg subcutaneously every 14 (fourteen) days, Disp: , Rfl:    ferrous sulfate 325 (65 FE) MG EC tablet, Take 1 tablet (325 mg total) by mouth every other day., Disp: 45 tablet, Rfl: 3   Glucosamine-Chondroitin (OSTEO BI-FLEX REGULAR STRENGTH PO), Take 2 tablets by mouth daily. Takes at 11 am, Disp: , Rfl:    ipratropium (ATROVENT) 0.03 % nasal spray, Place 1 spray into the nose daily., Disp: , Rfl:    lisinopril (ZESTRIL) 5 MG tablet, Take 5 mg by mouth daily., Disp: , Rfl:    nitroGLYCERIN (NITROSTAT) 0.4 MG  SL tablet, Place 1 tablet (0.4 mg total) under the tongue every 5 (five) minutes as needed for up to 1 dose for chest pain., Disp: 1 tablet, Rfl: 0   pantoprazole (PROTONIX) 40 MG tablet, Take 40 mg by mouth daily., Disp: , Rfl:    tamsulosin (FLOMAX) 0.4 MG CAPS capsule, Take 0.4 mg by mouth daily with lunch., Disp: , Rfl:    warfarin (COUMADIN) 3 MG tablet, TAKE ONE TABLET 3MG  BY MOUTH MONDAY, TUESDAY, WEDNESDAY, THURSDAY, FRIDAY, Disp: ,  Rfl:    atorvastatin (LIPITOR) 80 MG tablet, Take 1 tablet (80 mg total) by mouth daily for 60 doses. (Patient not taking: Reported on 04/02/2023), Disp: 30 tablet, Rfl: 1   Physical exam:  Vitals:   04/14/23 1444  BP: 121/60  Pulse: 62  Resp: 18  Temp: (!) 96.2 F (35.7 C)  TempSrc: Tympanic  SpO2: 100%  Weight: 194 lb 3.2 oz (88.1 kg)  Height: 5\' 10"  (1.778 m)   Physical Exam Cardiovascular:     Rate and Rhythm: Normal rate and regular rhythm.     Heart sounds: Normal heart sounds.  Pulmonary:     Effort: Pulmonary effort is normal.     Breath sounds: Normal breath sounds.  Abdominal:     General: Bowel sounds are normal.     Palpations: Abdomen is soft.  Musculoskeletal:     Comments: B/l changes of chronic hyperpigmentation from stasis dermatitis  Skin:    General: Skin is warm and Danish.  Neurological:     Mental Status: He is alert and oriented to person, place, and time.          Latest Ref Rng & Units 04/08/2023    6:08 AM  CMP  Glucose 70 - 99 mg/dL 91   BUN 8 - 23 mg/dL 13   Creatinine 1.61 - 1.24 mg/dL 0.96   Sodium 045 - 409 mmol/L 141   Potassium 3.5 - 5.1 mmol/L 3.4   Chloride 98 - 111 mmol/L 111   CO2 22 - 32 mmol/L 23   Calcium 8.9 - 10.3 mg/dL 8.2       Latest Ref Rng & Units 04/08/2023    6:08 AM  CBC  WBC 4.0 - 10.5 K/uL 6.6   Hemoglobin 13.0 - 17.0 g/dL 9.4   Hematocrit 81.1 - 52.0 % 28.8   Platelets 150 - 400 K/uL 180     No images are attached to the encounter.  CT ANGIO GI BLEED  Result Date:  04/02/2023 CLINICAL DATA:  Lower GI bleed EXAM: CTA ABDOMEN AND PELVIS WITHOUT AND WITH CONTRAST TECHNIQUE: Multidetector CT imaging of the abdomen and pelvis was performed using the standard protocol during bolus administration of intravenous contrast. Multiplanar reconstructed images and MIPs were obtained and reviewed to evaluate the vascular anatomy. RADIATION DOSE REDUCTION: This exam was performed according to the departmental dose-optimization program which includes automated exposure control, adjustment of the mA and/or kV according to patient size and/or use of iterative reconstruction technique. CONTRAST:  OMNIPAQUE IOHEXOL 350 MG/ML SOLN COMPARISON:  CTA chest abdomen and pelvis 01/30/2023 FINDINGS: VASCULAR Aorta: No aneurysm or dissection. Mixed density atherosclerotic plaque. No significant stenosis. Celiac: Calcified plaque at the origin causes mild narrowing. No aneurysm or dissection. SMA: Calcified plaque at the origin causes mild narrowing. No aneurysm or dissection. Renals: 2 right and single left renal arteries. Mild to moderate narrowing of the bilateral renal arteries secondary to atherosclerotic plaque. IMA: Patent. Inflow: Scattered atherosclerotic plaque. No significant stenosis. No aneurysm or dissection. Proximal Outflow: Patent without aneurysm or dissection. Veins: Patent portal vein and IVC. Review of the MIP images confirms the above findings. NON-VASCULAR Lower chest: No acute abnormality. Hepatobiliary: Hepatic cysts. Unremarkable gallbladder and biliary tree. Pancreas: No acute abnormality. Spleen: Unremarkable. Adrenals/Urinary Tract: Stable adrenal glands. No urinary calculi or hydronephrosis. Unremarkable bladder. Stomach/Bowel: Sigmoid diverticulosis without evidence of diverticulitis. No evidence of active GI bleeding. No bowel wall thickening. No obstruction. Stomach is within normal limits. Lymphatic: No lymphadenopathy. Reproductive: No  acute abnormality. Other: No  free intraperitoneal fluid or air. Musculoskeletal: Right THA.  No acute fracture. IMPRESSION: 1. No evidence of active GI bleeding. 2. No acute abnormality in the abdomen or pelvis. 3. Sigmoid diverticulosis. Electronically Signed   By: Minerva Fester M.D.   On: 04/02/2023 23:20    Assessment and plan- Patient is a 77 y.o. male referred for iron deficiency anemia and history of recurrent DVT  History of recurrent DVT: Patient has probably had 2 back-to-back episodes of DVT back in 2005 in 2006.  The first episode appears to be provoked shortly following surgery.  He was on Coumadin since then which was stopped recently when he was in the hospital for GI bleed.  Given that he has had 3 back-to-back episodes of GI bleed and the most recent 1 which was severe requiring blood transfusions I would recommend holding off on any further Coumadin at this time since the risks of bleeding outweigh the risks of clotting at this time.  If he were to have another episode of DVT we will consider restarting anticoagulation if there are no further episodes of GI bleed.  I do not see a reason to do any hypercoagulable workup at this time  Iron deficiency anemia: Possibly secondary to diverticular bleed.  He also had angiodysplasias of the colon which were treated by APC.  Patient reports significant fatigue and his most recent hemoglobin upon discharge was close to 9.  I will plan to give him 5 doses of Venofer.  Repeat CBC ferritin and iron studies in 2 and 4 months and I will see him back in 4 months.  Discussed risks and benefits of IV iron including all but not limited to possible risk of infusion reaction.  Patient understands and agrees to proceed as planned   Thank you for this kind referral and the opportunity to participate in the care of this patient   Visit Diagnosis 1. 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 Iron deficiency anemia, unspecified iron deficiency anemia  type     Dr. Owens Shark, MD, MPH [facility] [phone] 04/14/2023

## 2023-04-16 ENCOUNTER — Encounter: Payer: Self-pay | Admitting: Oncology

## 2023-04-16 DIAGNOSIS — D509 Iron deficiency anemia, unspecified: Secondary | ICD-10-CM | POA: Insufficient documentation

## 2023-04-19 ENCOUNTER — Inpatient Hospital Stay: Payer: Medicare HMO | Attending: Oncology

## 2023-04-19 VITALS — BP 126/52 | HR 65 | Temp 97.4°F | Resp 18

## 2023-04-19 DIAGNOSIS — D509 Iron deficiency anemia, unspecified: Secondary | ICD-10-CM | POA: Diagnosis present

## 2023-04-19 MED ORDER — SODIUM CHLORIDE 0.9 % IV SOLN
Freq: Once | INTRAVENOUS | Status: AC
  Filled 2023-04-19: qty 250

## 2023-04-19 MED ORDER — SODIUM CHLORIDE 0.9 % IV SOLN
200.0000 mg | INTRAVENOUS | Status: DC
  Administered 2023-04-19: 200 mg via INTRAVENOUS
  Filled 2023-04-19: qty 10

## 2023-04-19 NOTE — Patient Instructions (Signed)
[facility]  Discharge Instructions: Thank you for choosing [facility] to provide your oncology and hematology care.  If you have a lab appointment with the Cancer Center, please go directly to the Cancer Center and check in at the registration area.  Wear comfortable clothing and clothing appropriate for easy access to any Portacath or PICC line.   We strive to give you quality time with your provider. You may need to reschedule your appointment if you arrive late (15 or more minutes).  Arriving late affects you and other patients whose appointments are after yours.  Also, if you miss three or more appointments without notifying the office, you may be dismissed from the clinic at the provider's discretion.      For prescription refill requests, have your pharmacy contact our office and allow 72 hours for refills to be completed.    Today you received the following chemotherapy and/or immunotherapy agents Venofer.      To help prevent nausea and vomiting after your treatment, we encourage you to take your nausea medication as directed.  BELOW ARE SYMPTOMS THAT SHOULD BE REPORTED IMMEDIATELY: *FEVER GREATER THAN 100.4 F (38 C) OR HIGHER *CHILLS OR SWEATING *NAUSEA AND VOMITING THAT IS NOT CONTROLLED WITH YOUR NAUSEA MEDICATION *UNUSUAL SHORTNESS OF BREATH *UNUSUAL BRUISING OR BLEEDING *URINARY PROBLEMS (pain or burning when urinating, or frequent urination) *BOWEL PROBLEMS (unusual diarrhea, constipation, pain near the anus) TENDERNESS IN MOUTH AND THROAT WITH OR WITHOUT PRESENCE OF ULCERS (sore throat, sores in mouth, or a toothache) UNUSUAL RASH, SWELLING OR PAIN  UNUSUAL VAGINAL DISCHARGE OR ITCHING   Items with * indicate a potential emergency and should be followed up as soon as possible or go to the Emergency Department if any problems should occur.  Please show the CHEMOTHERAPY ALERT CARD or IMMUNOTHERAPY ALERT CARD at check-in to  the Emergency Department and triage nurse.  Should you have questions after your visit or need to cancel or reschedule your appointment, please contact [facility]  [phone] and follow the prompts.  Office hours are 8:00 a.m. to 4:30 p.m. Monday - Friday. Please note that voicemails left after 4:00 p.m. may not be returned until the following business day.  We are closed weekends and major holidays. You have access to a nurse at all times for urgent questions. Please call the main number to the clinic [phone] and follow the prompts.  For any non-urgent questions, you may also contact your provider using MyChart. We now offer e-Visits for anyone 18 and older to request care online for non-urgent symptoms. For details visit [email or web address].   Also download the MyChart app! Go to the app store, search "MyChart", open the app, select [place], and log in with your MyChart username and password.   

## 2023-04-22 MED FILL — Iron Sucrose Inj 20 MG/ML (Fe Equiv): INTRAVENOUS | Qty: 10 | Status: AC

## 2023-04-23 ENCOUNTER — Inpatient Hospital Stay: Payer: Medicare HMO

## 2023-04-23 VITALS — BP 136/53 | HR 59 | Temp 97.7°F | Resp 18

## 2023-04-23 DIAGNOSIS — D509 Iron deficiency anemia, unspecified: Secondary | ICD-10-CM

## 2023-04-23 MED ORDER — SODIUM CHLORIDE 0.9 % IV SOLN
Freq: Once | INTRAVENOUS | Status: AC
  Filled 2023-04-23: qty 250

## 2023-04-23 MED ORDER — SODIUM CHLORIDE 0.9 % IV SOLN
200.0000 mg | INTRAVENOUS | Status: DC
  Administered 2023-04-23: 200 mg via INTRAVENOUS
  Filled 2023-04-23: qty 200

## 2023-04-26 ENCOUNTER — Inpatient Hospital Stay: Payer: Medicare HMO

## 2023-04-26 VITALS — BP 115/51 | HR 57 | Temp 97.1°F | Resp 16

## 2023-04-26 DIAGNOSIS — D509 Iron deficiency anemia, unspecified: Secondary | ICD-10-CM

## 2023-04-26 MED ORDER — SODIUM CHLORIDE 0.9 % IV SOLN
200.0000 mg | INTRAVENOUS | Status: DC
  Administered 2023-04-26: 200 mg via INTRAVENOUS
  Filled 2023-04-26: qty 10

## 2023-04-26 MED ORDER — SODIUM CHLORIDE 0.9 % IV SOLN
Freq: Once | INTRAVENOUS | Status: AC
  Filled 2023-04-26: qty 250

## 2023-04-26 NOTE — Progress Notes (Signed)
Pt declined the 30 min post venofer observation period.  Pt tolerated infusion well and left the infusion suite stable and ambulatory.

## 2023-04-26 NOTE — Patient Instructions (Signed)

## 2023-04-28 ENCOUNTER — Inpatient Hospital Stay: Payer: Medicare HMO

## 2023-04-28 VITALS — BP 117/48 | HR 61 | Temp 97.4°F | Resp 16

## 2023-04-28 DIAGNOSIS — D509 Iron deficiency anemia, unspecified: Secondary | ICD-10-CM

## 2023-04-28 MED ORDER — SODIUM CHLORIDE 0.9 % IV SOLN
200.0000 mg | INTRAVENOUS | Status: DC
  Administered 2023-04-28: 200 mg via INTRAVENOUS
  Filled 2023-04-28: qty 200

## 2023-04-28 MED ORDER — SODIUM CHLORIDE 0.9 % IV SOLN
Freq: Once | INTRAVENOUS | Status: AC
  Filled 2023-04-28: qty 250

## 2023-04-28 NOTE — Progress Notes (Signed)
Pt tolerated treatment without complaints.  VSS.  Pt refused 30 minute post observation period.  Pt understands risks.   

## 2023-04-28 NOTE — Patient Instructions (Signed)

## 2023-04-29 MED FILL — Iron Sucrose Inj 20 MG/ML (Fe Equiv): INTRAVENOUS | Qty: 10 | Status: AC

## 2023-04-30 ENCOUNTER — Inpatient Hospital Stay: Payer: Medicare HMO

## 2023-04-30 VITALS — BP 115/48 | HR 60 | Temp 97.2°F | Resp 17

## 2023-04-30 DIAGNOSIS — D509 Iron deficiency anemia, unspecified: Secondary | ICD-10-CM | POA: Diagnosis not present

## 2023-04-30 MED ORDER — SODIUM CHLORIDE 0.9 % IV SOLN
Freq: Once | INTRAVENOUS | Status: AC
  Filled 2023-04-30: qty 250

## 2023-04-30 MED ORDER — SODIUM CHLORIDE 0.9 % IV SOLN
200.0000 mg | INTRAVENOUS | Status: DC
  Administered 2023-04-30: 200 mg via INTRAVENOUS
  Filled 2023-04-30: qty 200

## 2023-06-11 ENCOUNTER — Other Ambulatory Visit: Payer: Self-pay

## 2023-06-11 DIAGNOSIS — D509 Iron deficiency anemia, unspecified: Secondary | ICD-10-CM

## 2023-06-14 ENCOUNTER — Inpatient Hospital Stay: Payer: Medicare HMO | Attending: Oncology

## 2023-06-14 DIAGNOSIS — D509 Iron deficiency anemia, unspecified: Secondary | ICD-10-CM | POA: Diagnosis not present

## 2023-06-14 LAB — IRON AND TIBC
Iron: 55 ug/dL (ref 45–182)
Saturation Ratios: 21 % (ref 17.9–39.5)
TIBC: 263 ug/dL (ref 250–450)
UIBC: 208 ug/dL

## 2023-06-14 LAB — FERRITIN: Ferritin: 203 ng/mL (ref 24–336)

## 2023-06-14 LAB — CBC WITH DIFFERENTIAL (CANCER CENTER ONLY)
Abs Immature Granulocytes: 0.01 10*3/uL (ref 0.00–0.07)
Basophils Absolute: 0.1 10*3/uL (ref 0.0–0.1)
Basophils Relative: 1 %
Eosinophils Absolute: 0.1 10*3/uL (ref 0.0–0.5)
Eosinophils Relative: 3 %
HCT: 39.6 % (ref 39.0–52.0)
Hemoglobin: 12.5 g/dL — ABNORMAL LOW (ref 13.0–17.0)
Immature Granulocytes: 0 %
Lymphocytes Relative: 34 %
Lymphs Abs: 1.5 10*3/uL (ref 0.7–4.0)
MCH: 29.8 pg (ref 26.0–34.0)
MCHC: 31.6 g/dL (ref 30.0–36.0)
MCV: 94.5 fL (ref 80.0–100.0)
Monocytes Absolute: 0.4 10*3/uL (ref 0.1–1.0)
Monocytes Relative: 9 %
Neutro Abs: 2.3 10*3/uL (ref 1.7–7.7)
Neutrophils Relative %: 53 %
Platelet Count: 198 10*3/uL (ref 150–400)
RBC: 4.19 MIL/uL — ABNORMAL LOW (ref 4.22–5.81)
RDW: 14.1 % (ref 11.5–15.5)
WBC Count: 4.4 10*3/uL (ref 4.0–10.5)
nRBC: 0 % (ref 0.0–0.2)

## 2023-06-14 LAB — VITAMIN B12: Vitamin B-12: 769 pg/mL (ref 180–914)

## 2023-08-13 ENCOUNTER — Other Ambulatory Visit: Payer: Self-pay

## 2023-08-13 DIAGNOSIS — D509 Iron deficiency anemia, unspecified: Secondary | ICD-10-CM

## 2023-08-16 ENCOUNTER — Inpatient Hospital Stay: Payer: Medicare HMO | Attending: Oncology

## 2023-08-16 ENCOUNTER — Encounter: Payer: Self-pay | Admitting: Oncology

## 2023-08-16 ENCOUNTER — Inpatient Hospital Stay: Payer: Medicare HMO | Admitting: Oncology

## 2023-08-16 VITALS — BP 136/67 | HR 60 | Temp 97.3°F | Resp 18 | Ht 70.0 in | Wt 185.9 lb

## 2023-08-16 DIAGNOSIS — Z7901 Long term (current) use of anticoagulants: Secondary | ICD-10-CM | POA: Insufficient documentation

## 2023-08-16 DIAGNOSIS — Z87891 Personal history of nicotine dependence: Secondary | ICD-10-CM | POA: Diagnosis not present

## 2023-08-16 DIAGNOSIS — Z8042 Family history of malignant neoplasm of prostate: Secondary | ICD-10-CM | POA: Insufficient documentation

## 2023-08-16 DIAGNOSIS — D509 Iron deficiency anemia, unspecified: Secondary | ICD-10-CM | POA: Insufficient documentation

## 2023-08-16 DIAGNOSIS — Z86718 Personal history of other venous thrombosis and embolism: Secondary | ICD-10-CM | POA: Diagnosis not present

## 2023-08-16 DIAGNOSIS — I2510 Atherosclerotic heart disease of native coronary artery without angina pectoris: Secondary | ICD-10-CM | POA: Insufficient documentation

## 2023-08-16 LAB — CBC WITH DIFFERENTIAL (CANCER CENTER ONLY)
Abs Immature Granulocytes: 0.02 10*3/uL (ref 0.00–0.07)
Basophils Absolute: 0.1 10*3/uL (ref 0.0–0.1)
Basophils Relative: 1 %
Eosinophils Absolute: 0.1 10*3/uL (ref 0.0–0.5)
Eosinophils Relative: 1 %
HCT: 39.8 % (ref 39.0–52.0)
Hemoglobin: 12.7 g/dL — ABNORMAL LOW (ref 13.0–17.0)
Immature Granulocytes: 0 %
Lymphocytes Relative: 30 %
Lymphs Abs: 1.6 10*3/uL (ref 0.7–4.0)
MCH: 30.2 pg (ref 26.0–34.0)
MCHC: 31.9 g/dL (ref 30.0–36.0)
MCV: 94.8 fL (ref 80.0–100.0)
Monocytes Absolute: 0.4 10*3/uL (ref 0.1–1.0)
Monocytes Relative: 8 %
Neutro Abs: 3.1 10*3/uL (ref 1.7–7.7)
Neutrophils Relative %: 60 %
Platelet Count: 212 10*3/uL (ref 150–400)
RBC: 4.2 MIL/uL — ABNORMAL LOW (ref 4.22–5.81)
RDW: 13.8 % (ref 11.5–15.5)
WBC Count: 5.3 10*3/uL (ref 4.0–10.5)
nRBC: 0 % (ref 0.0–0.2)

## 2023-08-16 LAB — IRON AND TIBC
Iron: 79 ug/dL (ref 45–182)
Saturation Ratios: 28 % (ref 17.9–39.5)
TIBC: 284 ug/dL (ref 250–450)
UIBC: 205 ug/dL

## 2023-08-16 LAB — FERRITIN: Ferritin: 89 ng/mL (ref 24–336)

## 2023-08-16 NOTE — Progress Notes (Signed)
Hematology/Oncology Consult note [facility]  Telephone:([phone] [phone]  Patient Care Team: Lynnea Ferrier, MD as PCP - General (Internal Medicine) Creig Hines, MD as Consulting Physician (Oncology)   Name of the patient: [name] [name]  [id number]  06/07/1946   Date of visit: 08/16/23  Diagnosis-history of DVT and iron deficiency anemia  Chief complaint/ Reason for visit-routine follow-up of iron deficiency anemia  Heme/Onc history: Patient is a 77 year old male with a past medical history significant for hypertension hyperlipidemia CAD, history of prostate cancer in 2018.  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  He was on Plavix for his CAD.  He presented to the ER on May 2024 with anemia when his hemoglobin dropped down from 10.8-7.7.  CT angio was negative.  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  He has been referred to hematology for discussion regarding restarting Coumadin.   Patient had a history of left lower extremity DVT back in 2005 following hip surgery.  He was on anticoagulation for a year and was subsequently stopped.  It appears that he had a second episode of DVT shortly following that and patient has been on Coumadin ever since then.  I do not have records of his prior DVT.  More recently patient has been back-and-forth to the hospital 3 times with history of GI bleeding.  Patient received IV iron in May 2024  Interval history-overall he reports feeling better after receiving IV iron.  Energy levels have improved although he still reports some ongoing fatigue.  He is able to do his ADLs and IADLs without any difficulty.  ECOG PS- 1 Pain scale- 0   Review of systems- Review of Systems  Constitutional:  Positive for malaise/fatigue. Negative for chills, fever and weight loss.  HENT:  Negative for congestion, ear discharge and nosebleeds.   Eyes:  Negative for  blurred vision.  Respiratory:  Negative for cough, hemoptysis, sputum production, shortness of breath and wheezing.   Cardiovascular:  Negative for chest pain, palpitations, orthopnea and claudication.  Gastrointestinal:  Negative for abdominal pain, blood in stool, constipation, diarrhea, heartburn, melena, nausea and vomiting.  Genitourinary:  Negative for dysuria, flank pain, frequency, hematuria and urgency.  Musculoskeletal:  Negative for back pain, joint pain and myalgias.  Skin:  Negative for rash.  Neurological:  Negative for dizziness, tingling, focal weakness, seizures, weakness and headaches.  Endo/Heme/Allergies:  Does not bruise/bleed easily.  Psychiatric/Behavioral:  Negative for depression and suicidal ideas. The patient does not have insomnia.       Allergies  Allergen Reactions   Rosuvastatin Other (See Comments)   Codeine Nausea Only   Sulfa Antibiotics Nausea And Vomiting     Past Medical History:  Diagnosis Date   Anemia    Arthritis    AVN of femur (HCC)    RIGHT   Basal cell carcinoma    face, legs   DVT (deep venous thrombosis) (HCC) 2005   after hernia surgery. 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   Prostate cancer (HCC) 2018   Treated with radiation   Rectal bleeding    2013, 2020     Past Surgical History:  Procedure Laterality Date   COLONOSCOPY N/A 07/02/2019  Procedure: COLONOSCOPY;  Surgeon: Toney Reil, MD;  Location: [facility] ENDOSCOPY;  Service: Gastroenterology;  Laterality: N/A;   COLONOSCOPY     2001, 2011   COLONOSCOPY WITH PROPOFOL N/A 04/06/2023   Procedure: COLONOSCOPY WITH PROPOFOL;  Surgeon: Toney Reil, MD;  Location: [facility] ENDOSCOPY;  Service: Gastroenterology;  Laterality: N/A;   CORONARY/GRAFT ACUTE MI REVASCULARIZATION N/A 01/30/2023   Procedure:  Coronary/Graft Acute MI Revascularization;  Surgeon: Orbie Pyo, MD;  Location: [facility] INVASIVE CV LAB;  Service: Cardiovascular;  Laterality: N/A;   CYSTOSCOPY W/ RETROGRADES Right 04/06/2022   Procedure: CYSTOSCOPY WITH RETROGRADE PYELOGRAM;  Surgeon: Vanna Scotland, MD;  Location: [facility] ORS;  Service: Urology;  Laterality: Right;   CYSTOSCOPY/URETEROSCOPY/HOLMIUM LASER/STENT PLACEMENT Right 04/06/2022   Procedure: CYSTOSCOPY/URETEROSCOPY/HOLMIUM LASER/STENT PLACEMENT;  Surgeon: Vanna Scotland, MD;  Location: [facility] ORS;  Service: Urology;  Laterality: Right;   CYSTOSCOPY/URETEROSCOPY/HOLMIUM LASER/STENT PLACEMENT Right 04/21/2022   Procedure: CYSTOSCOPY/URETEROSCOPY/HOLMIUM LASER/STENT EXCHANGE;  Surgeon: Riki Altes, MD;  Location: [facility] ORS;  Service: Urology;  Laterality: Right;   ESOPHAGOGASTRODUODENOSCOPY N/A 07/02/2019   Procedure: ESOPHAGOGASTRODUODENOSCOPY (EGD);  Surgeon: Toney Reil, MD;  Location: [facility] ENDOSCOPY;  Service: Gastroenterology;  Laterality: N/A;   ESOPHAGOGASTRODUODENOSCOPY  2013   FOOT FRACTURE SURGERY Right 2012   Heel fracture repair/ has metal, fell off ladder   HERNIA REPAIR Right 2005   inguinal   IVC FILTER INSERTION N/A 09/23/2020   Procedure: IVC FILTER INSERTION;  Surgeon: Annice Needy, MD;  Location: [facility] INVASIVE CV LAB;  Service: Cardiovascular;  Laterality: N/A;   IVC FILTER REMOVAL N/A 11/13/2020   Procedure: IVC FILTER REMOVAL;  Surgeon: Annice Needy, MD;  Location: [facility] INVASIVE CV LAB;  Service: Cardiovascular;  Laterality: N/A;   LEFT HEART CATH AND CORONARY ANGIOGRAPHY N/A 01/30/2023   Procedure: LEFT HEART CATH AND CORONARY ANGIOGRAPHY;  Surgeon: Orbie Pyo, MD;  Location: [facility] INVASIVE CV LAB;  Service: Cardiovascular;  Laterality: N/A;   TONSILLECTOMY     as a child   TOTAL HIP ARTHROPLASTY Right 10/21/2020   Procedure: TOTAL HIP ARTHROPLASTY;  Surgeon: Donato Heinz, MD;  Location: [facility] ORS;  Service: Orthopedics;   Laterality: Right;   XI ROBOTIC ASSISTED INGUINAL HERNIA REPAIR WITH MESH Right 07/18/2021   Procedure: XI ROBOTIC ASSISTED INGUINAL HERNIA REPAIR WITH MESH;  Surgeon: Sung Amabile, DO;  Location: [facility] ORS;  Service: General;  Laterality: Right;    Social History   Socioeconomic History   Marital status: Married    Spouse name: Bonita Quin   Number of children: 2   Years of education: Not on file   Highest education level: Not on file  Occupational History   Not on file  Tobacco Use   Smoking status: Former    Current packs/day: 0.00    Types: Cigarettes    Quit date: 1982    Years since quitting: 42.7   Smokeless tobacco: Never  Vaping Use   Vaping status: Never Used  Substance and Sexual Activity   Alcohol use: Not Currently   Drug use: Never   Sexual activity: Not on file  Other Topics Concern   Not on file  Social History Narrative   Not on file   Social Determinants of Health   Financial Resource Strain: Low Risk  (07/16/2023)   Received from [facility]   Overall Financial Resource Strain (CARDIA)    Difficulty of Paying Living Expenses: Not hard at all  Food Insecurity: No Food Insecurity (07/16/2023)   Received from  [facility]   Hunger Vital Sign    Worried About Running Out of Food in the Last Year: Never true    Ran Out of Food in the Last Year: Never true  Transportation Needs: No Transportation Needs (07/16/2023)   Received from [facility] - Transportation    In the past 12 months, has lack of transportation kept you from medical appointments or from getting medications?: No    Lack of Transportation (Non-Medical): No  Physical Activity: Not on file  Stress: Not on file  Social Connections: Not on file  Intimate Partner Violence: Not At Risk (04/14/2023)   Humiliation, Afraid, Rape, and Kick questionnaire    Fear of Current or Ex-Partner: No    Emotionally Abused: No    Physically Abused: No     Sexually Abused: No    Family History  Problem Relation Age of Onset   Heart attack Father      Current Outpatient Medications:    acetaminophen (TYLENOL) 500 MG tablet, Take 1,000 mg by mouth at bedtime as needed., Disp: , Rfl:    carvedilol (COREG) 6.25 MG tablet, Take 1 tablet (6.25 mg total) by mouth 2 (two) times daily with a meal., Disp: 60 tablet, Rfl: 3   Cholecalciferol 75 MCG (3000 UT) TABS, Take by mouth., Disp: , Rfl:    clopidogrel (PLAVIX) 75 MG tablet, Take 1 tablet (75 mg total) by mouth daily with breakfast., Disp: 90 tablet, Rfl: 1   Cyanocobalamin 2500 MCG SUBL, Take 2,500 mcg by mouth daily. Takes at 11 am, Disp: , Rfl:    empagliflozin (JARDIANCE) 10 MG TABS tablet, Take 1 tablet by mouth daily., Disp: , Rfl:    Evolocumab 140 MG/ML SOAJ, Inject 140 mg into the skin. Inject 140 mg subcutaneously every 14 (fourteen) days, Disp: , Rfl:    ferrous sulfate 325 (65 FE) MG EC tablet, Take 1 tablet (325 mg total) by mouth every other day., Disp: 45 tablet, Rfl: 3   Glucosamine-Chondroitin (OSTEO BI-FLEX REGULAR STRENGTH PO), Take 2 tablets by mouth daily. Takes at 11 am, Disp: , Rfl:    ipratropium (ATROVENT) 0.03 % nasal spray, Place 1 spray into the nose daily., Disp: , Rfl:    lisinopril (ZESTRIL) 5 MG tablet, Take 5 mg by mouth daily., Disp: , Rfl:    nitroGLYCERIN (NITROSTAT) 0.4 MG SL tablet, Place 1 tablet (0.4 mg total) under the tongue every 5 (five) minutes as needed for up to 1 dose for chest pain., Disp: 1 tablet, Rfl: 0   tamsulosin (FLOMAX) 0.4 MG CAPS capsule, Take 0.4 mg by mouth daily with lunch., Disp: , Rfl:    atorvastatin (LIPITOR) 80 MG tablet, Take 1 tablet (80 mg total) by mouth daily for 60 doses. (Patient not taking: Reported on 04/02/2023), Disp: 30 tablet, Rfl: 1   Bioflavonoid Products (BIOFLEX) TABS, Take 1 tablet by mouth 2 (two) times daily., Disp: , Rfl:    pantoprazole (PROTONIX) 40 MG tablet, Take 40 mg by mouth daily. (Patient not taking:  Reported on 08/16/2023), Disp: , Rfl:    warfarin (COUMADIN) 3 MG tablet, TAKE ONE TABLET 3MG  BY MOUTH MONDAY, TUESDAY, WEDNESDAY, THURSDAY, FRIDAY, Disp: , Rfl:   Physical exam:  Vitals:   08/16/23 1008  BP: 136/67  Pulse: 60  Resp: 18  Temp: (!) 97.3 F (36.3 C)  TempSrc: Tympanic  SpO2: 99%  Weight: 185 lb 14.4 oz (84.3 kg)  Height: 5\' 10"  (1.778 m)  Physical Exam Cardiovascular:     Rate and Rhythm: Normal rate and regular rhythm.     Heart sounds: Normal heart sounds.  Pulmonary:     Effort: Pulmonary effort is normal.  Skin:    General: Skin is warm and [place].  Neurological:     Mental Status: He is alert and oriented to person, place, and time.         Latest Ref Rng & Units 04/08/2023    6:08 AM  CMP  Glucose 70 - 99 mg/dL 91   BUN 8 - 23 mg/dL 13   Creatinine 1.61 - 1.24 mg/dL 0.96   Sodium 045 - 409 mmol/L 141   Potassium 3.5 - 5.1 mmol/L 3.4   Chloride 98 - 111 mmol/L 111   CO2 22 - 32 mmol/L 23   Calcium 8.9 - 10.3 mg/dL 8.2       Latest Ref Rng & Units 08/16/2023    9:34 AM  CBC  WBC 4.0 - 10.5 K/uL 5.3   Hemoglobin 13.0 - 17.0 g/dL 81.1   Hematocrit 91.4 - 52.0 % 39.8   Platelets 150 - 400 K/uL 212      Assessment and plan- Patient is a 77 y.o. male here for a routine follow-up of iron deficiency anemia  Hemoglobin has improved from 9.4-12.7 after receiving IV iron.  Iron studies are within normal limits.  He does not require any IV iron at this time.  CBC ferritin and iron studies in 3 and 6 months and I will see him back in 6 months.  Patient is currently off anticoagulation for his history of DVT in 2006.  He has not had any recurrent episodes of thrombosis for about 6 months now.  Given his repeated episodes of GI bleeding we will continue to keep him off anticoagulation.   Visit Diagnosis 1. Iron deficiency anemia, unspecified iron deficiency anemia type      Dr. Owens Shark, MD, MPH [facility] at [facility] [phone] 08/16/2023 3:52 PM

## 2023-11-15 ENCOUNTER — Inpatient Hospital Stay: Payer: Medicare HMO | Attending: Oncology

## 2023-11-15 DIAGNOSIS — D509 Iron deficiency anemia, unspecified: Secondary | ICD-10-CM | POA: Diagnosis present

## 2023-11-15 LAB — CBC
HCT: 40 % (ref 39.0–52.0)
Hemoglobin: 12.8 g/dL — ABNORMAL LOW (ref 13.0–17.0)
MCH: 30.6 pg (ref 26.0–34.0)
MCHC: 32 g/dL (ref 30.0–36.0)
MCV: 95.7 fL (ref 80.0–100.0)
Platelets: 217 10*3/uL (ref 150–400)
RBC: 4.18 MIL/uL — ABNORMAL LOW (ref 4.22–5.81)
RDW: 13.3 % (ref 11.5–15.5)
WBC: 5.2 10*3/uL (ref 4.0–10.5)
nRBC: 0 % (ref 0.0–0.2)

## 2023-11-15 LAB — IRON AND TIBC
Iron: 48 ug/dL (ref 45–182)
Saturation Ratios: 16 % — ABNORMAL LOW (ref 17.9–39.5)
TIBC: 295 ug/dL (ref 250–450)
UIBC: 247 ug/dL

## 2023-11-15 LAB — FERRITIN: Ferritin: 68 ng/mL (ref 24–336)

## 2024-01-15 ENCOUNTER — Encounter: Payer: Self-pay | Admitting: Oncology

## 2024-02-07 ENCOUNTER — Encounter: Payer: Self-pay | Admitting: Oncology

## 2024-02-14 ENCOUNTER — Inpatient Hospital Stay: Payer: Medicare HMO | Attending: Oncology

## 2024-02-14 ENCOUNTER — Inpatient Hospital Stay: Payer: Medicare HMO | Admitting: Oncology

## 2024-02-14 ENCOUNTER — Telehealth: Payer: Self-pay

## 2024-02-14 ENCOUNTER — Encounter: Payer: Self-pay | Admitting: Oncology

## 2024-02-14 ENCOUNTER — Other Ambulatory Visit: Payer: Self-pay | Admitting: Oncology

## 2024-02-14 VITALS — BP 145/69 | HR 63 | Temp 98.6°F | Resp 20 | Wt 192.8 lb

## 2024-02-14 DIAGNOSIS — Z85828 Personal history of other malignant neoplasm of skin: Secondary | ICD-10-CM | POA: Diagnosis not present

## 2024-02-14 DIAGNOSIS — Z86718 Personal history of other venous thrombosis and embolism: Secondary | ICD-10-CM | POA: Insufficient documentation

## 2024-02-14 DIAGNOSIS — Z8546 Personal history of malignant neoplasm of prostate: Secondary | ICD-10-CM | POA: Insufficient documentation

## 2024-02-14 DIAGNOSIS — Z87891 Personal history of nicotine dependence: Secondary | ICD-10-CM | POA: Insufficient documentation

## 2024-02-14 DIAGNOSIS — D509 Iron deficiency anemia, unspecified: Secondary | ICD-10-CM | POA: Diagnosis not present

## 2024-02-14 LAB — CBC
HCT: 38.6 % — ABNORMAL LOW (ref 39.0–52.0)
Hemoglobin: 12.3 g/dL — ABNORMAL LOW (ref 13.0–17.0)
MCH: 29.9 pg (ref 26.0–34.0)
MCHC: 31.9 g/dL (ref 30.0–36.0)
MCV: 93.9 fL (ref 80.0–100.0)
Platelets: 212 10*3/uL (ref 150–400)
RBC: 4.11 MIL/uL — ABNORMAL LOW (ref 4.22–5.81)
RDW: 13.9 % (ref 11.5–15.5)
WBC: 5 10*3/uL (ref 4.0–10.5)
nRBC: 0 % (ref 0.0–0.2)

## 2024-02-14 LAB — IRON AND TIBC
Iron: 65 ug/dL (ref 45–182)
Saturation Ratios: 21 % (ref 17.9–39.5)
TIBC: 316 ug/dL (ref 250–450)
UIBC: 251 ug/dL

## 2024-02-14 LAB — FERRITIN: Ferritin: 39 ng/mL (ref 24–336)

## 2024-02-14 NOTE — Telephone Encounter (Signed)
 Per Dr. Smith Robert "Ferritin levels are borderline low <50. Would like to know if you would be willing to come for venofer 200 mg X3 doses".  Message sent via my chart, patient has not been read as of yet  Outbound call to patient; informed of above.  Patient is willing to start Venofer, just needs orders and to be scheduled.

## 2024-02-14 NOTE — Progress Notes (Signed)
 Hematology/Oncology Consult note [facility]  [phone] [phone]  Patient Care Team: Lynnea Ferrier, MD as PCP - General (Internal Medicine) Creig Hines, MD as Consulting Physician (Oncology)   Name of the patient: [name] [name]  [id number]  12/15/1945   Date of visit: 02/14/24  Diagnosis-history of DVT and iron deficiency anemia  Chief complaint/ Reason for visit-routine follow-up of iron deficiency anemia  Heme/Onc history: Patient is a 78 year old male with a past medical history significant for hypertension hyperlipidemia CAD, history of prostate cancer in 2018.  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  He was on Plavix for his CAD.  He presented to the ER on May 2024 with anemia when his hemoglobin dropped down from 10.8-7.7.  CT angio was negative.  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  He has been referred to hematology for discussion regarding restarting Coumadin.   Patient had a history of left lower extremity DVT back in 2005 following hip surgery.  He was on anticoagulation for a year and was subsequently stopped.  It appears that he had a second episode of DVT shortly following that and patient has been on Coumadin ever since then.  I do not have records of his prior DVT.  More recently patient has been back-and-forth to the hospital 3 times with history of GI bleeding.  Patient received IV iron in May 2024    Interval history-overall he is doing well and energy levels are presently normal.  He was able to cut down 10-15 of his neighbors trees without feeling significant fatigue or shortness of breath.  Denies any blood loss in his stool or urine.  Denies any dark melanotic stools.  ECOG PS- 0 Pain scale- 0  Review of systems- Review of Systems  Constitutional:  Negative for chills, fever, malaise/fatigue and weight loss.  HENT:  Negative for congestion, ear  discharge and nosebleeds.   Eyes:  Negative for blurred vision.  Respiratory:  Negative for cough, hemoptysis, sputum production, shortness of breath and wheezing.   Cardiovascular:  Negative for chest pain, palpitations, orthopnea and claudication.  Gastrointestinal:  Negative for abdominal pain, blood in stool, constipation, diarrhea, heartburn, melena, nausea and vomiting.  Genitourinary:  Negative for dysuria, flank pain, frequency, hematuria and urgency.  Musculoskeletal:  Negative for back pain, joint pain and myalgias.  Skin:  Negative for rash.  Neurological:  Negative for dizziness, tingling, focal weakness, seizures, weakness and headaches.  Endo/Heme/Allergies:  Does not bruise/bleed easily.  Psychiatric/Behavioral:  Negative for depression and suicidal ideas. The patient does not have insomnia.       Allergies  Allergen Reactions   Rosuvastatin Other (See Comments)   Codeine Nausea Only   Sulfa Antibiotics Nausea And Vomiting     Past Medical History:  Diagnosis Date   Anemia    Arthritis    AVN of femur (HCC)    RIGHT   Basal cell carcinoma    face, legs   DVT (deep venous thrombosis) (HCC) 2005   after hernia surgery. 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   Prostate cancer (HCC) 2018   Treated with radiation   Rectal bleeding    2013, 2020     Past Surgical  History:  Procedure Laterality Date   COLONOSCOPY N/A 07/02/2019   Procedure: COLONOSCOPY;  Surgeon: Toney Reil, MD;  Location: [facility] ENDOSCOPY;  Service: Gastroenterology;  Laterality: N/A;   COLONOSCOPY     2001, 2011   COLONOSCOPY WITH PROPOFOL N/A 04/06/2023   Procedure: COLONOSCOPY WITH PROPOFOL;  Surgeon: Toney Reil, MD;  Location: [facility] ENDOSCOPY;  Service: Gastroenterology;  Laterality: N/A;   CORONARY/GRAFT ACUTE MI  REVASCULARIZATION N/A 01/30/2023   Procedure: Coronary/Graft Acute MI Revascularization;  Surgeon: Orbie Pyo, MD;  Location: [facility] INVASIVE CV LAB;  Service: Cardiovascular;  Laterality: N/A;   CYSTOSCOPY W/ RETROGRADES Right 04/06/2022   Procedure: CYSTOSCOPY WITH RETROGRADE PYELOGRAM;  Surgeon: Vanna Scotland, MD;  Location: [facility] ORS;  Service: Urology;  Laterality: Right;   CYSTOSCOPY/URETEROSCOPY/HOLMIUM LASER/STENT PLACEMENT Right 04/06/2022   Procedure: CYSTOSCOPY/URETEROSCOPY/HOLMIUM LASER/STENT PLACEMENT;  Surgeon: Vanna Scotland, MD;  Location: [facility] ORS;  Service: Urology;  Laterality: Right;   CYSTOSCOPY/URETEROSCOPY/HOLMIUM LASER/STENT PLACEMENT Right 04/21/2022   Procedure: CYSTOSCOPY/URETEROSCOPY/HOLMIUM LASER/STENT EXCHANGE;  Surgeon: Riki Altes, MD;  Location: [facility] ORS;  Service: Urology;  Laterality: Right;   ESOPHAGOGASTRODUODENOSCOPY N/A 07/02/2019   Procedure: ESOPHAGOGASTRODUODENOSCOPY (EGD);  Surgeon: Toney Reil, MD;  Location: [facility] ENDOSCOPY;  Service: Gastroenterology;  Laterality: N/A;   ESOPHAGOGASTRODUODENOSCOPY  2013   FOOT FRACTURE SURGERY Right 2012   Heel fracture repair/ has metal, fell off ladder   HERNIA REPAIR Right 2005   inguinal   IVC FILTER INSERTION N/A 09/23/2020   Procedure: IVC FILTER INSERTION;  Surgeon: Annice Needy, MD;  Location: [facility] INVASIVE CV LAB;  Service: Cardiovascular;  Laterality: N/A;   IVC FILTER REMOVAL N/A 11/13/2020   Procedure: IVC FILTER REMOVAL;  Surgeon: Annice Needy, MD;  Location: [facility] INVASIVE CV LAB;  Service: Cardiovascular;  Laterality: N/A;   LEFT HEART CATH AND CORONARY ANGIOGRAPHY N/A 01/30/2023   Procedure: LEFT HEART CATH AND CORONARY ANGIOGRAPHY;  Surgeon: Orbie Pyo, MD;  Location: [facility] INVASIVE CV LAB;  Service: Cardiovascular;  Laterality: N/A;   TONSILLECTOMY     as a child   TOTAL HIP ARTHROPLASTY Right 10/21/2020   Procedure: TOTAL HIP ARTHROPLASTY;  Surgeon: Donato Heinz, MD;   Location: [facility] ORS;  Service: Orthopedics;  Laterality: Right;   XI ROBOTIC ASSISTED INGUINAL HERNIA REPAIR WITH MESH Right 07/18/2021   Procedure: XI ROBOTIC ASSISTED INGUINAL HERNIA REPAIR WITH MESH;  Surgeon: Sung Amabile, DO;  Location: [facility] ORS;  Service: General;  Laterality: Right;    Social History   Socioeconomic History   Marital status: Married    Spouse name: Bonita Quin   Number of children: 2   Years of education: Not on file   Highest education level: Not on file  Occupational History   Not on file  Tobacco Use   Smoking status: Former    Current packs/day: 0.00    Types: Cigarettes    Quit date: 1982    Years since quitting: 43.2   Smokeless tobacco: Never  Vaping Use   Vaping status: Never Used  Substance and Sexual Activity   Alcohol use: Not Currently   Drug use: Never   Sexual activity: Not on file  Other Topics Concern   Not on file  Social History Narrative   Not on file   Social Drivers of Health   Financial Resource Strain: Low Risk  (01/20/2024)   Received from [facility]   Overall Financial Resource Strain (CARDIA)    Difficulty of Paying Living Expenses: Not hard at  all  Food Insecurity: No Food Insecurity (01/20/2024)   Received from [facility]   Hunger Vital Sign    Worried About Running Out of Food in the Last Year: Never true    Ran Out of Food in the Last Year: Never true  Transportation Needs: No Transportation Needs (01/20/2024)   Received from [facility] - Transportation    In the past 12 months, has lack of transportation kept you from medical appointments or from getting medications?: No    Lack of Transportation (Non-Medical): No  Physical Activity: Not on file  Stress: Not on file  Social Connections: Not on file  Intimate Partner Violence: Not At Risk (04/14/2023)   Humiliation, Afraid, Rape, and Kick questionnaire    Fear of Current or Ex-Partner: No    Emotionally  Abused: No    Physically Abused: No    Sexually Abused: No    Family History  Problem Relation Age of Onset   Heart attack Father      Current Outpatient Medications:    acetaminophen (TYLENOL) 500 MG tablet, Take 1,000 mg by mouth at bedtime as needed., Disp: , Rfl:    atorvastatin (LIPITOR) 80 MG tablet, Take 1 tablet (80 mg total) by mouth daily for 60 doses. (Patient not taking: Reported on 04/02/2023), Disp: 30 tablet, Rfl: 1   Bioflavonoid Products (BIOFLEX) TABS, Take 1 tablet by mouth 2 (two) times daily., Disp: , Rfl:    carvedilol (COREG) 6.25 MG tablet, Take 1 tablet (6.25 mg total) by mouth 2 (two) times daily with a meal., Disp: 60 tablet, Rfl: 3   Cholecalciferol 75 MCG (3000 UT) TABS, Take by mouth., Disp: , Rfl:    clopidogrel (PLAVIX) 75 MG tablet, Take 1 tablet (75 mg total) by mouth daily with breakfast., Disp: 90 tablet, Rfl: 1   Cyanocobalamin 2500 MCG SUBL, Take 2,500 mcg by mouth daily. Takes at 11 am, Disp: , Rfl:    empagliflozin (JARDIANCE) 10 MG TABS tablet, Take 1 tablet by mouth daily., Disp: , Rfl:    Evolocumab 140 MG/ML SOAJ, Inject 140 mg into the skin. Inject 140 mg subcutaneously every 14 (fourteen) days, Disp: , Rfl:    ferrous sulfate 325 (65 FE) MG EC tablet, Take 1 tablet (325 mg total) by mouth every other day., Disp: 45 tablet, Rfl: 3   Glucosamine-Chondroitin (OSTEO BI-FLEX REGULAR STRENGTH PO), Take 2 tablets by mouth daily. Takes at 11 am, Disp: , Rfl:    ipratropium (ATROVENT) 0.03 % nasal spray, Place 1 spray into the nose daily., Disp: , Rfl:    lisinopril (ZESTRIL) 5 MG tablet, Take 5 mg by mouth daily., Disp: , Rfl:    nitroGLYCERIN (NITROSTAT) 0.4 MG SL tablet, Place 1 tablet (0.4 mg total) under the tongue every 5 (five) minutes as needed for up to 1 dose for chest pain., Disp: 1 tablet, Rfl: 0   pantoprazole (PROTONIX) 40 MG tablet, Take 40 mg by mouth daily. (Patient not taking: Reported on 08/16/2023), Disp: , Rfl:    tamsulosin (FLOMAX)  0.4 MG CAPS capsule, Take 0.4 mg by mouth daily with lunch., Disp: , Rfl:    warfarin (COUMADIN) 3 MG tablet, TAKE ONE TABLET 3MG  BY MOUTH MONDAY, TUESDAY, WEDNESDAY, THURSDAY, FRIDAY, Disp: , Rfl:   Physical exam:  Vitals:   02/14/24 0940  BP: (!) 145/69  Pulse: 63  Resp: 20  Temp: 98.6 F (37 C)  SpO2: 100%  Weight: 192 lb 12.8  oz (87.5 kg)   Physical Exam Cardiovascular:     Rate and Rhythm: Normal rate and regular rhythm.     Heart sounds: Normal heart sounds.  Pulmonary:     Effort: Pulmonary effort is normal.     Breath sounds: Normal breath sounds.  Abdominal:     General: Bowel sounds are normal.     Palpations: Abdomen is soft.  Skin:    General: Skin is warm and [place].  Neurological:     Mental Status: He is alert and oriented to person, place, and time.     I have personally reviewed labs listed below:    Latest Ref Rng & Units 04/08/2023    6:08 AM  CMP  Glucose 70 - 99 mg/dL 91   BUN 8 - 23 mg/dL 13   Creatinine 1.61 - 1.24 mg/dL 0.96   Sodium 045 - 409 mmol/L 141   Potassium 3.5 - 5.1 mmol/L 3.4   Chloride 98 - 111 mmol/L 111   CO2 22 - 32 mmol/L 23   Calcium 8.9 - 10.3 mg/dL 8.2       Latest Ref Rng & Units 02/14/2024    9:30 AM  CBC  WBC 4.0 - 10.5 K/uL 5.0   Hemoglobin 13.0 - 17.0 g/dL 81.1   Hematocrit 91.4 - 52.0 % 38.6   Platelets 150 - 400 K/uL 212      Assessment and plan- Patient is a 78 y.o. male here for routine follow-up of iron deficiency anemia  Patient last received IV iron in June 2024.  Ferritin and iron studies from today are pending.  Hemoglobin remained stable around 12.  White count and platelets are normal.  I will repeat CBC ferritin and iron studies in 4 and 8 months and see him back in 8 months.  Patient had a prior history of DVT in 2006 and was on Coumadin since then.  He had multiple hospitalizations for GI bleeding in 2024 and his anticoagulation was stopped at that time.  Patient has not had any repeated episodes of  DVT since then and will therefore remain off anticoagulation at this time.   Visit Diagnosis 1. Iron deficiency anemia, unspecified iron deficiency anemia type      Dr. Owens Shark, MD, MPH [facility] at [facility] [phone] 02/14/2024 9:37 AM

## 2024-02-14 NOTE — Progress Notes (Signed)
 Outbound call made to patient; informed of above.  See telephone note

## 2024-02-15 ENCOUNTER — Encounter: Payer: Self-pay | Admitting: Oncology

## 2024-02-18 ENCOUNTER — Inpatient Hospital Stay: Attending: Oncology

## 2024-02-18 VITALS — BP 143/61 | HR 61 | Temp 97.2°F | Resp 18

## 2024-02-18 DIAGNOSIS — D509 Iron deficiency anemia, unspecified: Secondary | ICD-10-CM | POA: Insufficient documentation

## 2024-02-18 MED ORDER — SODIUM CHLORIDE 0.9% FLUSH
10.0000 mL | Freq: Once | INTRAVENOUS | Status: AC | PRN
  Administered 2024-02-18: 10 mL
  Filled 2024-02-18: qty 10

## 2024-02-18 MED ORDER — IRON SUCROSE 20 MG/ML IV SOLN
200.0000 mg | INTRAVENOUS | Status: DC
  Administered 2024-02-18: 200 mg via INTRAVENOUS
  Filled 2024-02-18: qty 10

## 2024-02-21 ENCOUNTER — Encounter: Payer: Self-pay | Admitting: Oncology

## 2024-02-23 ENCOUNTER — Inpatient Hospital Stay

## 2024-02-23 VITALS — BP 144/62 | HR 59 | Temp 96.5°F | Resp 18

## 2024-02-23 DIAGNOSIS — D509 Iron deficiency anemia, unspecified: Secondary | ICD-10-CM | POA: Diagnosis not present

## 2024-02-23 MED ORDER — IRON SUCROSE 20 MG/ML IV SOLN
200.0000 mg | INTRAVENOUS | Status: DC
  Administered 2024-02-23: 200 mg via INTRAVENOUS

## 2024-02-23 MED ORDER — SODIUM CHLORIDE 0.9% FLUSH
10.0000 mL | Freq: Once | INTRAVENOUS | Status: AC | PRN
  Administered 2024-02-23: 10 mL
  Filled 2024-02-23: qty 10

## 2024-02-29 ENCOUNTER — Inpatient Hospital Stay

## 2024-02-29 VITALS — BP 146/61 | HR 62 | Temp 95.7°F | Resp 18

## 2024-02-29 DIAGNOSIS — D509 Iron deficiency anemia, unspecified: Secondary | ICD-10-CM

## 2024-02-29 MED ORDER — IRON SUCROSE 20 MG/ML IV SOLN
200.0000 mg | INTRAVENOUS | Status: DC
  Administered 2024-02-29: 200 mg via INTRAVENOUS

## 2024-02-29 MED ORDER — SODIUM CHLORIDE 0.9% FLUSH
10.0000 mL | Freq: Once | INTRAVENOUS | Status: AC | PRN
  Administered 2024-02-29: 10 mL
  Filled 2024-02-29: qty 10

## 2024-03-26 ENCOUNTER — Encounter: Payer: Self-pay | Admitting: Oncology

## 2024-03-28 ENCOUNTER — Encounter: Payer: Self-pay | Admitting: Oncology

## 2024-03-28 MED ORDER — FERROUS SULFATE 325 (65 FE) MG PO TBEC: 325.0000 mg | DELAYED_RELEASE_TABLET | ORAL | 3 refills | Status: AC

## 2024-03-28 NOTE — Telephone Encounter (Signed)
 [place] to refill

## 2024-06-15 ENCOUNTER — Inpatient Hospital Stay: Attending: Oncology

## 2024-06-15 DIAGNOSIS — D509 Iron deficiency anemia, unspecified: Secondary | ICD-10-CM | POA: Insufficient documentation

## 2024-06-15 LAB — CBC WITH DIFFERENTIAL (CANCER CENTER ONLY)
Abs Immature Granulocytes: 0.02 K/uL (ref 0.00–0.07)
Basophils Absolute: 0.1 K/uL (ref 0.0–0.1)
Basophils Relative: 1 %
Eosinophils Absolute: 0.1 K/uL (ref 0.0–0.5)
Eosinophils Relative: 2 %
HCT: 41.7 % (ref 39.0–52.0)
Hemoglobin: 13.5 g/dL (ref 13.0–17.0)
Immature Granulocytes: 0 %
Lymphocytes Relative: 26 %
Lymphs Abs: 1.8 K/uL (ref 0.7–4.0)
MCH: 30 pg (ref 26.0–34.0)
MCHC: 32.4 g/dL (ref 30.0–36.0)
MCV: 92.7 fL (ref 80.0–100.0)
Monocytes Absolute: 0.5 K/uL (ref 0.1–1.0)
Monocytes Relative: 7 %
Neutro Abs: 4.4 K/uL (ref 1.7–7.7)
Neutrophils Relative %: 64 %
Platelet Count: 211 K/uL (ref 150–400)
RBC: 4.5 MIL/uL (ref 4.22–5.81)
RDW: 13.4 % (ref 11.5–15.5)
WBC Count: 6.9 K/uL (ref 4.0–10.5)
nRBC: 0 % (ref 0.0–0.2)

## 2024-06-15 LAB — IRON AND TIBC
Iron: 45 ug/dL (ref 45–182)
Saturation Ratios: 15 % — ABNORMAL LOW (ref 17.9–39.5)
TIBC: 293 ug/dL (ref 250–450)
UIBC: 248 ug/dL

## 2024-06-15 LAB — FERRITIN: Ferritin: 58 ng/mL (ref 24–336)

## 2024-10-18 ENCOUNTER — Inpatient Hospital Stay: Attending: Oncology

## 2024-10-18 ENCOUNTER — Inpatient Hospital Stay: Admitting: Nurse Practitioner

## 2024-10-18 VITALS — BP 157/67 | HR 59 | Resp 18 | Ht 70.0 in | Wt 195.0 lb

## 2024-10-18 DIAGNOSIS — D509 Iron deficiency anemia, unspecified: Secondary | ICD-10-CM

## 2024-10-18 LAB — CBC WITH DIFFERENTIAL (CANCER CENTER ONLY)
Abs Immature Granulocytes: 0.02 K/uL (ref 0.00–0.07)
Basophils Absolute: 0.1 K/uL (ref 0.0–0.1)
Basophils Relative: 2 %
Eosinophils Absolute: 0.2 K/uL (ref 0.0–0.5)
Eosinophils Relative: 3 %
HCT: 42.2 % (ref 39.0–52.0)
Hemoglobin: 13.8 g/dL (ref 13.0–17.0)
Immature Granulocytes: 0 %
Lymphocytes Relative: 26 %
Lymphs Abs: 1.4 K/uL (ref 0.7–4.0)
MCH: 30.3 pg (ref 26.0–34.0)
MCHC: 32.7 g/dL (ref 30.0–36.0)
MCV: 92.7 fL (ref 80.0–100.0)
Monocytes Absolute: 0.5 K/uL (ref 0.1–1.0)
Monocytes Relative: 9 %
Neutro Abs: 3.2 K/uL (ref 1.7–7.7)
Neutrophils Relative %: 60 %
Platelet Count: 212 K/uL (ref 150–400)
RBC: 4.55 MIL/uL (ref 4.22–5.81)
RDW: 13.5 % (ref 11.5–15.5)
WBC Count: 5.3 K/uL (ref 4.0–10.5)
nRBC: 0 % (ref 0.0–0.2)

## 2024-10-18 LAB — IRON AND TIBC
Iron: 100 ug/dL (ref 45–182)
Saturation Ratios: 32 % (ref 17.9–39.5)
TIBC: 314 ug/dL (ref 250–450)
UIBC: 214 ug/dL

## 2024-10-18 LAB — FERRITIN: Ferritin: 106 ng/mL (ref 24–336)

## 2024-10-18 NOTE — Progress Notes (Signed)
 Hematology/Oncology Consult note [facility]  [phone] [phone]  Patient Care Team: Fernande Ophelia JINNY DOUGLAS, MD as PCP - General (Internal Medicine) Melanee Annah BROCKS, MD as Consulting Physician (Oncology)   Name of the patient: [name] [name]  [id number]  02-Mar-1946   Date of visit: 10/18/24  Diagnosis-history of DVT and iron  deficiency anemia  Chief complaint/ Reason for visit-routine follow-up of iron  deficiency anemia  Heme/Onc history: Patient is a 78 year old male with a past medical history significant for hypertension hyperlipidemia CAD, history of prostate cancer in 2018.  He has a history of recurrent DVT and was on Coumadin  for the same.  He was on Plavix  for his CAD.  He presented to the ER on May 2024 with anemia when his hemoglobin dropped down from 10.8-7.7.  CT angio was negative.  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   Patient had a history of left lower extremity DVT back in 2005 following hip surgery.  He was on anticoagulation for a year and was subsequently stopped.  It appears that he had a second episode of DVT shortly following that and patient has been on Coumadin  ever since then.  I do not have records of his prior DVT.  More recently patient has been back-and-forth to the hospital 3 times with history of GI bleeding.  Patient received IV iron  in May 2024.  No longer on anticoagulation no s/sx of recurrent DVT.    Interval history-overall he is doing well and energy levels are presently normal.  Denies any dizziness, shortness of breath, or fatigue.  Denies any blood loss in his stool or urine.  Denies any dark melanotic stools.  ECOG PS- 0 Pain scale- 0  Review of systems- Review of Systems  Constitutional:  Negative for chills, fever, malaise/fatigue and weight loss.  HENT:  Negative for congestion, ear discharge and nosebleeds.   Eyes:  Negative for blurred vision.  Respiratory:   Negative for cough, hemoptysis, sputum production, shortness of breath and wheezing.   Cardiovascular:  Negative for chest pain, palpitations, orthopnea and claudication.  Gastrointestinal:  Negative for abdominal pain, blood in stool, constipation, diarrhea, heartburn, melena, nausea and vomiting.  Genitourinary:  Negative for dysuria, flank pain, frequency, hematuria and urgency.  Musculoskeletal:  Negative for back pain, joint pain and myalgias.  Skin:  Negative for rash.  Neurological:  Negative for dizziness, tingling, focal weakness, seizures, weakness and headaches.  Endo/Heme/Allergies:  Does not bruise/bleed easily.  Psychiatric/Behavioral:  Negative for depression and suicidal ideas. The patient does not have insomnia.       Allergies  Allergen Reactions   Rosuvastatin  Other (See Comments)   Codeine Nausea Only   Sulfa Antibiotics Nausea And Vomiting     Past Medical History:  Diagnosis Date   Anemia    Arthritis    AVN of femur (HCC)    RIGHT   Basal cell carcinoma    face, legs   DVT (deep venous thrombosis) (HCC) 2005   after hernia surgery. 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   Prostate cancer (HCC) 2018   Treated with radiation   Rectal bleeding    2013, 2020     Past Surgical History:  Procedure Laterality Date   COLONOSCOPY N/A 07/02/2019   Procedure:  COLONOSCOPY;  Surgeon: Unk Corinn Skiff, MD;  Location: [facility] ENDOSCOPY;  Service: Gastroenterology;  Laterality: N/A;   COLONOSCOPY     2001, 2011   COLONOSCOPY WITH PROPOFOL  N/A 04/06/2023   Procedure: COLONOSCOPY WITH PROPOFOL ;  Surgeon: Unk Corinn Skiff, MD;  Location: [facility] ENDOSCOPY;  Service: Gastroenterology;  Laterality: N/A;   CORONARY/GRAFT ACUTE MI REVASCULARIZATION N/A 01/30/2023   Procedure: Coronary/Graft Acute MI Revascularization;   Surgeon: Wendel Lurena POUR, MD;  Location: [facility] INVASIVE CV LAB;  Service: Cardiovascular;  Laterality: N/A;   CYSTOSCOPY W/ RETROGRADES Right 04/06/2022   Procedure: CYSTOSCOPY WITH RETROGRADE PYELOGRAM;  Surgeon: Penne Knee, MD;  Location: [facility] ORS;  Service: Urology;  Laterality: Right;   CYSTOSCOPY/URETEROSCOPY/HOLMIUM LASER/STENT PLACEMENT Right 04/06/2022   Procedure: CYSTOSCOPY/URETEROSCOPY/HOLMIUM LASER/STENT PLACEMENT;  Surgeon: Penne Knee, MD;  Location: [facility] ORS;  Service: Urology;  Laterality: Right;   CYSTOSCOPY/URETEROSCOPY/HOLMIUM LASER/STENT PLACEMENT Right 04/21/2022   Procedure: CYSTOSCOPY/URETEROSCOPY/HOLMIUM LASER/STENT EXCHANGE;  Surgeon: Twylla Glendia BROCKS, MD;  Location: [facility] ORS;  Service: Urology;  Laterality: Right;   ESOPHAGOGASTRODUODENOSCOPY N/A 07/02/2019   Procedure: ESOPHAGOGASTRODUODENOSCOPY (EGD);  Surgeon: Unk Corinn Skiff, MD;  Location: [facility] ENDOSCOPY;  Service: Gastroenterology;  Laterality: N/A;   ESOPHAGOGASTRODUODENOSCOPY  2013   FOOT FRACTURE SURGERY Right 2012   Heel fracture repair/ has metal, fell off ladder   HERNIA REPAIR Right 2005   inguinal   IVC FILTER INSERTION N/A 09/23/2020   Procedure: IVC FILTER INSERTION;  Surgeon: Marea Selinda RAMAN, MD;  Location: [facility] INVASIVE CV LAB;  Service: Cardiovascular;  Laterality: N/A;   IVC FILTER REMOVAL N/A 11/13/2020   Procedure: IVC FILTER REMOVAL;  Surgeon: Marea Selinda RAMAN, MD;  Location: [facility] INVASIVE CV LAB;  Service: Cardiovascular;  Laterality: N/A;   LEFT HEART CATH AND CORONARY ANGIOGRAPHY N/A 01/30/2023   Procedure: LEFT HEART CATH AND CORONARY ANGIOGRAPHY;  Surgeon: Wendel Lurena POUR, MD;  Location: [facility] INVASIVE CV LAB;  Service: Cardiovascular;  Laterality: N/A;   TONSILLECTOMY     as a child   TOTAL HIP ARTHROPLASTY Right 10/21/2020   Procedure: TOTAL HIP ARTHROPLASTY;  Surgeon: Mardee Lynwood SQUIBB, MD;  Location: [facility] ORS;  Service: Orthopedics;  Laterality: Right;   XI ROBOTIC ASSISTED INGUINAL  HERNIA REPAIR WITH MESH Right 07/18/2021   Procedure: XI ROBOTIC ASSISTED INGUINAL HERNIA REPAIR WITH MESH;  Surgeon: Tye Millet, DO;  Location: [facility] ORS;  Service: General;  Laterality: Right;    Social History   Socioeconomic History   Marital status: Married    Spouse name: Rock   Number of children: 2   Years of education: Not on file   Highest education level: Not on file  Occupational History   Not on file  Tobacco Use   Smoking status: Former    Current packs/day: 0.00    Types: Cigarettes    Quit date: 1982    Years since quitting: 43.9   Smokeless tobacco: Never  Vaping Use   Vaping status: Never Used  Substance and Sexual Activity   Alcohol use: Not Currently   Drug use: Never   Sexual activity: Not on file  Other Topics Concern   Not on file  Social History Narrative   Not on file   Social Drivers of Health   Financial Resource Strain: Low Risk  (07/14/2024)   Received from [facility]   Overall Financial Resource Strain (CARDIA)    Difficulty of Paying Living Expenses: Not hard at all  Food Insecurity: No Food Insecurity (07/14/2024)   [facility]   Hunger Vital Sign    Within the past 12 months, you worried that your food would run out before you got the money to buy more.: Never true    Within the past 12 months, the food you bought just didn't last and you didn't have money to get more.: Never true  Transportation Needs: No Transportation Needs (07/14/2024)   Received from [facility] - Transportation    In the past 12 months, has lack of transportation kept you from medical appointments or from getting medications?: No    Lack of Transportation (Non-Medical): No  Physical Activity: Not on file  Stress: Not on file  Social Connections: Not on file  Intimate Partner Violence: Not At Risk (04/14/2023)   Humiliation, Afraid, Rape, and Kick questionnaire    Fear of Current or  Ex-Partner: No    Emotionally Abused: No    Physically Abused: No    Sexually Abused: No    Family History  Problem Relation Age of Onset   Heart attack Father      Current Outpatient Medications:    acetaminophen  (TYLENOL ) 500 MG tablet, Take 1,000 mg by mouth at bedtime as needed., Disp: , Rfl:    carvedilol  (COREG ) 6.25 MG tablet, Take 1 tablet (6.25 mg total) by mouth 2 (two) times daily with a meal., Disp: 60 tablet, Rfl: 3   Cholecalciferol 75 MCG (3000 UT) TABS, Take by mouth., Disp: , Rfl:    clopidogrel  (PLAVIX ) 75 MG tablet, Take 1 tablet (75 mg total) by mouth daily with breakfast., Disp: 90 tablet, Rfl: 1   Cyanocobalamin  2500 MCG SUBL, Take 2,500 mcg by mouth daily. Takes at 11 am, Disp: , Rfl:    eplerenone (INSPRA) 25 MG tablet, Take 12.5 mg by mouth daily., Disp: , Rfl:    Evolocumab  140 MG/ML SOAJ, Inject 140 mg into the skin. Inject 140 mg subcutaneously every 14 (fourteen) days, Disp: , Rfl:    ferrous sulfate  325 (65 FE) MG EC tablet, Take 1 tablet (325 mg total) by mouth every other day., Disp: 45 tablet, Rfl: 3   Glucosamine-Chondroitin (OSTEO BI-FLEX REGULAR STRENGTH PO), Take 2 tablets by mouth daily. Takes at 11 am, Disp: , Rfl:    nitroGLYCERIN  (NITROSTAT ) 0.4 MG SL tablet, Place 1 tablet (0.4 mg total) under the tongue every 5 (five) minutes as needed for up to 1 dose for chest pain., Disp: 1 tablet, Rfl: 0   tamsulosin  (FLOMAX ) 0.4 MG CAPS capsule, Take 0.4 mg by mouth daily with lunch., Disp: , Rfl:   Physical exam:  Vitals:   10/18/24 1017 10/18/24 1021  BP:  (!) 157/67  Pulse:  (!) 59  Resp:  18  Weight: 195 lb (88.5 kg)   Height: 5' 10 (1.778 m)    Physical Exam Cardiovascular:     Rate and Rhythm: Normal rate and regular rhythm.     Heart sounds: Normal heart sounds.  Pulmonary:     Effort: Pulmonary effort is normal.     Breath sounds: Normal breath sounds.  Abdominal:     General: Bowel sounds are normal.     Palpations: Abdomen is soft.   Skin:    General: Skin is warm and [place].  Neurological:     Mental Status: He is alert and oriented to person, place, and time.      I have personally reviewed labs listed below:    Latest Ref Rng & Units 04/08/2023  6:08 AM  CMP  Glucose 70 - 99 mg/dL 91   BUN 8 - 23 mg/dL 13   Creatinine 9.38 - 1.24 mg/dL 8.90   Sodium 864 - 854 mmol/L 141   Potassium 3.5 - 5.1 mmol/L 3.4   Chloride 98 - 111 mmol/L 111   CO2 22 - 32 mmol/L 23   Calcium  8.9 - 10.3 mg/dL 8.2       Latest Ref Rng & Units 10/18/2024   10:07 AM  CBC  WBC 4.0 - 10.5 K/uL 5.3   Hemoglobin 13.0 - 17.0 g/dL 86.1   Hematocrit 60.9 - 52.0 % 42.2   Platelets 150 - 400 K/uL 212    Lab Results  Component Value Date   IRON  100 10/18/2024   TIBC 314 10/18/2024   FERRITIN 106 10/18/2024     Assessment and plan- Patient is a 78 y.o. male here for routine follow-up of iron  deficiency anemia  Patient last received IV iron  in June 2024.  Ferritin and iron  studies from today stable.  Hemoglobin remained stable around 13.8.  White count and platelets are normal.  No venofer  today.  Repeat labs in 5 months.  Return to clinic in 10 mths for repeat labs and possible venofer .  Patient had a prior history of DVT in 2006 and was on Coumadin  since then.  He had multiple hospitalizations for GI bleeding in 2024 and his anticoagulation was stopped at that time.  Patient has not had any repeated episodes of DVT since then and will therefore remain off anticoagulation at this time.  No s/sx of recurrent DVT this visit.   Visit Diagnosis 1. Iron  deficiency anemia, unspecified iron  deficiency anemia type    Follow up plan: No venofer  today F/U in 5 mths cbc, ferritin, iron  and TIBC lab only F/U in 10 mth cbc, ferritin, iron  and TIBC see md/np possible venofer  LP     Morna Husband AGNP-C [facility] [phone] 10/18/2024 9:06 PM

## 2025-03-20 ENCOUNTER — Inpatient Hospital Stay

## 2025-08-22 ENCOUNTER — Inpatient Hospital Stay

## 2025-08-22 ENCOUNTER — Inpatient Hospital Stay: Admitting: Oncology
